# Patient Record
Sex: Male | Born: 1970 | Race: White | Hispanic: No | Marital: Single | Smoking: Current every day smoker
Health system: Southern US, Community
[De-identification: ages and names within clinical notes are randomized; demographics above are authoritative.]

## PROBLEM LIST (undated history)

## (undated) DIAGNOSIS — J45909 Unspecified asthma, uncomplicated: Secondary | ICD-10-CM

## (undated) DIAGNOSIS — I10 Essential (primary) hypertension: Secondary | ICD-10-CM

## (undated) HISTORY — PX: JOINT REPLACEMENT: SHX530

---

## 1999-05-24 ENCOUNTER — Emergency Department (HOSPITAL_COMMUNITY): Admission: EM | Admit: 1999-05-24 | Discharge: 1999-05-24 | Payer: Self-pay

## 1999-05-29 ENCOUNTER — Emergency Department (HOSPITAL_COMMUNITY): Admission: EM | Admit: 1999-05-29 | Discharge: 1999-05-29 | Payer: Self-pay | Admitting: Emergency Medicine

## 1999-09-21 ENCOUNTER — Emergency Department (HOSPITAL_COMMUNITY): Admission: EM | Admit: 1999-09-21 | Discharge: 1999-09-21 | Payer: Self-pay | Admitting: Internal Medicine

## 2001-01-22 ENCOUNTER — Encounter: Payer: Self-pay | Admitting: Emergency Medicine

## 2001-01-22 ENCOUNTER — Emergency Department (HOSPITAL_COMMUNITY): Admission: AC | Admit: 2001-01-22 | Discharge: 2001-01-23 | Payer: Self-pay

## 2001-01-23 ENCOUNTER — Encounter: Payer: Self-pay | Admitting: Emergency Medicine

## 2001-05-14 ENCOUNTER — Inpatient Hospital Stay (HOSPITAL_COMMUNITY): Admission: EM | Admit: 2001-05-14 | Discharge: 2001-05-17 | Payer: Self-pay | Admitting: Psychiatry

## 2008-11-15 ENCOUNTER — Encounter: Admission: RE | Admit: 2008-11-15 | Discharge: 2008-12-19 | Payer: Self-pay | Admitting: Rheumatology
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
## 2010-12-06 NOTE — Discharge Summary (Signed)
[facility]  Patient:    Austin Brown, Austin Brown Visit Number: [id number] MRN: [id number]          Service Type: PSY Location: 500 [id number] Attending Physician:  Rachael Fee Admit Date:  05/14/2001 Discharge Date: 05/17/2001                             Discharge Summary  IDENTIFYING DATA:  This is a 40 year old single white male voluntarily admitted for alcohol dependence requiring detoxification for medical safety, history of positive withdrawal symptoms, and alcohol impairing his ability to function and to care for his family.  MEDICATIONS: 1. Allegra. 2. Vioxx.  ALLERGIES:   AMOXICILLIN  PHYSICAL EXAMINATION:  Essentially within normal limits  LABORATORY DATA:  Essentially within normal limits except for an alcohol level of 304 then 215 then 171 prior to admission.  CMET: Within normal limits. Urine drug screen: Negative.  CBC: Within normal limits.  MENTAL STATUS EXAMINATION:  The patient was an alert, young adult male neatly dressed, somewhat intoxicated, cooperative.  Speech: Slightly slow.  Mood: Mostly pleasant.  Affect: Full.  Thought process: Goal directed.  Thought content: Negative for psychotic symptoms.  There are no suicidal, homicidal, or violent ideations.  Judgment and insight were fair to poor by history, good by formal testing.  Cognitive: Intact.  ADMITTING DIAGNOSES: Axis I:    Alcohol dependence. Axis II:   None. Axis III:  1. Asthma.            2. Ankle pain. Axis IV:   Moderate problems with primary support and other psychosocial            problems. Axis V:    40/75.  HOSPITAL COURSE:  The patient was admitted with routine p.r.n. medications. Medical medications were resumed including Allegra.  He was placed on a phenobarbital detoxification protocol and monitored for safety due to severity of alcohol abuse and history of withdrawal symptoms.  Lexapro was started for a history of mild anxiety and he tolerated this  well without side effects.  He tolerated the detoxification without significant complications.  CONDITION AT DISCHARGE:  Improved, successfully detoxified showing no evidence of withdrawal.  Mood: Euthymic.  Affect: Full.  Thought process: Goal directed.  Thought content: Negative for psychotic symptoms.  No dangerous ideation.  Judgment and insight had improved.  He reported motivation to follow up with treatment recommendations.  DISPOSITION:  He was discharged to follow up with Dr. Dub Mikes on December 5 at 3:15 and [facility] as well as attend AA.  MEDICATIONS:  He was discharged with prescriptions for: 1. Phenobarbital 15 mg one tonight and one in the a.m. and then finish. 2. Lexapro 10 mg one half q.a.m.  DISCHARGE DIAGNOSES: Axis I:    1. Alcohol dependence.            2. Status post detoxification.            3. Anxiety disorder, not otherwise specified. Axis II:   None. Axis III:  1. Asthma.            2. Ankle pain. Axis IV:   Moderate problems with primary support and other psychosocial            problems. Axis V:    55 at the time of discharge. Attending Physician:  Rachael Fee DD:  06/22/01 TD:  06/22/01 Job: [id number]

## 2014-01-29 ENCOUNTER — Emergency Department (HOSPITAL_COMMUNITY): Payer: BC Managed Care – PPO

## 2014-01-29 ENCOUNTER — Encounter (HOSPITAL_COMMUNITY): Payer: Self-pay | Admitting: Emergency Medicine

## 2014-01-29 ENCOUNTER — Emergency Department (HOSPITAL_COMMUNITY)
Admission: EM | Admit: 2014-01-29 | Discharge: 2014-01-29 | Disposition: A | Discharge status: Home / Self Care | Payer: BC Managed Care – PPO | Attending: Emergency Medicine | Admitting: Emergency Medicine

## 2014-01-29 DIAGNOSIS — S62113A Displaced fracture of triquetrum [cuneiform] bone, unspecified wrist, initial encounter for closed fracture: Secondary | ICD-10-CM | POA: Insufficient documentation

## 2014-01-29 DIAGNOSIS — IMO0002 Reserved for concepts with insufficient information to code with codable children: Secondary | ICD-10-CM | POA: Insufficient documentation

## 2014-01-29 DIAGNOSIS — F17200 Nicotine dependence, unspecified, uncomplicated: Secondary | ICD-10-CM | POA: Insufficient documentation

## 2014-01-29 DIAGNOSIS — S01512A Laceration without foreign body of oral cavity, initial encounter: Secondary | ICD-10-CM

## 2014-01-29 DIAGNOSIS — T1490XA Injury, unspecified, initial encounter: Secondary | ICD-10-CM

## 2014-01-29 DIAGNOSIS — S62112A Displaced fracture of triquetrum [cuneiform] bone, left wrist, initial encounter for closed fracture: Secondary | ICD-10-CM

## 2014-01-29 DIAGNOSIS — Z23 Encounter for immunization: Secondary | ICD-10-CM | POA: Insufficient documentation

## 2014-01-29 DIAGNOSIS — S01119A Laceration without foreign body of unspecified eyelid and periocular area, initial encounter: Secondary | ICD-10-CM | POA: Insufficient documentation

## 2014-01-29 DIAGNOSIS — Y9289 Other specified places as the place of occurrence of the external cause: Secondary | ICD-10-CM | POA: Insufficient documentation

## 2014-01-29 DIAGNOSIS — S01111A Laceration without foreign body of right eyelid and periocular area, initial encounter: Secondary | ICD-10-CM

## 2014-01-29 DIAGNOSIS — S0181XA Laceration without foreign body of other part of head, initial encounter: Secondary | ICD-10-CM

## 2014-01-29 DIAGNOSIS — S01501A Unspecified open wound of lip, initial encounter: Secondary | ICD-10-CM | POA: Insufficient documentation

## 2014-01-29 DIAGNOSIS — J45909 Unspecified asthma, uncomplicated: Secondary | ICD-10-CM | POA: Insufficient documentation

## 2014-01-29 DIAGNOSIS — S01502A Unspecified open wound of oral cavity, initial encounter: Secondary | ICD-10-CM | POA: Insufficient documentation

## 2014-01-29 DIAGNOSIS — F1010 Alcohol abuse, uncomplicated: Secondary | ICD-10-CM

## 2014-01-29 DIAGNOSIS — I10 Essential (primary) hypertension: Secondary | ICD-10-CM | POA: Insufficient documentation

## 2014-01-29 DIAGNOSIS — Y9389 Activity, other specified: Secondary | ICD-10-CM | POA: Insufficient documentation

## 2014-01-29 HISTORY — DX: Unspecified asthma, uncomplicated: J45.909

## 2014-01-29 HISTORY — DX: Essential (primary) hypertension: I10

## 2014-01-29 LAB — CBC
HCT: 43.2 % (ref 39.0–52.0)
Hemoglobin: 14.2 g/dL (ref 13.0–17.0)
MCH: 34.1 pg — ABNORMAL HIGH (ref 26.0–34.0)
MCHC: 32.9 g/dL (ref 30.0–36.0)
MCV: 103.8 fL — ABNORMAL HIGH (ref 78.0–100.0)
PLATELETS: 162 10*3/uL (ref 150–400)
RBC: 4.16 MIL/uL — ABNORMAL LOW (ref 4.22–5.81)
RDW: 13 % (ref 11.5–15.5)
WBC: 6.8 10*3/uL (ref 4.0–10.5)

## 2014-01-29 LAB — COMPREHENSIVE METABOLIC PANEL
ALBUMIN: 4.3 g/dL (ref 3.5–5.2)
ALT: 86 U/L — ABNORMAL HIGH (ref 0–53)
ANION GAP: 19 — AB (ref 5–15)
AST: 101 U/L — ABNORMAL HIGH (ref 0–37)
Alkaline Phosphatase: 91 U/L (ref 39–117)
BILIRUBIN TOTAL: 0.3 mg/dL (ref 0.3–1.2)
BUN: 11 mg/dL (ref 6–23)
CO2: 24 mEq/L (ref 19–32)
CREATININE: 0.69 mg/dL (ref 0.50–1.35)
Calcium: 9.1 mg/dL (ref 8.4–10.5)
Chloride: 103 mEq/L (ref 96–112)
GFR calc non Af Amer: >90 mL/min (ref >90)
Glucose, Bld: 131 mg/dL — ABNORMAL HIGH (ref 70–99)
Potassium: 4.1 mEq/L (ref 3.7–5.3)
Sodium: 146 mEq/L (ref 137–147)
Total Protein: 7.7 g/dL (ref 6.0–8.3)

## 2014-01-29 LAB — PROTIME-INR
INR: 0.93 (ref 0.00–1.49)
PROTHROMBIN TIME: 12.5 s (ref 11.6–15.2)

## 2014-01-29 LAB — CDS SEROLOGY

## 2014-01-29 LAB — SAMPLE TO BLOOD BANK

## 2014-01-29 LAB — ETHANOL: ALCOHOL ETHYL (B): 314 mg/dL — AB (ref 0–11)

## 2014-01-29 MED ORDER — OXYCODONE-ACETAMINOPHEN 5-325 MG PO TABS: 2.0000 | ORAL_TABLET | ORAL | Status: DC | PRN

## 2014-01-29 MED ORDER — TETANUS-DIPHTH-ACELL PERTUSSIS 5-2.5-18.5 LF-MCG/0.5 IM SUSP
0.5000 mL | Freq: Once | INTRAMUSCULAR | Status: AC
  Administered 2014-01-29: 0.5 mL via INTRAMUSCULAR
  Filled 2014-01-29: qty 0.5

## 2014-01-29 MED ORDER — MORPHINE SULFATE 4 MG/ML IJ SOLN
4.0000 mg | Freq: Once | INTRAMUSCULAR | Status: AC
  Administered 2014-01-29: 4 mg via INTRAVENOUS
  Filled 2014-01-29: qty 1
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
## 2014-01-29 NOTE — Discharge Instructions (Signed)
Absorbable Suture Repair Absorbable sutures (stitches) hold skin together so you can heal. Keep skin wounds clean and dry for the next 2 to 3 days. Then, you may gently wash your wound and dress it with an antibiotic ointment as recommended. As your wound begins to heal, the sutures are no longer needed, and they typically begin to fall off. This will take 7 to 10 days. After 10 days, if your sutures are loose, you can remove them by wiping with a clean gauze pad or a cotton ball. Do not pull your sutures out. They should wipe away easily. If after 10 days they do not easily wipe away, have your caregiver take them out. Absorbable sutures may be used deep in a wound to help hold it together. If these stitches are below the skin, the body will absorb them completely in 3 to 4 weeks.  You may need a tetanus shot if:  You cannot remember when you had your last tetanus shot.  You have never had a tetanus shot. If you get a tetanus shot, your arm may swell, get red, and feel warm to the touch. This is common and not a problem. If you need a tetanus shot and you choose not to have one, there is a rare chance of getting tetanus. Sickness from tetanus can be serious. SEEK IMMEDIATE MEDICAL CARE IF:  You have redness in the wound area.  The wound area feels hot to the touch.  You develop swelling in the wound area.  You develop pain.  There is fluid drainage from the wound. Document Released: 08/14/2004 Document Revised: 09/29/2011 Document Reviewed: 11/26/2010 [facility] Patient Information 2015 [place], [place]. This information is not intended to replace advice given to you by your health care provider. Make sure you discuss any questions you have with your health care provider.

## 2014-01-29 NOTE — Progress Notes (Signed)
Orthopedic Tech Progress Note Patient Details:  Austin Brown February 08, 1971 [id number]  Ortho Devices Type of Ortho Device: Short arm splint Ortho Device/Splint Location: short arm volar splint applied to the patients left upper extemity. wear and care of the splint is gone over verbally with patient.   Ashok Cordia 01/29/2014, 4:30 AM
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
## 2014-08-27 ENCOUNTER — Emergency Department (HOSPITAL_COMMUNITY): Payer: BLUE CROSS/BLUE SHIELD

## 2014-08-27 ENCOUNTER — Emergency Department (HOSPITAL_COMMUNITY)
Admission: EM | Admit: 2014-08-27 | Discharge: 2014-08-27 | Disposition: A | Discharge status: Home / Self Care | Payer: BLUE CROSS/BLUE SHIELD | Attending: Emergency Medicine | Admitting: Emergency Medicine

## 2014-08-27 ENCOUNTER — Encounter (HOSPITAL_COMMUNITY): Payer: Self-pay

## 2014-08-27 DIAGNOSIS — Z79899 Other long term (current) drug therapy: Secondary | ICD-10-CM | POA: Insufficient documentation

## 2014-08-27 DIAGNOSIS — I10 Essential (primary) hypertension: Secondary | ICD-10-CM | POA: Insufficient documentation

## 2014-08-27 DIAGNOSIS — F10120 Alcohol abuse with intoxication, uncomplicated: Secondary | ICD-10-CM | POA: Diagnosis present

## 2014-08-27 DIAGNOSIS — F1010 Alcohol abuse, uncomplicated: Secondary | ICD-10-CM

## 2014-08-27 DIAGNOSIS — R000 Tachycardia, unspecified: Secondary | ICD-10-CM | POA: Diagnosis not present

## 2014-08-27 DIAGNOSIS — J45909 Unspecified asthma, uncomplicated: Secondary | ICD-10-CM | POA: Insufficient documentation

## 2014-08-27 DIAGNOSIS — Z720 Tobacco use: Secondary | ICD-10-CM | POA: Insufficient documentation

## 2014-08-27 DIAGNOSIS — Z880 Allergy status to penicillin: Secondary | ICD-10-CM | POA: Diagnosis not present

## 2014-08-27 DIAGNOSIS — F10920 Alcohol use, unspecified with intoxication, uncomplicated: Secondary | ICD-10-CM

## 2014-08-27 LAB — URINALYSIS, ROUTINE W REFLEX MICROSCOPIC
Bilirubin Urine: NEGATIVE
Glucose, UA: NEGATIVE mg/dL
Ketones, ur: NEGATIVE mg/dL
Leukocytes, UA: NEGATIVE
Nitrite: NEGATIVE
Protein, ur: NEGATIVE mg/dL
SPECIFIC GRAVITY, URINE: 1.006 (ref 1.005–1.030)
UROBILINOGEN UA: 0.2 mg/dL (ref 0.0–1.0)
pH: 5 (ref 5.0–8.0)

## 2014-08-27 LAB — COMPREHENSIVE METABOLIC PANEL
ALK PHOS: 108 U/L (ref 39–117)
ALT: 126 U/L — AB (ref 0–53)
AST: 132 U/L — ABNORMAL HIGH (ref 0–37)
Albumin: 5 g/dL (ref 3.5–5.2)
Anion gap: 11 (ref 5–15)
BILIRUBIN TOTAL: 0.7 mg/dL (ref 0.3–1.2)
BUN: 11 mg/dL (ref 6–23)
CHLORIDE: 105 mmol/L (ref 96–112)
CO2: 25 mmol/L (ref 19–32)
Calcium: 9.5 mg/dL (ref 8.4–10.5)
Creatinine, Ser: 0.79 mg/dL (ref 0.50–1.35)
Glucose, Bld: 110 mg/dL — ABNORMAL HIGH (ref 70–99)
Potassium: 4.4 mmol/L (ref 3.5–5.1)
Sodium: 141 mmol/L (ref 135–145)
TOTAL PROTEIN: 8.9 g/dL — AB (ref 6.0–8.3)

## 2014-08-27 LAB — CBC
HCT: 45.1 % (ref 39.0–52.0)
Hemoglobin: 14.4 g/dL (ref 13.0–17.0)
MCH: 33.6 pg (ref 26.0–34.0)
MCHC: 31.9 g/dL (ref 30.0–36.0)
MCV: 105.1 fL — ABNORMAL HIGH (ref 78.0–100.0)
Platelets: 141 10*3/uL — ABNORMAL LOW (ref 150–400)
RBC: 4.29 MIL/uL (ref 4.22–5.81)
RDW: 13.8 % (ref 11.5–15.5)
WBC: 6.7 10*3/uL (ref 4.0–10.5)

## 2014-08-27 LAB — RAPID URINE DRUG SCREEN, HOSP PERFORMED
AMPHETAMINES: NOT DETECTED
Barbiturates: NOT DETECTED
Benzodiazepines: NOT DETECTED
COCAINE: NOT DETECTED
OPIATES: NOT DETECTED
TETRAHYDROCANNABINOL: NOT DETECTED

## 2014-08-27 LAB — ACETAMINOPHEN LEVEL: Acetaminophen (Tylenol), Serum: <10 ug/mL — ABNORMAL LOW (ref 10–30)

## 2014-08-27 LAB — SALICYLATE LEVEL: Salicylate Lvl: <4 mg/dL (ref 2.8–20.0)

## 2014-08-27 LAB — URINE MICROSCOPIC-ADD ON

## 2014-08-27 LAB — ETHANOL: ALCOHOL ETHYL (B): 464 mg/dL — AB (ref 0–9)

## 2014-08-27 MED ORDER — THIAMINE HCL 100 MG/ML IJ SOLN: 100.0000 mg | Freq: Every day | INTRAMUSCULAR | Status: DC

## 2014-08-27 MED ORDER — LORAZEPAM 2 MG/ML IJ SOLN: 0.0000 mg | Freq: Two times a day (BID) | INTRAMUSCULAR | Status: DC

## 2014-08-27 MED ORDER — LORAZEPAM 2 MG/ML IJ SOLN
0.0000 mg | Freq: Four times a day (QID) | INTRAMUSCULAR | Status: DC
Start: 2014-08-28 — End: 2014-08-28

## 2014-08-27 MED ORDER — VITAMIN B-1 100 MG PO TABS: 100.0000 mg | ORAL_TABLET | Freq: Every day | ORAL | Status: DC

## 2014-08-27 NOTE — ED Notes (Signed)
EDP made aware-patient is ambulatory with no assist

## 2014-08-27 NOTE — ED Notes (Signed)
Patient reports he is ready to get help with his alcohol addiction.  Currently intoxicated.  He reports he has been drinking since 1000.

## 2014-08-27 NOTE — BH Assessment (Signed)
Assessment completed. Consulted Darlyne Russian, PA-C who recommended inpatient treatment once pt's BAL is below 200.

## 2014-08-27 NOTE — ED Provider Notes (Signed)
CSN: [id number]     Arrival date & time 08/27/14  2109 History  This chart was scribed for non-physician practitioner, Rodolph Bong, PA-C, working with Malvin Johns, MD by Lowella Petties, ED Scribe. The patient was seen in room WTR4/WLPT4. Patient's care was started at 10:10 PM.   Chief Complaint  Patient presents with  . Alcohol Intoxication  . Alcohol Problem   The history is provided by the patient. No language interpreter was used.    Level 5 Caveat: Alcohol Intoxication  HPI Comments: Austin Brown is a 44 y.o. male who presents to the Emergency Department for alcohol detox. He states that he has consumed two bottles of wine every day for the past year. He takes Xanax every morning. He denies any previous attempts to detox or any withdrawal symptoms. He states that he vomits most every morning from anxiety, but he denies vomiting today. He denies any hallucinations, suicidal or homicidal ideations.  Patient also states that he fell on Friday, he does not remember if he had any loss of consciousness. He denies chest pain, SOB, and hematemesis.   Past Medical History  Diagnosis Date  . Asthma   . Hypertension    History reviewed. No pertinent past surgical history. History reviewed. No pertinent family history. History  Substance Use Topics  . Smoking status: Current Every Day Smoker -- 1.50 packs/day    Types: Cigarettes  . Smokeless tobacco: Current User  . Alcohol Use: 4.8 oz/week    8 Glasses of wine per week    Review of Systems  Unable to perform ROS Level 5 Caveat: Alcohol Intoxication   Allergies  Amoxicillin  Home Medications   Prior to Admission medications   Medication Sig Start Date End Date Taking? Authorizing Provider  alprazolam Duanne Moron) 2 MG tablet Take 1 mg by mouth at bedtime as needed for sleep.    Yes Historical Provider, MD  fexofenadine (ALLEGRA) 180 MG tablet Take 180 mg by mouth daily.   Yes Historical Provider, MD  oxyCODONE-acetaminophen  (PERCOCET/ROXICET) 5-325 MG per tablet Take 2 tablets by mouth every 4 (four) hours as needed for severe pain. Patient not taking: Reported on 08/27/2014 01/29/14   Sharyon Cable, MD   Triage Vitals: BP 149/107 mmHg  Pulse 128  Temp(Src) 98.6 F (37 C) (Oral)  Resp 20  SpO2 96% Physical Exam  Constitutional: He is oriented to person, place, and time. He appears well-developed and well-nourished. No distress.  Pt smells of alcohol, appears intoxicated.   HENT:  Head: Normocephalic and atraumatic.  Right Ear: External ear normal.  Left Ear: External ear normal.  Nose: Nose normal.  Mouth/Throat: Oropharynx is clear and moist.  Eyes: Conjunctivae are normal. Pupils are equal, round, and reactive to light.  Neck: Normal range of motion. Neck supple.  Cardiovascular: Tachycardia present.   Pulmonary/Chest: Effort normal.  Abdominal: Soft.  Musculoskeletal: Normal range of motion.  Neurological: He is alert and oriented to person, place, and time. Gait normal.  Moves all extremities without ataxia   Skin: Skin is warm and dry. He is not diaphoretic.  Psychiatric: He has a normal mood and affect.  Nursing note and vitals reviewed.   ED Course  Procedures (including critical care time) Medications - No data to display  DIAGNOSTIC STUDIES: Oxygen Saturation is 96% on room air, normal by my interpretation.    COORDINATION OF CARE: 10:16 PM-Discussed treatment plan which includes lab work, TTS consult, Observation in the ED, and TTS consult with  pt at bedside and pt agreed to plan.    Labs Review Labs Reviewed  ACETAMINOPHEN LEVEL - Abnormal; Notable for the following:    Acetaminophen (Tylenol), Serum <10.0 (*)    All other components within normal limits  CBC - Abnormal; Notable for the following:    MCV 105.1 (*)    Platelets 141 (*)    All other components within normal limits  COMPREHENSIVE METABOLIC PANEL - Abnormal; Notable for the following:    Glucose, Bld 110 (*)     Total Protein 8.9 (*)    AST 132 (*)    ALT 126 (*)    All other components within normal limits  ETHANOL - Abnormal; Notable for the following:    Alcohol, Ethyl (B) 464 (*)    All other components within normal limits  URINALYSIS, ROUTINE W REFLEX MICROSCOPIC - Abnormal; Notable for the following:    Hgb urine dipstick SMALL (*)    All other components within normal limits  SALICYLATE LEVEL  URINE RAPID DRUG SCREEN (HOSP PERFORMED)  URINE MICROSCOPIC-ADD ON    Imaging Review No results found.   EKG Interpretation None      MDM   Final diagnoses:  Alcohol intoxication, uncomplicated  Alcohol abuse    Filed Vitals:   08/27/14 2118  BP: 149/107  Pulse: 128  Temp: 98.6 F (37 C)  Resp: 20    Patient presenting requesting detox from alcohol. He is currently intoxicated. No neurofocal deficits on examination. He is to be tachycardic is otherwise unremarkable. Given history of fall and patient being a poor historian will obtain CT scan of head to ensure no intracranial process. Labs reviewed. CIWA protocol placed. TTS consulted for possible placement.   Patient refusing to stay in the ED for detox and completion of his work up.   Patient d/w with Dr. Tamera Punt, agrees with plan.   I personally performed the services described in this documentation, which was scribed in my presence. The recorded information has been reviewed and is accurate.      Harlow Mares, PA-C 08/28/14 [place], MD 09/06/14 [phone]

## 2014-08-27 NOTE — BH Assessment (Addendum)
Tele Assessment Note   Austin Brown is an 44 y.o. male presenting to [facility] ED requesting detox. "I need some help". "I need help with my drink". Pt denies SI, HI and AVH at this time. Pt did not report any previous suicide attempts or psychiatric hospitalizations. Pt reported that he has completed detox in the past but was unable to provide any information in regards to his sobriety. PT is currently intoxicated.  Pt reported that he drinks at least 2 bottles of wine daily. Pt also reported that he takes Xanax daily before work because it helps with his hangover.   Axis I: Alcohol intoxication, with moderate or severe use disorder  Past Medical History:  Past Medical History  Diagnosis Date  . Asthma   . Hypertension     History reviewed. No pertinent past surgical history.  Family History: History reviewed. No pertinent family history.  Social History:  reports that he has been smoking Cigarettes.  He has been smoking about 1.50 packs per day. He uses smokeless tobacco. He reports that he drinks about 4.8 oz of alcohol per week. He reports that he does not use illicit drugs.  Additional Social History:  Alcohol / Drug Use Pain Medications: Xanax History of alcohol / drug use?: Yes Withdrawal Symptoms: Patient aware of relationship between substance abuse and physical/medical complications Substance #1 Name of Substance 1: Alcohol  1 - Age of First Use: 19 1 - Amount (size/oz): 2 bottles of wine  1 - Frequency: daily  1 - Duration: ongoing  1 - Last Use / Amount: 08-27-14 Substance #2 Name of Substance 2: Xanax 2 - Age of First Use: 41  2 - Amount (size/oz): "1pill" 2 - Frequency: daily  2 - Duration: ongoing   CIWA: CIWA-Ar BP: (!) 149/107 mmHg Pulse Rate: (!) 128 COWS:    PATIENT STRENGTHS: (choose at least two) Average or above average intelligence Motivation for treatment/growth  Allergies:  Allergies  Allergen Reactions  . Amoxicillin Other (See Comments)    unknown     Home Medications:  (Not in a hospital admission)  OB/GYN Status:  No LMP for male patient.  General Assessment Data Location of Assessment: [facility] ED Is this a Tele or Face-to-Face Assessment?: Face-to-Face Is this an Initial Assessment or a Re-assessment for this encounter?: Initial Assessment Living Arrangements: Alone Can pt return to current living arrangement?: Yes Admission Status: Voluntary Is patient capable of signing voluntary admission?: Yes Transfer from: Home Referral Source: Self/Family/Friend     [place] Living Arrangements: Alone Name of Psychiatrist: No provider reported at this time.  Name of Therapist: No provider reported at this time.   Education Status Is patient currently in school?: No  Risk to self with the past 6 months Suicidal Ideation: No Suicidal Intent: No Is patient at risk for suicide?: No Suicidal Plan?: No Access to Means: No What has been your use of drugs/alcohol within the last 12 months?: Pt reports daily alcohol use.  Previous Attempts/Gestures: No How many times?: 0 Other Self Harm Risks: No recent self harm risk identified at this time  Triggers for Past Attempts: None known Intentional Self Injurious Behavior: None Family Suicide History: No Recent stressful life event(s):  (Unable to assess at this time. ) Persecutory voices/beliefs?: No Depression:  (Unable to assess at this time. ) Substance abuse history and/or treatment for substance abuse?: Yes Suicide prevention information given to non-admitted patients: Not applicable  Risk to Others within the past 6 months  Homicidal Ideation: No Thoughts of Harm to Others: No Current Homicidal Intent: No Current Homicidal Plan: No Access to Homicidal Means: No Identified Victim: NA History of harm to others?: No Assessment of Violence: On admission Violent Behavior Description: No violent behaviors observed. Pt is cooperative at this time.  Does patient have  access to weapons?: Yes (Comment) ("I have a shot gun in my apartment". ) Criminal Charges Pending?: No Does patient have a court date: No  Psychosis Hallucinations: None noted Delusions: None noted  Mental Status Report Appear/Hygiene: Unremarkable Eye Contact: Good Motor Activity: Freedom of movement Speech: Logical/coherent Level of Consciousness: Alert Mood: Euphoric Affect: Appropriate to circumstance Anxiety Level: None Thought Processes: Coherent, Relevant Judgement: Impaired (PT is intoxicated. ) Orientation: Unable to assess Obsessive Compulsive Thoughts/Behaviors: None  Cognitive Functioning Concentration: Decreased Memory: Recent Intact IQ: Average Insight: Poor Impulse Control: Poor Appetite: Good Weight Loss: 0 Weight Gain: 0 Sleep: No Change Total Hours of Sleep: 8 ("when I am drinking".) Vegetative Symptoms: None  ADLScreening [facility]) Patient's cognitive ability adequate to safely complete daily activities?: Yes Patient able to express need for assistance with ADLs?: Yes Independently performs ADLs?: Yes (appropriate for developmental age)  Prior Inpatient Therapy Prior Inpatient Therapy: Yes Prior Therapy Dates: 2002 Prior Therapy Facilty/Provider(s): [facility]  Reason for Treatment: Substance Abuse   Prior Outpatient Therapy Prior Outpatient Therapy: No  ADL Screening (condition at time of admission) Patient's cognitive ability adequate to safely complete daily activities?: Yes Patient able to express need for assistance with ADLs?: Yes Independently performs ADLs?: Yes (appropriate for developmental age)             Advance Directives (For Healthcare) Does patient have an advance directive?: No Would patient like information on creating an advanced directive?: No - patient declined information    Additional Information 1:1 In Past 12 Months?: No CIRT Risk: No Elopement Risk: Yes     Disposition: Inpatient  treatment.  Disposition Initial Assessment Completed for this Encounter: Yes  Austin Brown S 08/27/2014 10:38 PM

## 2014-08-27 NOTE — ED Notes (Signed)
Patient at Nurse's desk verbally abusing staff and cursing-attempting to light a cigarette, off duty at bedside, patient declines to go back in to room-patient escorted off property by [employer] and security

## 2014-08-28 ENCOUNTER — Encounter (HOSPITAL_COMMUNITY): Payer: Self-pay | Admitting: Emergency Medicine

## 2014-08-28 ENCOUNTER — Emergency Department (HOSPITAL_COMMUNITY)
Admission: EM | Admit: 2014-08-28 | Discharge: 2014-08-28 | Discharge status: Against Medical Advice | Payer: BLUE CROSS/BLUE SHIELD | Attending: Emergency Medicine | Admitting: Emergency Medicine

## 2014-08-28 DIAGNOSIS — Z720 Tobacco use: Secondary | ICD-10-CM | POA: Insufficient documentation

## 2014-08-28 DIAGNOSIS — Z880 Allergy status to penicillin: Secondary | ICD-10-CM | POA: Insufficient documentation

## 2014-08-28 DIAGNOSIS — I10 Essential (primary) hypertension: Secondary | ICD-10-CM | POA: Insufficient documentation

## 2014-08-28 DIAGNOSIS — J45909 Unspecified asthma, uncomplicated: Secondary | ICD-10-CM | POA: Diagnosis not present

## 2014-08-28 DIAGNOSIS — Z79899 Other long term (current) drug therapy: Secondary | ICD-10-CM | POA: Diagnosis not present

## 2014-08-28 DIAGNOSIS — F1010 Alcohol abuse, uncomplicated: Secondary | ICD-10-CM | POA: Diagnosis present

## 2014-08-28 DIAGNOSIS — F1310 Sedative, hypnotic or anxiolytic abuse, uncomplicated: Secondary | ICD-10-CM | POA: Diagnosis not present

## 2014-08-28 LAB — ACETAMINOPHEN LEVEL: Acetaminophen (Tylenol), Serum: <10 ug/mL — ABNORMAL LOW (ref 10–30)

## 2014-08-28 LAB — BASIC METABOLIC PANEL
Anion gap: 12 (ref 5–15)
BUN: 10 mg/dL (ref 6–23)
CO2: 26 mmol/L (ref 19–32)
CREATININE: 0.68 mg/dL (ref 0.50–1.35)
Calcium: 9.4 mg/dL (ref 8.4–10.5)
Chloride: 104 mmol/L (ref 96–112)
GFR calc Af Amer: >90 mL/min (ref >90)
GLUCOSE: 108 mg/dL — AB (ref 70–99)
POTASSIUM: 4.4 mmol/L (ref 3.5–5.1)
SODIUM: 142 mmol/L (ref 135–145)

## 2014-08-28 LAB — RAPID URINE DRUG SCREEN, HOSP PERFORMED
Amphetamines: NOT DETECTED
Barbiturates: NOT DETECTED
Benzodiazepines: POSITIVE — AB
Cocaine: NOT DETECTED
OPIATES: NOT DETECTED
Tetrahydrocannabinol: NOT DETECTED

## 2014-08-28 LAB — CBC
HCT: 44.5 % (ref 39.0–52.0)
HEMOGLOBIN: 14.5 g/dL (ref 13.0–17.0)
MCH: 34.4 pg — AB (ref 26.0–34.0)
MCHC: 32.6 g/dL (ref 30.0–36.0)
MCV: 105.7 fL — ABNORMAL HIGH (ref 78.0–100.0)
Platelets: 132 10*3/uL — ABNORMAL LOW (ref 150–400)
RBC: 4.21 MIL/uL — ABNORMAL LOW (ref 4.22–5.81)
RDW: 13.9 % (ref 11.5–15.5)
WBC: 6 10*3/uL (ref 4.0–10.5)

## 2014-08-28 LAB — I-STAT TROPONIN, ED: Troponin i, poc: 0 ng/mL (ref 0.00–0.08)

## 2014-08-28 LAB — ETHANOL: ALCOHOL ETHYL (B): 303 mg/dL — AB (ref 0–9)

## 2014-08-28 LAB — SALICYLATE LEVEL: Salicylate Lvl: <4 mg/dL (ref 2.8–20.0)

## 2014-08-28 MED ORDER — VITAMIN B-1 100 MG PO TABS: 100.0000 mg | ORAL_TABLET | Freq: Every day | ORAL | Status: DC

## 2014-08-28 MED ORDER — LORAZEPAM 1 MG PO TABS: 0.0000 mg | ORAL_TABLET | Freq: Four times a day (QID) | ORAL | Status: DC

## 2014-08-28 MED ORDER — THIAMINE HCL 100 MG/ML IJ SOLN: 100.0000 mg | Freq: Every day | INTRAMUSCULAR | Status: DC

## 2014-08-28 MED ORDER — LORAZEPAM 1 MG PO TABS: 0.0000 mg | ORAL_TABLET | Freq: Two times a day (BID) | ORAL | Status: DC

## 2014-08-28 NOTE — ED Notes (Signed)
Pt requesting help for detox from ETOH. Pt states that he last use was 10am this morning.

## 2014-08-28 NOTE — ED Notes (Signed)
Pt is laughing and making suggestive remarks to EMT and Phlebotomist. I asked pt to hold such remarks, patient is laughing and stating that he is here for rehab and refuses to be sent anywhere else, such as across the street.  When pt chest hair was trimmed he asked why the male who was helping him before could not do so.  Pt states he has been here multiple times and that he is not leaving today.  Pt also smells of strong alcohol odor.

## 2014-08-28 NOTE — BH Assessment (Signed)
Tele Assessment Note   Austin Brown is an 44 y.o. male who was brought to the ED by his mother for alcohol detox. Pt was still intoxicated during the assessment and was slurring his speech and laughing at innapropriate times. He states that he is here "because he's an alcoholic" and he wants to get treatment. He states that he drinks 2 large bottles a wine daily and takes xanex in the morning to help with anxiety from his hangovers so he can go to work.(The xanex he takes is not prescribed, he states he "knows where to get them", UDS + for benzos). Pt denies SI, HI, A/V hallucinations at this time. He says the only time he has received treatment for his alcohol abuse was in 2002 at [facility] where he stayed 3 days. He says that it was not helpful and he went out and drank first thing when he got out. He says that he has been drinking heavily daily for about 5 years. Pt admits that his drinking causes him anxiety.   Disposition- Per Waylan Boga, NP pt is to be observed overnight and reevaluated in the AM.   Axis I: 291.0 Alcohol Intoxication with moderate or severe use disorder Axis II: Deferred Axis III:  Past Medical History  Diagnosis Date  . Asthma   . Hypertension    Axis IV: other psychosocial or environmental problems and problems with primary support group Axis V: 41-50 serious symptoms  Past Medical History:  Past Medical History  Diagnosis Date  . Asthma   . Hypertension     History reviewed. No pertinent past surgical history.  Family History: No family history on file.  Social History:  reports that he has been smoking Cigarettes.  He has been smoking about 1.50 packs per day. He uses smokeless tobacco. He reports that he drinks about 4.8 oz of alcohol per week. He reports that he does not use illicit drugs.  Additional Social History:  Alcohol / Drug Use Prescriptions: Abuses xanex History of alcohol / drug use?: Yes Negative Consequences of Use:  Personal relationships, Financial, Work / School Withdrawal Symptoms:  (currently intoxicated) Substance #1 Name of Substance 1: Alcohol 1 - Age of First Use: 21 1 - Amount (size/oz): 2 large wine bottles 1 - Frequency: daily 1 - Duration: at night 1 - Last Use / Amount: this morning Substance #2 Name of Substance 2: Xanex 2 - Age of First Use: UTA 2 - Amount (size/oz): <1 mg.  2 - Frequency: daily 2 - Duration: in the am  2 - Last Use / Amount: yesterday  CIWA: CIWA-Ar BP: 161/100 mmHg Pulse Rate: 118 COWS:    PATIENT STRENGTHS: (choose at least two) General fund of knowledge Supportive family/friends  Allergies:  Allergies  Allergen Reactions  . Amoxicillin Other (See Comments)    unknown    Home Medications:  (Not in a hospital admission)  OB/GYN Status:  No LMP for male patient.  General Assessment Data Location of Assessment: [facility] ED Is this a Tele or Face-to-Face Assessment?: Tele Assessment Is this an Initial Assessment or a Re-assessment for this encounter?: Initial Assessment Living Arrangements: Alone Can pt return to current living arrangement?: Yes Admission Status: Voluntary Is patient capable of signing voluntary admission?: Yes Transfer from: Home Referral Source: Self/Family/Friend     [place] Living Arrangements: Alone Name of Psychiatrist:  (None) Name of Therapist: None  Education Status Is patient currently in school?: No  Risk to  self with the past 6 months Suicidal Ideation: No Suicidal Intent: No Is patient at risk for suicide?: No Suicidal Plan?: No Access to Means: No What has been your use of drugs/alcohol within the last 12 months?: Reports heavy daily drinking for 5 years and xanex use Previous Attempts/Gestures: No How many times?: 0 Other Self Harm Risks: None Triggers for Past Attempts: None known Intentional Self Injurious Behavior: None Family Suicide History: No Recent stressful life event(s): Other  (Comment) (Drinking causus anxiety, dad stresses him out) Persecutory voices/beliefs?: No Depression: No Substance abuse history and/or treatment for substance abuse?: Yes Suicide prevention information given to non-admitted patients: Not applicable  Risk to Others within the past 6 months Homicidal Ideation: No Thoughts of Harm to Others: No Current Homicidal Intent: No Current Homicidal Plan: No Access to Homicidal Means: No Identified Victim: N/A History of harm to others?: No Assessment of Violence: None Noted Violent Behavior Description: None noted Does patient have access to weapons?: Yes (Comment) (shot gun in apartment) Criminal Charges Pending?: No Does patient have a court date: No  Psychosis Hallucinations: None noted Delusions: None noted  Mental Status Report Appear/Hygiene: Disheveled Eye Contact: Fair Motor Activity: Freedom of movement Speech: Slurred Level of Consciousness: Alert Mood: Labile Affect: Appropriate to circumstance Anxiety Level: Minimal Thought Processes: Coherent Judgement: Impaired Orientation: Person, Place, Time, Situation Obsessive Compulsive Thoughts/Behaviors: None  Cognitive Functioning Concentration: Decreased Memory: Recent Intact, Remote Intact IQ: Average Insight: Poor Impulse Control: Poor Appetite: Good Weight Loss: 0 Weight Gain: 0 Sleep: No Change Total Hours of Sleep: 8 Vegetative Symptoms: None  ADLScreening [facility]) Patient's cognitive ability adequate to safely complete daily activities?: Yes Patient able to express need for assistance with ADLs?: Yes Independently performs ADLs?: Yes (appropriate for developmental age)  Prior Inpatient Therapy Prior Inpatient Therapy: Yes Prior Therapy Dates:  (2002) Prior Therapy Facilty/Provider(s): [facility] Reason for Treatment: Substance abuse  Prior Outpatient Therapy Prior Outpatient Therapy: No  ADL Screening (condition  at time of admission) Patient's cognitive ability adequate to safely complete daily activities?: Yes Is the patient deaf or have difficulty hearing?: No Does the patient have difficulty seeing, even when wearing glasses/contacts?: No Does the patient have difficulty concentrating, remembering, or making decisions?: No Patient able to express need for assistance with ADLs?: Yes Does the patient have difficulty dressing or bathing?: No Independently performs ADLs?: Yes (appropriate for developmental age) Does the patient have difficulty walking or climbing stairs?: No Weakness of Legs: None Weakness of Arms/Hands: None  Home Assistive Devices/Equipment Home Assistive Devices/Equipment: None    Abuse/Neglect Assessment (Assessment to be complete while patient is alone) Physical Abuse:  (UTA) Verbal Abuse:  (UTA) Sexual Abuse:  (UTA) Exploitation of patient/patient's resources:  (UTA) Self-Neglect:  (UTA) Values / Beliefs Cultural Requests During Hospitalization: None Spiritual Requests During Hospitalization: None Consults Spiritual Care Consult Needed: No Advance Directives (For Healthcare) Does patient have an advance directive?: No Would patient like information on creating an advanced directive?: No - patient declined information    Additional Information 1:1 In Past 12 Months?: No CIRT Risk: No Elopement Risk: No Does patient have medical clearance?: No     Disposition:  Disposition Initial Assessment Completed for this Encounter: Yes Disposition of Patient: Other dispositions Other disposition(s):  (observe overnight and reevaluate)  Terry Abila 08/28/2014 4:09 PM

## 2014-08-28 NOTE — ED Notes (Signed)
Patient states last drink was about 2 hours ago.

## 2014-08-28 NOTE — ED Notes (Signed)
PT LEFT AMA

## 2014-08-28 NOTE — ED Provider Notes (Signed)
CSN: [id number]     Arrival date & time 08/28/14  1115 History   First MD Initiated Contact with Patient 08/28/14 1404     Chief Complaint  Patient presents with  . detox      (Consider location/radiation/quality/duration/timing/severity/associated sxs/prior Treatment) HPI Patient presents to the emergency department requesting alcohol detox.  Patient states that he would like to get detox from alcohol.  He said been several years since he has gone for detox.  The patient states that he does not have any chest pain Shortness of breath, hallucinations, nausea, vomiting, weakness, dizziness, back pain, neck pain, abdominal pain, or syncope.  The patient states that nothing seems to make his condition, better or worse.  He does take a Xanax to help with any withdrawal symptoms Past Medical History  Diagnosis Date  . Asthma   . Hypertension    History reviewed. No pertinent past surgical history. No family history on file. History  Substance Use Topics  . Smoking status: Current Every Day Smoker -- 1.50 packs/day    Types: Cigarettes  . Smokeless tobacco: Current User  . Alcohol Use: 4.8 oz/week    8 Glasses of wine per week    Review of Systems   All other systems negative except as documented in the HPI. All pertinent positives and negatives as reviewed in the HPI. Allergies  Amoxicillin  Home Medications   Prior to Admission medications   Medication Sig Start Date End Date Taking? Authorizing Provider  alprazolam Duanne Moron) 2 MG tablet Take 1 mg by mouth at bedtime as needed for sleep.    Yes Historical Provider, MD  fexofenadine (ALLEGRA) 180 MG tablet Take 180 mg by mouth daily as needed for allergies or rhinitis.   Yes Historical Provider, MD  fexofenadine (ALLEGRA) 180 MG tablet Take 180 mg by mouth daily.    Historical Provider, MD  oxyCODONE-acetaminophen (PERCOCET/ROXICET) 5-325 MG per tablet Take 2 tablets by mouth every 4 (four) hours as needed for severe pain. Patient  not taking: Reported on 08/27/2014 01/29/14   Sharyon Cable, MD   BP 161/100 mmHg  Pulse 118  Temp(Src) 99 F (37.2 C) (Oral)  Resp 18  SpO2 95% Physical Exam  Constitutional: He is oriented to person, place, and time. He appears well-developed and well-nourished.  HENT:  Head: Normocephalic and atraumatic.  Eyes: Pupils are equal, round, and reactive to light.  Neck: Normal range of motion. Neck supple.  Cardiovascular: Normal rate, regular rhythm and normal heart sounds.  Exam reveals no gallop and no friction rub.   No murmur heard. Pulmonary/Chest: Effort normal and breath sounds normal. No respiratory distress.  Neurological: He is alert and oriented to person, place, and time. He exhibits normal muscle tone. Coordination normal.  Skin: Skin is warm and dry. No rash noted. No erythema.  Nursing note and vitals reviewed.   ED Course  Procedures (including critical care time) Labs Review Labs Reviewed  CBC - Abnormal; Notable for the following:    RBC 4.21 (*)    MCV 105.7 (*)    MCH 34.4 (*)    Platelets 132 (*)    All other components within normal limits  BASIC METABOLIC PANEL - Abnormal; Notable for the following:    Glucose, Bld 108 (*)    All other components within normal limits  ACETAMINOPHEN LEVEL - Abnormal; Notable for the following:    Acetaminophen (Tylenol), Serum <10.0 (*)    All other components within normal limits  ETHANOL -  Abnormal; Notable for the following:    Alcohol, Ethyl (B) 303 (*)    All other components within normal limits  URINE RAPID DRUG SCREEN (HOSP PERFORMED) - Abnormal; Notable for the following:    Benzodiazepines POSITIVE (*)    All other components within normal limits  SALICYLATE LEVEL  I-STAT TROPOININ, ED    Imaging Review No results found.   EKG Interpretation   Date/Time:  Monday August 28 2014 12:59:06 EST Ventricular Rate:  101 PR Interval:  205 QRS Duration: 85 QT Interval:  325 QTC Calculation: 421 R  Axis:   77 Text Interpretation:  Sinus tachycardia Borderline prolonged PR interval  Probable anteroseptal infarct, old No previous tracing Confirmed by BEATON   MD, ROBERT [phone]) on 08/28/2014 3:10:09 PM      Patient be placed in the TCU for psych evaluation for alcohol detox    Brent General, PA-C 08/28/14 [place], MD 08/29/14 [phone]

## 2014-10-20 ENCOUNTER — Emergency Department (HOSPITAL_COMMUNITY)
Admission: EM | Admit: 2014-10-20 | Discharge: 2014-10-20 | Disposition: A | Discharge status: Home / Self Care | Payer: BLUE CROSS/BLUE SHIELD | Attending: Emergency Medicine | Admitting: Emergency Medicine

## 2014-10-20 ENCOUNTER — Encounter (HOSPITAL_COMMUNITY): Payer: Self-pay | Admitting: Emergency Medicine

## 2014-10-20 DIAGNOSIS — Y9389 Activity, other specified: Secondary | ICD-10-CM | POA: Diagnosis not present

## 2014-10-20 DIAGNOSIS — Y9289 Other specified places as the place of occurrence of the external cause: Secondary | ICD-10-CM | POA: Diagnosis not present

## 2014-10-20 DIAGNOSIS — J45909 Unspecified asthma, uncomplicated: Secondary | ICD-10-CM | POA: Insufficient documentation

## 2014-10-20 DIAGNOSIS — Z79899 Other long term (current) drug therapy: Secondary | ICD-10-CM | POA: Diagnosis not present

## 2014-10-20 DIAGNOSIS — Y998 Other external cause status: Secondary | ICD-10-CM | POA: Diagnosis not present

## 2014-10-20 DIAGNOSIS — I10 Essential (primary) hypertension: Secondary | ICD-10-CM | POA: Insufficient documentation

## 2014-10-20 DIAGNOSIS — S51812A Laceration without foreign body of left forearm, initial encounter: Secondary | ICD-10-CM | POA: Diagnosis present

## 2014-10-20 DIAGNOSIS — Y288XXA Contact with other sharp object, undetermined intent, initial encounter: Secondary | ICD-10-CM | POA: Insufficient documentation

## 2014-10-20 DIAGNOSIS — Z720 Tobacco use: Secondary | ICD-10-CM | POA: Insufficient documentation

## 2014-10-20 DIAGNOSIS — Z880 Allergy status to penicillin: Secondary | ICD-10-CM | POA: Diagnosis not present

## 2014-10-20 MED ORDER — LIDOCAINE-EPINEPHRINE (PF) 1 %-1:200000 IJ SOLN
INTRAMUSCULAR | Status: AC
  Filled 2014-10-20: qty 30

## 2014-10-20 MED ORDER — LIDOCAINE-EPINEPHRINE (PF) 2 %-1:200000 IJ SOLN
INTRAMUSCULAR | Status: DC
Start: 2014-10-20 — End: 2014-10-20
  Filled 2014-10-20: qty 20

## 2014-10-20 MED ORDER — LIDOCAINE-EPINEPHRINE (PF) 2 %-1:200000 IJ SOLN
10.0000 mL | Freq: Once | INTRAMUSCULAR | Status: AC
  Administered 2014-10-20: 10 mL

## 2014-10-20 NOTE — ED Notes (Signed)
Pt from home c/o left forearm laceration from a porcelain toilet.  Bleeding controlled at this time.

## 2014-10-20 NOTE — ED Notes (Addendum)
Pt reports cutting his L medial FA with a porcelain toilet while replacing it today.  Pt is able to move his L digits.  No sensory deficits noted at this time.

## 2014-10-20 NOTE — ED Provider Notes (Signed)
CSN: [id number]     Arrival date & time 10/20/14  1810 History  This chart was scribed for non-physician practitioner Charlann Lange, PA-C working with Quintella Reichert, MD by Rayfield Citizen, ED Scribe. This patient was seen in room [facility] and the patient's care was started at 6:37 PM.    Chief Complaint  Patient presents with  . Extremity Laceration   The history is provided by the patient.    HPI Comments: Austin Brown is a 44 y.o. male with past medical history of HTN, asthma who presents to the Emergency Department complaining of laceration to the left forearm. Patient explains he was working to repair the porcelain portion of a toilet and accidentally sliced into the volar aspect of his left forearm. Bleeding is controlled at present.   Past Medical History  Diagnosis Date  . Asthma   . Hypertension    History reviewed. No pertinent past surgical history. No family history on file. History  Substance Use Topics  . Smoking status: Current Every Day Smoker -- 1.50 packs/day    Types: Cigarettes  . Smokeless tobacco: Current User  . Alcohol Use: 4.8 oz/week    8 Glasses of wine per week    Review of Systems  Skin: Positive for wound.    Allergies  Amoxicillin  Home Medications   Prior to Admission medications   Medication Sig Start Date End Date Taking? Authorizing Provider  alprazolam Duanne Moron) 2 MG tablet Take 1 mg by mouth at bedtime as needed for sleep.     Historical Provider, MD  fexofenadine (ALLEGRA) 180 MG tablet Take 180 mg by mouth daily.    Historical Provider, MD  fexofenadine (ALLEGRA) 180 MG tablet Take 180 mg by mouth daily as needed for allergies or rhinitis.    Historical Provider, MD  oxyCODONE-acetaminophen (PERCOCET/ROXICET) 5-325 MG per tablet Take 2 tablets by mouth every 4 (four) hours as needed for severe pain. Patient not taking: Reported on 08/27/2014 01/29/14   Ripley Fraise, MD   BP 136/83 mmHg  Pulse 100  Resp 18  SpO2 93% Physical Exam   Constitutional: He is oriented to person, place, and time. He appears well-developed and well-nourished.  HENT:  Head: Normocephalic and atraumatic.  Neck: No tracheal deviation present.  Cardiovascular: Normal rate.   Pulmonary/Chest: Effort normal.  Neurological: He is alert and oriented to person, place, and time.  Skin: Skin is warm and dry.  Psychiatric: He has a normal mood and affect. His behavior is normal.  Nursing note and vitals reviewed.   ED Course  Procedures   DIAGNOSTIC STUDIES: Oxygen Saturation is 93% on RA, low by my interpretation.    COORDINATION OF CARE: 6:40 PM Discussed treatment plan with pt at bedside and pt agreed to plan.   Labs Review Labs Reviewed - No data to display  Imaging Review No results found.   EKG Interpretation None     LACERATION REPAIR Performed by: Charlann Lange A Authorized by: Charlann Lange A Consent: Verbal consent obtained. Risks and benefits: risks, benefits and alternatives were discussed Consent given by: patient Patient identity confirmed: provided demographic data Prepped and Draped in normal sterile fashion Wound explored  Laceration Location: left forearm  Laceration Length: 3cm  No Foreign Bodies seen or palpated  Anesthesia: local infiltration  Local anesthetic: lidocaine 2% w/ epinephrine  Anesthetic total: 2 ml  Irrigation method: syringe Amount of cleaning: standard  Skin closure: 3-0 prolene  Number of sutures: 6  Technique: simple interrupted  Patient  tolerance: Patient tolerated the procedure well with no immediate complications.  MDM   Final diagnoses:  None  1. Laceration left forearm  Uncomplicated sutured repair of laceration.   I personally performed the services described in this documentation, which was scribed in my presence. The recorded information has been reviewed and is accurate.        Charlann Lange, PA-C 10/20/14 [place], MD 10/20/14 [phone]

## 2014-10-20 NOTE — Discharge Instructions (Signed)
Sutured Wound Care Sutures are stitches that can be used to close wounds. Wound care helps prevent pain and infection.  HOME CARE INSTRUCTIONS   Rest and elevate the injured area until all the pain and swelling are gone.  Only take over-the-counter or prescription medicines for pain, discomfort, or fever as directed by your caregiver.  After 48 hours, gently wash the area with mild soap and water once a day, or as directed. Rinse off the soap. Pat the area dry with a clean towel. Do not rub the wound. This may cause bleeding.  Follow your caregiver's instructions for how often to change the bandage (dressing). Stop using a dressing after 2 days or after the wound stops draining.  If the dressing sticks, moisten it with soapy water and gently remove it.  Apply ointment on the wound as directed.  Avoid stretching a sutured wound.  Drink enough fluids to keep your urine clear or pale yellow.  Follow up with your caregiver for suture removal as directed.  Use sunscreen on your wound for the next 3 to 6 months so the scar will not darken. SEEK IMMEDIATE MEDICAL CARE IF:   Your wound becomes red, swollen, hot, or tender.  You have increasing pain in the wound.  You have a red streak that extends from the wound.  There is pus coming from the wound.  You have a fever.  You have shaking chills.  There is a bad smell coming from the wound.  You have persistent bleeding from the wound. MAKE SURE YOU:   Understand these instructions.  Will watch your condition.  Will get help right away if you are not doing well or get worse. Document Released: 08/14/2004 Document Revised: 09/29/2011 Document Reviewed: 11/10/2010 [facility] Patient Information 2015 [place], [place]. This information is not intended to replace advice given to you by your health care provider. Make sure you discuss any questions you have with your health care provider.

## 2015-09-07 ENCOUNTER — Other Ambulatory Visit: Payer: Self-pay | Admitting: Internal Medicine

## 2015-09-07 DIAGNOSIS — R945 Abnormal results of liver function studies: Principal | ICD-10-CM

## 2015-09-07 DIAGNOSIS — R7989 Other specified abnormal findings of blood chemistry: Secondary | ICD-10-CM

## 2015-09-18 ENCOUNTER — Ambulatory Visit
Admission: RE | Admit: 2015-09-18 | Discharge: 2015-09-18 | Disposition: A | Discharge status: Home / Self Care | Payer: BLUE CROSS/BLUE SHIELD | Source: Ambulatory Visit | Attending: Internal Medicine | Admitting: Internal Medicine

## 2015-09-18 DIAGNOSIS — R945 Abnormal results of liver function studies: Principal | ICD-10-CM

## 2015-09-18 DIAGNOSIS — R7989 Other specified abnormal findings of blood chemistry: Secondary | ICD-10-CM

## 2015-11-10 IMAGING — CR DG CHEST 1V PORT
1 series · 1 of 1 positions shown · non-contrast
Comparison: None.

CLINICAL DATA: (Not provided)

EXAM:
PORTABLE CHEST - 1 VIEW

[AP]
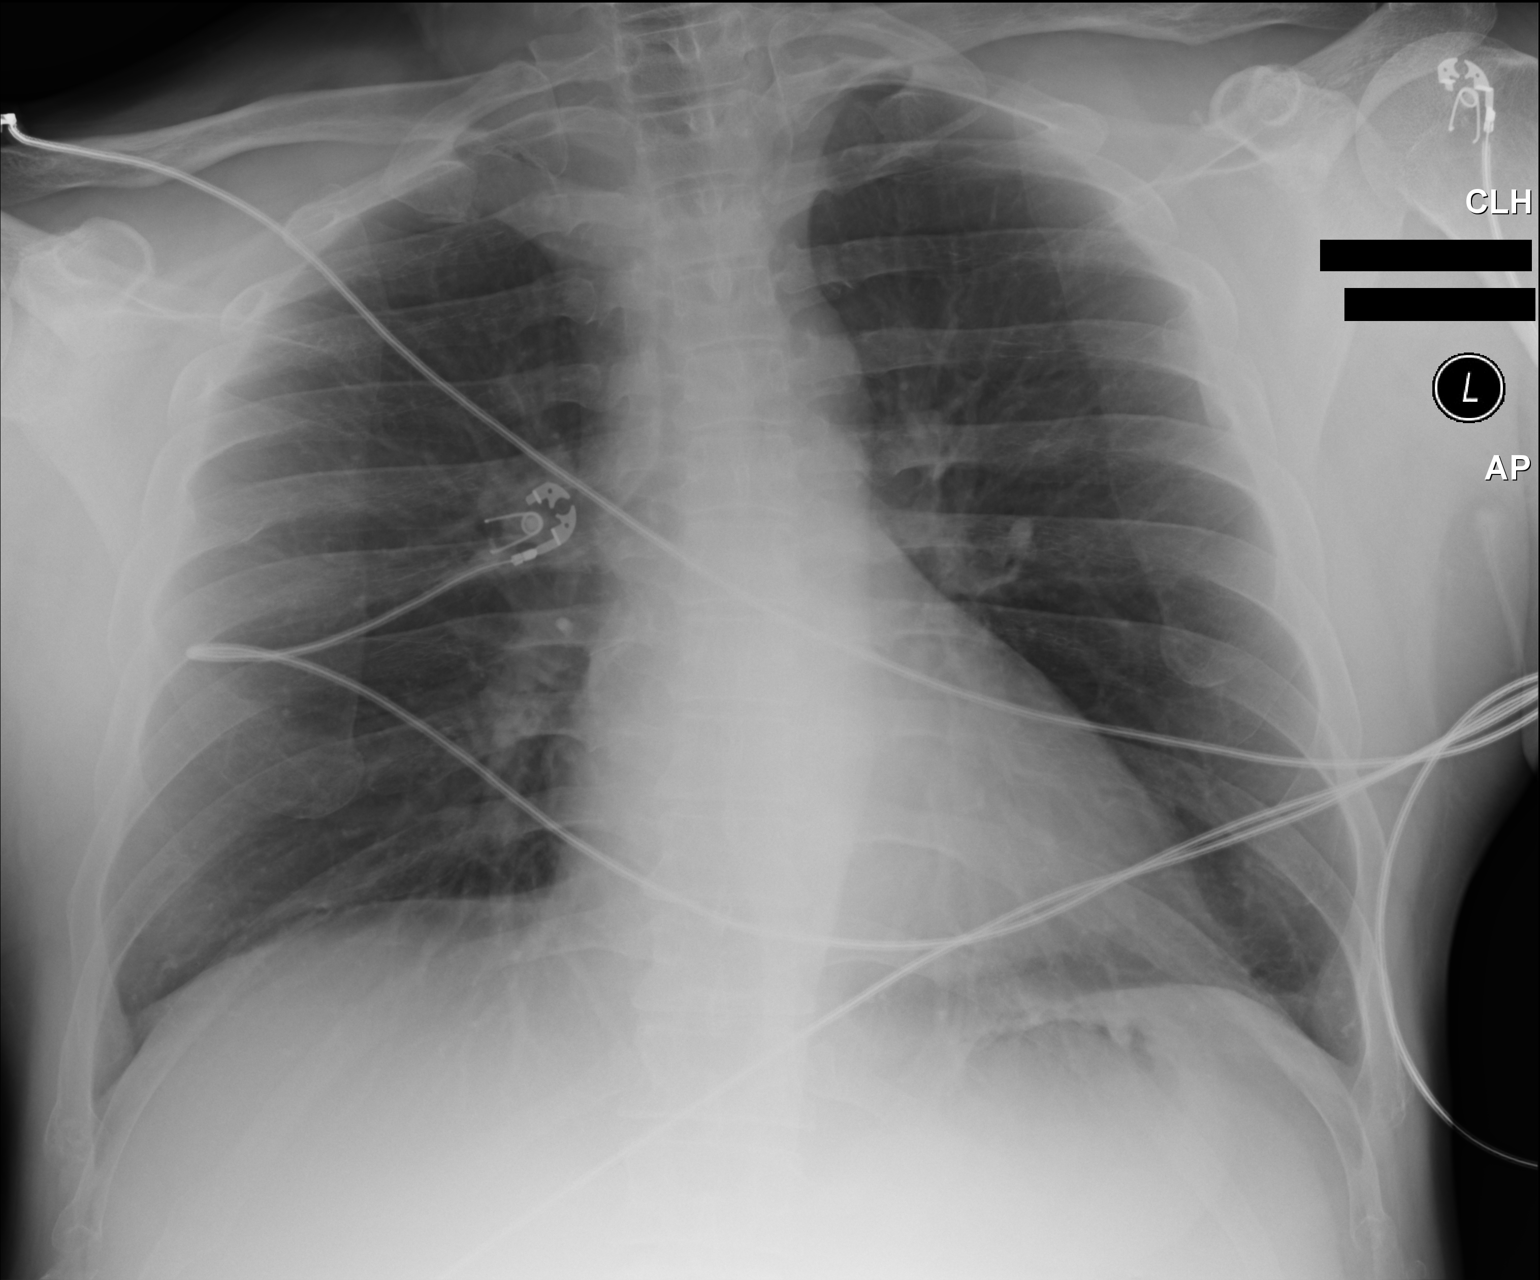

[1 of 1 positions shown; findings below may reference images not displayed]

FINDINGS: The heart size and mediastinal contours are within normal limits.
Both lungs are clear. The visualized skeletal structures are
unremarkable. Multiple EKG lines overlie the patient and may obscure
subtle underlying pathology.
IMPRESSION: No acute cardiopulmonary process.

  By: Bino Tiger

## 2016-01-28 DIAGNOSIS — M7711 Lateral epicondylitis, right elbow: Secondary | ICD-10-CM | POA: Diagnosis not present

## 2016-01-28 DIAGNOSIS — H04209 Unspecified epiphora, unspecified lacrimal gland: Secondary | ICD-10-CM | POA: Diagnosis not present

## 2016-01-28 DIAGNOSIS — L719 Rosacea, unspecified: Secondary | ICD-10-CM | POA: Diagnosis not present

## 2016-09-03 DIAGNOSIS — E8809 Other disorders of plasma-protein metabolism, not elsewhere classified: Secondary | ICD-10-CM | POA: Diagnosis not present

## 2016-09-03 DIAGNOSIS — Z13220 Encounter for screening for lipoid disorders: Secondary | ICD-10-CM | POA: Diagnosis not present

## 2016-09-03 DIAGNOSIS — R740 Nonspecific elevation of levels of transaminase and lactic acid dehydrogenase [LDH]: Secondary | ICD-10-CM | POA: Diagnosis not present

## 2016-09-03 DIAGNOSIS — Z0000 Encounter for general adult medical examination without abnormal findings: Secondary | ICD-10-CM | POA: Diagnosis not present

## 2016-09-10 DIAGNOSIS — Z0001 Encounter for general adult medical examination with abnormal findings: Secondary | ICD-10-CM | POA: Diagnosis not present

## 2016-09-10 DIAGNOSIS — J45909 Unspecified asthma, uncomplicated: Secondary | ICD-10-CM | POA: Diagnosis not present

## 2016-09-10 DIAGNOSIS — M7711 Lateral epicondylitis, right elbow: Secondary | ICD-10-CM | POA: Diagnosis not present

## 2016-09-10 DIAGNOSIS — Z1212 Encounter for screening for malignant neoplasm of rectum: Secondary | ICD-10-CM | POA: Diagnosis not present

## 2016-09-10 DIAGNOSIS — L719 Rosacea, unspecified: Secondary | ICD-10-CM | POA: Diagnosis not present

## 2016-11-11 DIAGNOSIS — E7220 Disorder of urea cycle metabolism, unspecified: Secondary | ICD-10-CM | POA: Diagnosis not present

## 2016-11-11 DIAGNOSIS — L719 Rosacea, unspecified: Secondary | ICD-10-CM | POA: Diagnosis not present

## 2016-11-11 DIAGNOSIS — G4700 Insomnia, unspecified: Secondary | ICD-10-CM | POA: Diagnosis not present

## 2017-01-08 DIAGNOSIS — E7220 Disorder of urea cycle metabolism, unspecified: Secondary | ICD-10-CM | POA: Diagnosis not present

## 2017-01-12 DIAGNOSIS — E7220 Disorder of urea cycle metabolism, unspecified: Secondary | ICD-10-CM | POA: Diagnosis not present

## 2017-01-12 DIAGNOSIS — F429 Obsessive-compulsive disorder, unspecified: Secondary | ICD-10-CM | POA: Diagnosis not present

## 2017-01-12 DIAGNOSIS — L719 Rosacea, unspecified: Secondary | ICD-10-CM | POA: Diagnosis not present

## 2017-02-10 ENCOUNTER — Emergency Department (HOSPITAL_COMMUNITY)
Admission: EM | Admit: 2017-02-10 | Discharge: 2017-02-10 | Disposition: A | Discharge status: Home / Self Care | Payer: BLUE CROSS/BLUE SHIELD | Attending: Emergency Medicine | Admitting: Emergency Medicine

## 2017-02-10 ENCOUNTER — Encounter (HOSPITAL_COMMUNITY): Payer: Self-pay | Admitting: Emergency Medicine

## 2017-02-10 DIAGNOSIS — Z79899 Other long term (current) drug therapy: Secondary | ICD-10-CM | POA: Diagnosis not present

## 2017-02-10 DIAGNOSIS — Y990 Civilian activity done for income or pay: Secondary | ICD-10-CM | POA: Insufficient documentation

## 2017-02-10 DIAGNOSIS — F17210 Nicotine dependence, cigarettes, uncomplicated: Secondary | ICD-10-CM | POA: Diagnosis not present

## 2017-02-10 DIAGNOSIS — Y9389 Activity, other specified: Secondary | ICD-10-CM | POA: Insufficient documentation

## 2017-02-10 DIAGNOSIS — S81011A Laceration without foreign body, right knee, initial encounter: Secondary | ICD-10-CM | POA: Diagnosis not present

## 2017-02-10 DIAGNOSIS — Y9289 Other specified places as the place of occurrence of the external cause: Secondary | ICD-10-CM | POA: Diagnosis not present

## 2017-02-10 DIAGNOSIS — S81811A Laceration without foreign body, right lower leg, initial encounter: Secondary | ICD-10-CM

## 2017-02-10 DIAGNOSIS — W208XXA Other cause of strike by thrown, projected or falling object, initial encounter: Secondary | ICD-10-CM | POA: Insufficient documentation

## 2017-02-10 MED ORDER — LIDOCAINE-EPINEPHRINE (PF) 2 %-1:200000 IJ SOLN
INTRAMUSCULAR | Status: AC
  Filled 2017-02-10: qty 20

## 2017-02-10 MED ORDER — TETANUS-DIPHTH-ACELL PERTUSSIS 5-2.5-18.5 LF-MCG/0.5 IM SUSP
0.5000 mL | Freq: Once | INTRAMUSCULAR | Status: DC
  Filled 2017-02-10: qty 0.5

## 2017-02-10 MED ORDER — LIDOCAINE HCL (PF) 1 % IJ SOLN: 5.0000 mL | Freq: Once | INTRAMUSCULAR | Status: DC

## 2017-02-10 NOTE — ED Provider Notes (Signed)
[facility] DEPT Provider Note   CSN: [id number] Arrival date & time: 02/10/17  0904     History   Chief Complaint Chief Complaint  Patient presents with  . Laceration    HPI Austin Brown is a 46 y.o. male who presents today after having a laceration to the proximal portion of his right knee approximately 1.5 hours ago. The patient states that he was at work using a floor sander, when the [place] from the patient's hand, hitting the wall and then ricocheting and hitting the patient's knee causing a laceration. The patient has had no pain associated with this and rates his pain currently as a 0/10. He does have full range of motion of the knee and normal ambulation. He denies any numbness or tingling distally to the injury. No weakness.Tetanus up-to-date.   HPI  Past Medical History:  Diagnosis Date  . Asthma   . Hypertension     There are no active problems to display for this patient.   History reviewed. No pertinent surgical history.     Home Medications    Prior to Admission medications   Medication Sig Start Date End Date Taking? Authorizing Provider  alprazolam Duanne Moron) 2 MG tablet Take 1 mg by mouth at bedtime as needed for sleep.     [provider]  fexofenadine (ALLEGRA) 180 MG tablet Take 180 mg by mouth daily.    [provider]  fexofenadine (ALLEGRA) 180 MG tablet Take 180 mg by mouth daily as needed for allergies or rhinitis.    [provider]  oxyCODONE-acetaminophen (PERCOCET/ROXICET) 5-325 MG per tablet Take 2 tablets by mouth every 4 (four) hours as needed for severe pain. Patient not taking: Reported on 08/27/2014 01/29/14   Ripley Fraise, MD    Family History History reviewed. No pertinent family history.  Social History Social History  Substance Use Topics  . Smoking status: Current Every Day Smoker    Packs/day: 1.50    Types: Cigarettes  . Smokeless tobacco: Current User  . Alcohol use 4.8 oz/week    8  Glasses of wine per week     Allergies   Amoxicillin   Review of Systems Review of Systems  Musculoskeletal: Negative for arthralgias, joint swelling and myalgias.  Skin: Positive for wound.  Neurological: Negative for numbness.     Physical Exam Updated Vital Signs BP 124/85 (BP Location: Right Arm)   Pulse 63   Temp 98.2 F (36.8 C) (Oral)   Resp 16   SpO2 100%   Physical Exam  Constitutional: He appears well-developed and well-nourished.  HENT:  Head: Normocephalic and atraumatic.  Right Ear: External ear normal.  Left Ear: External ear normal.  Eyes: Conjunctivae are normal. Right eye exhibits no discharge. Left eye exhibits no discharge. No scleral icterus.  Cardiovascular:  Pulses:      Dorsalis pedis pulses are 2+ on the right side, and 2+ on the left side.       Posterior tibial pulses are 2+ on the right side, and 2+ on the left side.  Pulmonary/Chest: Effort normal. No respiratory distress.  Musculoskeletal:       Right knee: He exhibits laceration. He exhibits normal range of motion, no swelling, no effusion, no ecchymosis, no deformity, no erythema and no bony tenderness. No tenderness found.  Neurovascularly intact distally. Compartments soft above and below affected joint.   Neurological: He is alert. He has normal strength. No sensory deficit. Gait normal.  Skin: Skin is warm and  dry. Capillary refill takes less than 2 seconds. No erythema. No pallor.  2 cm laceration to the proximal right knee, above the patella. Does not appear to enter joint space. No tendon or muscle exposure. No surrounding erythema, discharge, swelling.  Psychiatric: He has a normal mood and affect.  Nursing note and vitals reviewed.  ED Treatments / Results  Labs (all labs ordered are listed, but only abnormal results are displayed) Labs Reviewed - No data to display  EKG  EKG Interpretation None       Radiology No results found.  Procedures .Marland KitchenLaceration  Repair Date/Time: 02/10/2017 12:25 PM Performed by: Jillyn Ledger Authorized by: Jillyn Ledger   Consent:    Consent obtained:  Verbal   Consent given by:  Patient   Risks discussed:  Infection, need for additional repair, nerve damage, poor wound healing, poor cosmetic result, pain, retained foreign body, tendon damage and vascular damage   Alternatives discussed:  No treatment Anesthesia (see MAR for exact dosages):    Anesthesia method:  Local infiltration   Local anesthetic:  Lidocaine 2% WITH epi Laceration details:    Location:  Leg   Leg location: Proximal to right kee.   Length (cm):  2   Laceration depth: superficial. Repair type:    Repair type:  Simple Pre-procedure details:    Preparation:  Patient was prepped and draped in usual sterile fashion Treatment:    Area cleansed with:  Betadine and saline   Amount of cleaning:  Standard   Irrigation solution:  Sterile water   Irrigation volume:  400   Irrigation method:  Syringe   Visualized foreign bodies/material removed: no   Skin repair:    Repair method:  Sutures   Suture size:  5-0   Suture material:  Prolene   Suture technique:  Simple interrupted   Number of sutures:  3 Approximation:    Approximation:  Close Post-procedure details:    Dressing:  Antibiotic ointment and non-adherent dressing   Patient tolerance of procedure:  Tolerated well, no immediate complications   (including critical care time)  Medications Ordered in ED Medications - No data to display   Initial Impression / Assessment and Plan / ED Course  I have reviewed the triage vital signs and the nursing notes.  Pertinent labs & imaging results that were available during my care of the patient were reviewed by me and considered in my medical decision making (see chart for details).     46-year-old male presenting today for 2 cm laceration on the proximal aspect of the right knee which onset after having a floor sander ricochet  off a wall into his knee. This appears to not extend into the joint space. Patient is with full range of motion is neurovascular intact. No evidence of infection. Pressure irrigation performed. Wound explored and base of wound visualized in a bloodless field without evidence of foreign body.  Laceration occurred < 8 hours prior to repair which was well tolerated. Tdap up to date.  Pt has no comorbidities to effect normal wound healing. Pt discharged without antibiotics.  Discussed suture home care with patient and answered questions. Pt to follow-up for wound check and suture removal in 7 days; they are to return to the ED sooner for signs of infection. Pt is hemodynamically stable with no complaints prior to dc.   Final Clinical Impressions(s) / ED Diagnoses   Final diagnoses:  Laceration of right lower extremity, initial encounter    New  Prescriptions Discharge Medication List as of 02/10/2017 12:27 PM       Jillyn Ledger, PA-C 02/10/17 2058    Lajean Saver, MD 02/11/17 [phone]

## 2017-02-10 NOTE — ED Triage Notes (Signed)
Pt c/o right knee laceration onset 45 minutes ago, bleeding controlled. Tetanus up to date.

## 2017-02-10 NOTE — Discharge Instructions (Signed)
Follow up with your doctor, an urgent care, or this Emergency Department for removal of your stitches in 7 days. Do not submerge the stitches in water for the first 24 hours. Take your pain medication as prescribed. Do not operate heavy machinery while on pain medication.    TREATMENT  Keep the wound clean and dry for the next 24 hours and leave the dressing in place. You may shower after 24 hours. Do not soak the area for long periods of times as in a bath until the sutures are removed. After 24 hours you may remove the dressing and gently clean the laceration site with antibacterial soap and warm water 2 times a day. Pat dry with clean towel. Do not scrub. Once the wound has healed, scarring can be minimized by covering the wound with sunscreen during the day for 1 full year.  SEEK MEDICAL CARE IF:  You have redness, swelling, or increasing pain in the wound.  You see a red line that goes away from the wound.  You have yellowish-white fluid (pus) coming from the wound.  You have a fever.  You notice a bad smell coming from the wound or dressing.  Your wound breaks open before or after sutures have been removed.  You notice something coming out of the wound such as wood or glass.  Your wound is on your hand or foot and you cannot move a finger or toe.  Your pain is not controlled with prescribed medicine.   If you did not receive a tetanus shot today because you thought you were up to date, but did not recall when your last one was given, make sure to check with your primary caregiver to determine if you need one.

## 2017-02-19 DIAGNOSIS — F429 Obsessive-compulsive disorder, unspecified: Secondary | ICD-10-CM | POA: Diagnosis not present

## 2017-02-19 DIAGNOSIS — S81011D Laceration without foreign body, right knee, subsequent encounter: Secondary | ICD-10-CM | POA: Diagnosis not present

## 2017-04-21 DIAGNOSIS — F429 Obsessive-compulsive disorder, unspecified: Secondary | ICD-10-CM | POA: Diagnosis not present

## 2017-05-26 DIAGNOSIS — F429 Obsessive-compulsive disorder, unspecified: Secondary | ICD-10-CM | POA: Diagnosis not present

## 2017-05-28 DIAGNOSIS — D23122 Other benign neoplasm of skin of left lower eyelid, including canthus: Secondary | ICD-10-CM | POA: Diagnosis not present

## 2017-06-29 IMAGING — US US ABDOMEN COMPLETE
1 series · 14 of 25 positions shown · non-contrast
Comparison: None.

CLINICAL DATA: Elevated liver function tests, hypertension

EXAM:
ABDOMEN ULTRASOUND COMPLETE

[Series 1: us abdomen complete · 0.21mm/px · 14 of 86 slices shown]
[im 1/86]
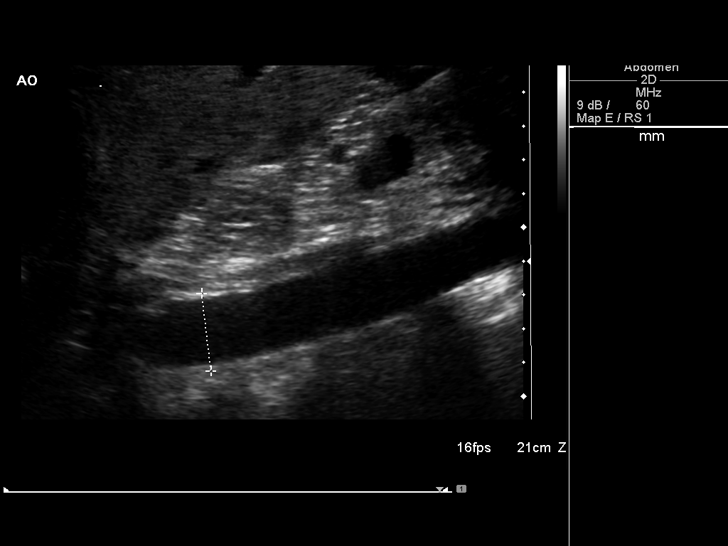
[im 8/86]
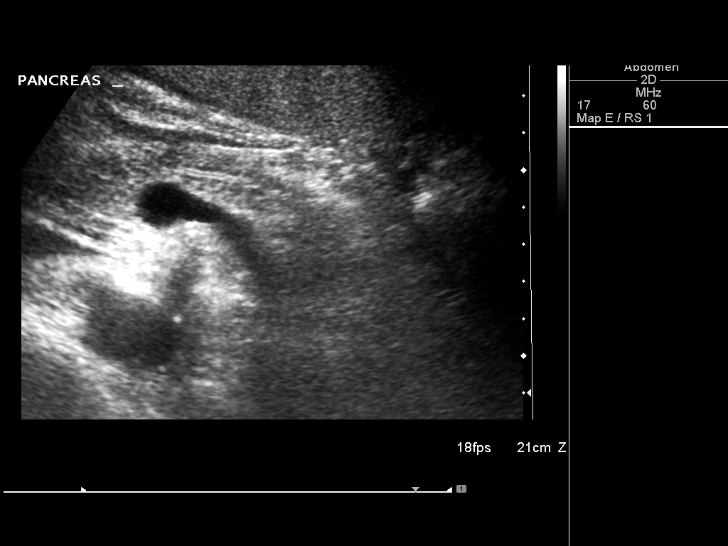
[im 15/86]
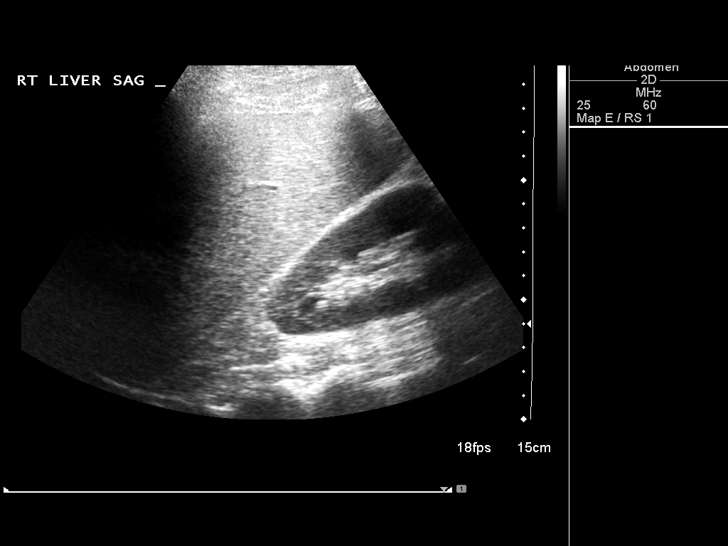
[im 22/86]
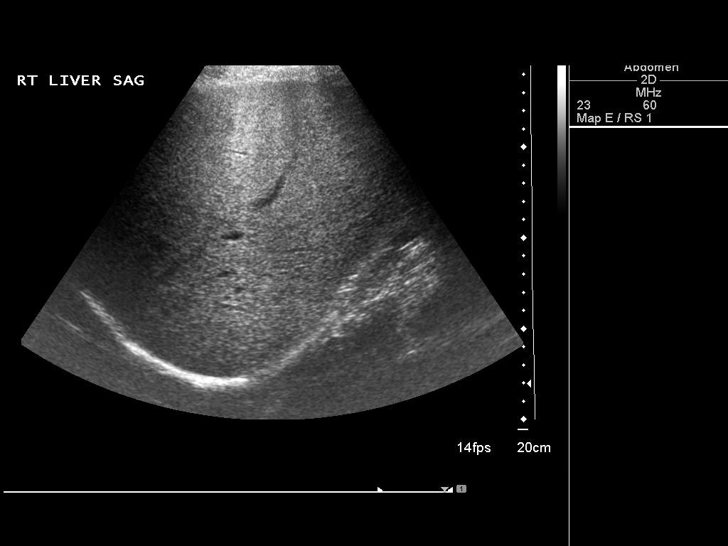
[im 29/86]
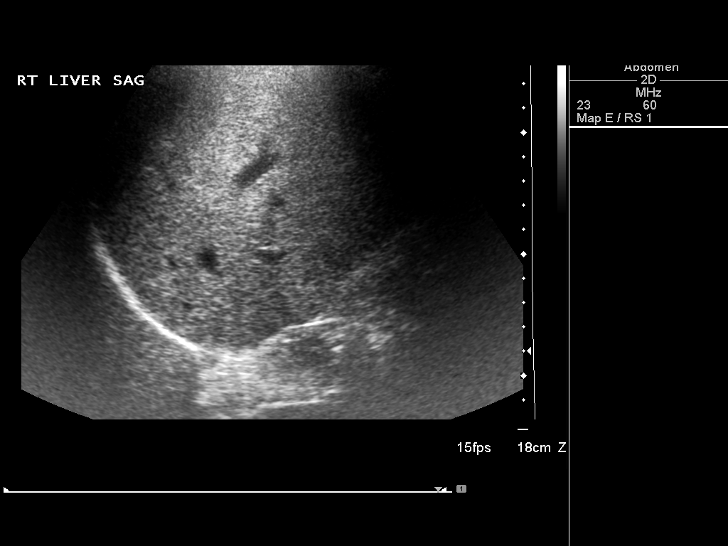
[im 32/86]
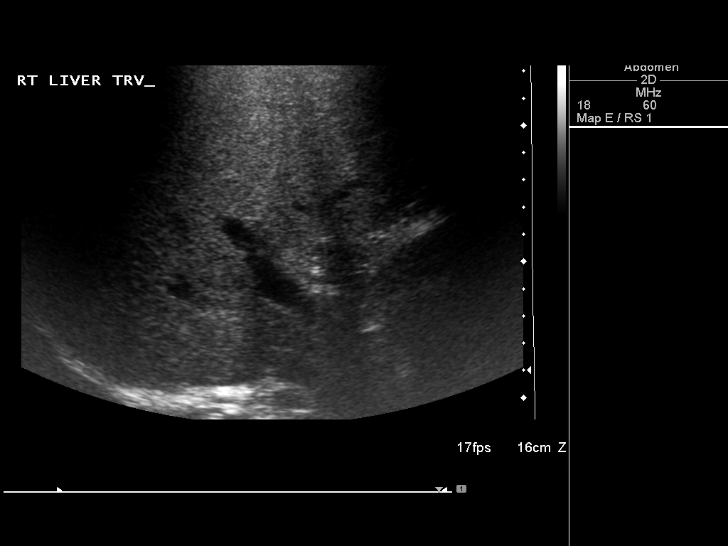
[im 39/86]
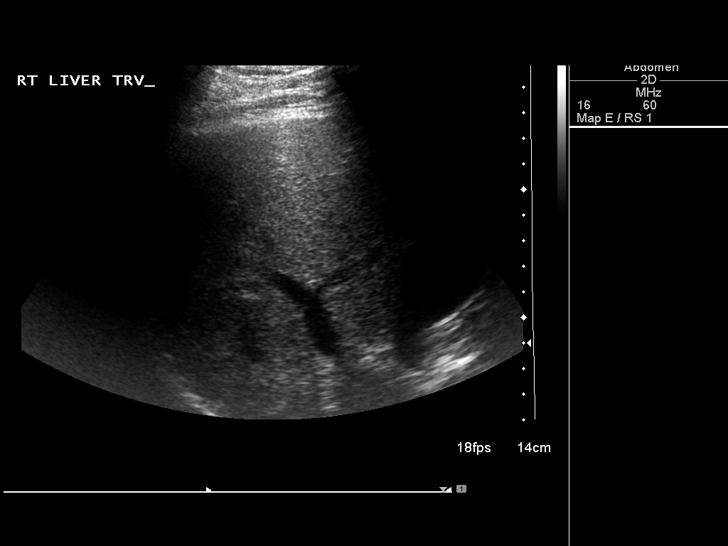
[im 47/86]
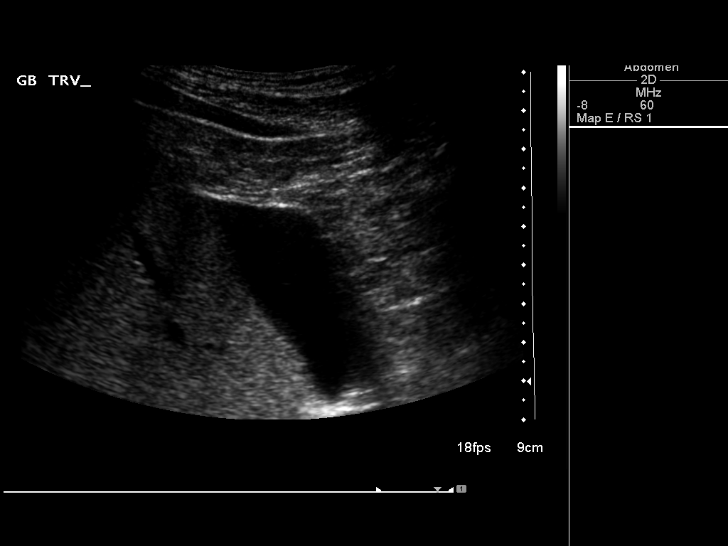
[im 54/86]
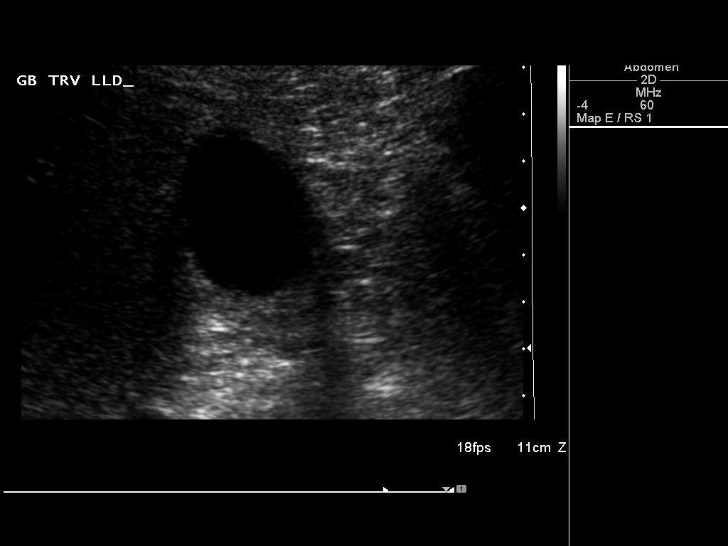
[im 57/86]
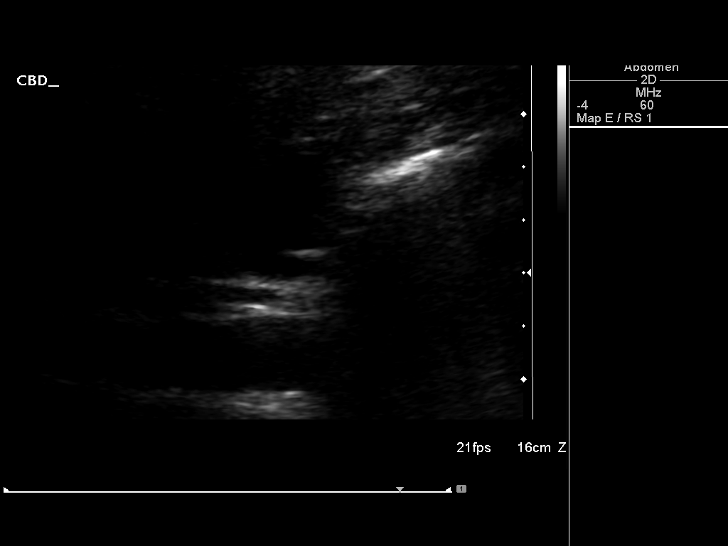
[im 64/86]
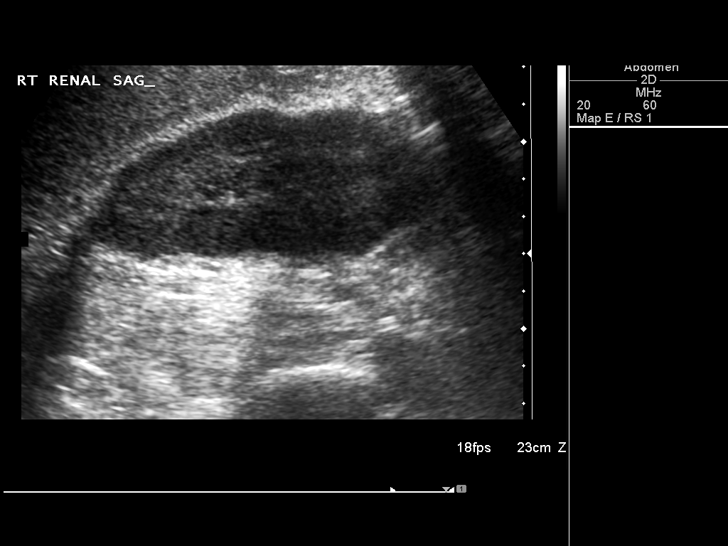
[im 71/86]
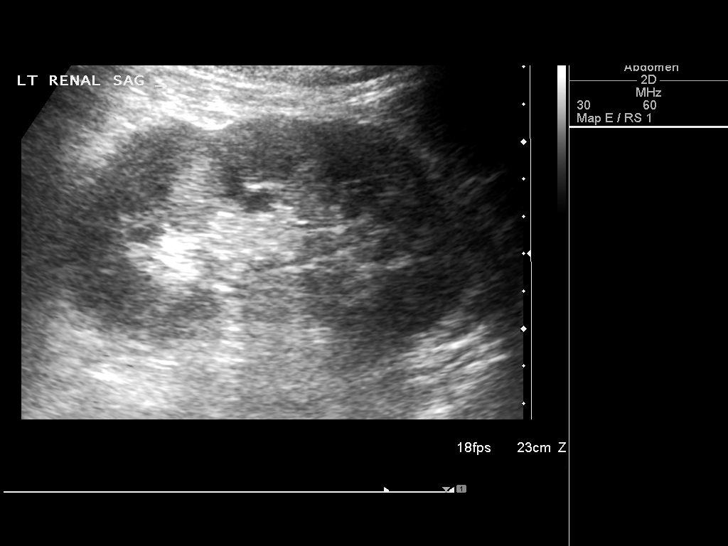
[im 78/86]
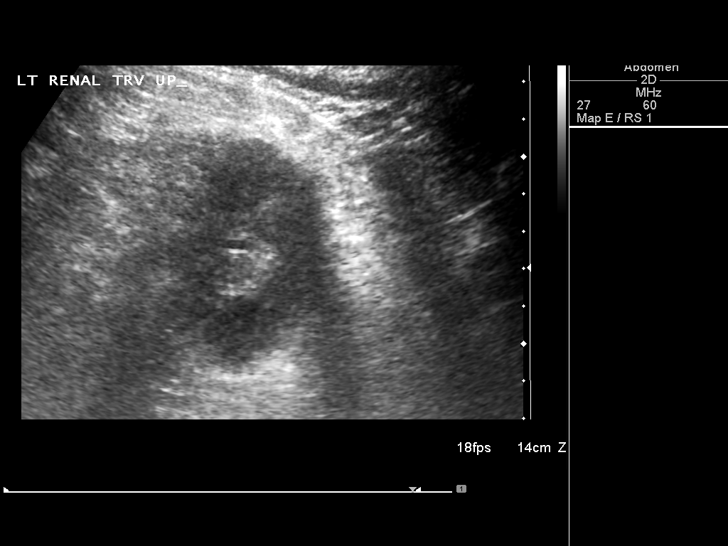
[im 86/86]
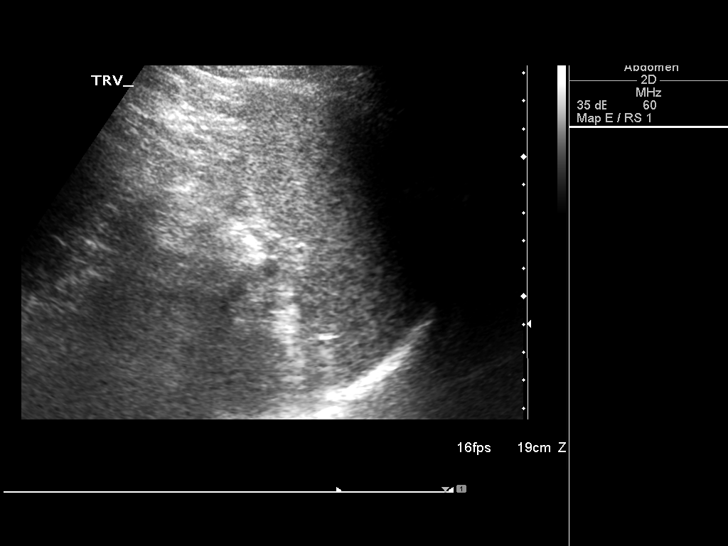

[14 of 25 positions shown; findings below may reference images not displayed]

FINDINGS: Gallbladder: The gallbladder is visualized and no gallstones are
noted. There is no pain over the gallbladder with compression.

Common bile duct: Diameter: The common bile duct is normal measuring
3.5 mm.

Liver: The liver is diffusely echogenic and inhomogeneous consistent
with fatty infiltration. No focal hepatic abnormality is seen.

IVC: No abnormality visualized.

Pancreas: The head and tail the pancreas are obscured by bowel gas.
The midbody of the pancreas is unremarkable.

Spleen: The spleen measures 7.5 cm.

Right Kidney: Length: 12.2 cm.. No hydronephrosis is seen. A small
cyst is present in the upper pole of 1.4 cm.

Left Kidney: Length: 11.7 cm..  No hydronephrosis is noted.

Abdominal aorta: The abdominal aorta is normal in caliber.

Other findings: None.
IMPRESSION: 1. Echogenic inhomogeneous liver parenchyma consistent with diffuse
fatty infiltration. No focal hepatic abnormality.
2. No gallstones.
3. Portions of the tail of the pancreas are obscured by bowel gas.

## 2017-07-07 DIAGNOSIS — F429 Obsessive-compulsive disorder, unspecified: Secondary | ICD-10-CM | POA: Diagnosis not present

## 2017-07-27 DIAGNOSIS — D23122 Other benign neoplasm of skin of left lower eyelid, including canthus: Secondary | ICD-10-CM | POA: Diagnosis not present

## 2017-08-05 DIAGNOSIS — L03114 Cellulitis of left upper limb: Secondary | ICD-10-CM | POA: Diagnosis not present

## 2017-09-03 DIAGNOSIS — Z125 Encounter for screening for malignant neoplasm of prostate: Secondary | ICD-10-CM | POA: Diagnosis not present

## 2017-09-03 DIAGNOSIS — Z0000 Encounter for general adult medical examination without abnormal findings: Secondary | ICD-10-CM | POA: Diagnosis not present

## 2017-09-08 DIAGNOSIS — E7220 Disorder of urea cycle metabolism, unspecified: Secondary | ICD-10-CM | POA: Diagnosis not present

## 2017-09-08 DIAGNOSIS — M7711 Lateral epicondylitis, right elbow: Secondary | ICD-10-CM | POA: Diagnosis not present

## 2017-09-08 DIAGNOSIS — J45909 Unspecified asthma, uncomplicated: Secondary | ICD-10-CM | POA: Diagnosis not present

## 2017-09-08 DIAGNOSIS — Z6830 Body mass index (BMI) 30.0-30.9, adult: Secondary | ICD-10-CM | POA: Diagnosis not present

## 2017-09-08 DIAGNOSIS — Z0001 Encounter for general adult medical examination with abnormal findings: Secondary | ICD-10-CM | POA: Diagnosis not present

## 2017-09-08 DIAGNOSIS — R7303 Prediabetes: Secondary | ICD-10-CM | POA: Diagnosis not present

## 2017-10-20 DIAGNOSIS — F429 Obsessive-compulsive disorder, unspecified: Secondary | ICD-10-CM | POA: Diagnosis not present

## 2017-10-20 DIAGNOSIS — Z6830 Body mass index (BMI) 30.0-30.9, adult: Secondary | ICD-10-CM | POA: Diagnosis not present

## 2017-10-20 DIAGNOSIS — R3129 Other microscopic hematuria: Secondary | ICD-10-CM | POA: Diagnosis not present

## 2017-10-20 DIAGNOSIS — R7303 Prediabetes: Secondary | ICD-10-CM | POA: Diagnosis not present

## 2018-02-23 DIAGNOSIS — F411 Generalized anxiety disorder: Secondary | ICD-10-CM | POA: Diagnosis not present

## 2018-03-08 DIAGNOSIS — R7303 Prediabetes: Secondary | ICD-10-CM | POA: Diagnosis not present

## 2018-03-29 DIAGNOSIS — M79672 Pain in left foot: Secondary | ICD-10-CM | POA: Diagnosis not present

## 2018-03-29 DIAGNOSIS — B079 Viral wart, unspecified: Secondary | ICD-10-CM | POA: Diagnosis not present

## 2018-03-29 DIAGNOSIS — M79671 Pain in right foot: Secondary | ICD-10-CM | POA: Diagnosis not present

## 2018-04-12 DIAGNOSIS — B079 Viral wart, unspecified: Secondary | ICD-10-CM | POA: Diagnosis not present

## 2018-04-12 DIAGNOSIS — M2022 Hallux rigidus, left foot: Secondary | ICD-10-CM | POA: Diagnosis not present

## 2018-04-12 DIAGNOSIS — M2021 Hallux rigidus, right foot: Secondary | ICD-10-CM | POA: Diagnosis not present

## 2018-05-04 DIAGNOSIS — B079 Viral wart, unspecified: Secondary | ICD-10-CM | POA: Diagnosis not present

## 2018-05-19 DIAGNOSIS — B079 Viral wart, unspecified: Secondary | ICD-10-CM | POA: Diagnosis not present

## 2018-05-19 DIAGNOSIS — M79674 Pain in right toe(s): Secondary | ICD-10-CM | POA: Diagnosis not present

## 2018-05-19 DIAGNOSIS — B351 Tinea unguium: Secondary | ICD-10-CM | POA: Diagnosis not present

## 2018-05-19 DIAGNOSIS — L600 Ingrowing nail: Secondary | ICD-10-CM | POA: Diagnosis not present

## 2018-05-19 DIAGNOSIS — M79675 Pain in left toe(s): Secondary | ICD-10-CM | POA: Diagnosis not present

## 2018-06-07 DIAGNOSIS — B079 Viral wart, unspecified: Secondary | ICD-10-CM | POA: Diagnosis not present

## 2018-06-21 DIAGNOSIS — B079 Viral wart, unspecified: Secondary | ICD-10-CM | POA: Diagnosis not present

## 2018-06-21 DIAGNOSIS — B351 Tinea unguium: Secondary | ICD-10-CM | POA: Diagnosis not present

## 2018-06-21 DIAGNOSIS — L600 Ingrowing nail: Secondary | ICD-10-CM | POA: Diagnosis not present

## 2018-08-02 DIAGNOSIS — F419 Anxiety disorder, unspecified: Secondary | ICD-10-CM | POA: Diagnosis not present

## 2018-08-03 DIAGNOSIS — H04123 Dry eye syndrome of bilateral lacrimal glands: Secondary | ICD-10-CM | POA: Diagnosis not present

## 2018-08-05 DIAGNOSIS — L600 Ingrowing nail: Secondary | ICD-10-CM | POA: Diagnosis not present

## 2018-08-05 DIAGNOSIS — M79674 Pain in right toe(s): Secondary | ICD-10-CM | POA: Diagnosis not present

## 2018-08-05 DIAGNOSIS — M79675 Pain in left toe(s): Secondary | ICD-10-CM | POA: Diagnosis not present

## 2018-08-05 DIAGNOSIS — B351 Tinea unguium: Secondary | ICD-10-CM | POA: Diagnosis not present

## 2018-08-24 DIAGNOSIS — F419 Anxiety disorder, unspecified: Secondary | ICD-10-CM | POA: Diagnosis not present

## 2018-09-02 DIAGNOSIS — L600 Ingrowing nail: Secondary | ICD-10-CM | POA: Diagnosis not present

## 2018-09-02 DIAGNOSIS — B351 Tinea unguium: Secondary | ICD-10-CM | POA: Diagnosis not present

## 2018-09-09 DIAGNOSIS — R7303 Prediabetes: Secondary | ICD-10-CM | POA: Diagnosis not present

## 2018-09-14 DIAGNOSIS — Z1212 Encounter for screening for malignant neoplasm of rectum: Secondary | ICD-10-CM | POA: Diagnosis not present

## 2018-09-14 DIAGNOSIS — L309 Dermatitis, unspecified: Secondary | ICD-10-CM | POA: Diagnosis not present

## 2018-09-14 DIAGNOSIS — J45909 Unspecified asthma, uncomplicated: Secondary | ICD-10-CM | POA: Diagnosis not present

## 2018-09-14 DIAGNOSIS — L719 Rosacea, unspecified: Secondary | ICD-10-CM | POA: Diagnosis not present

## 2018-09-14 DIAGNOSIS — R7303 Prediabetes: Secondary | ICD-10-CM | POA: Diagnosis not present

## 2018-09-15 DIAGNOSIS — R41840 Attention and concentration deficit: Secondary | ICD-10-CM | POA: Diagnosis not present

## 2018-09-15 DIAGNOSIS — Z79899 Other long term (current) drug therapy: Secondary | ICD-10-CM | POA: Diagnosis not present

## 2018-09-15 DIAGNOSIS — F429 Obsessive-compulsive disorder, unspecified: Secondary | ICD-10-CM | POA: Diagnosis not present

## 2018-10-26 DIAGNOSIS — F419 Anxiety disorder, unspecified: Secondary | ICD-10-CM | POA: Diagnosis not present

## 2018-12-22 DIAGNOSIS — L03111 Cellulitis of right axilla: Secondary | ICD-10-CM | POA: Diagnosis not present

## 2018-12-22 DIAGNOSIS — R05 Cough: Secondary | ICD-10-CM | POA: Diagnosis not present

## 2019-03-23 DIAGNOSIS — M7061 Trochanteric bursitis, right hip: Secondary | ICD-10-CM | POA: Diagnosis not present

## 2019-04-11 DIAGNOSIS — M6281 Muscle weakness (generalized): Secondary | ICD-10-CM | POA: Diagnosis not present

## 2019-04-11 DIAGNOSIS — M79604 Pain in right leg: Secondary | ICD-10-CM | POA: Diagnosis not present

## 2019-04-11 DIAGNOSIS — R262 Difficulty in walking, not elsewhere classified: Secondary | ICD-10-CM | POA: Diagnosis not present

## 2019-04-21 DIAGNOSIS — R262 Difficulty in walking, not elsewhere classified: Secondary | ICD-10-CM | POA: Diagnosis not present

## 2019-04-21 DIAGNOSIS — M6281 Muscle weakness (generalized): Secondary | ICD-10-CM | POA: Diagnosis not present

## 2019-04-21 DIAGNOSIS — M79604 Pain in right leg: Secondary | ICD-10-CM | POA: Diagnosis not present

## 2019-05-04 DIAGNOSIS — M79604 Pain in right leg: Secondary | ICD-10-CM | POA: Diagnosis not present

## 2019-05-04 DIAGNOSIS — M6281 Muscle weakness (generalized): Secondary | ICD-10-CM | POA: Diagnosis not present

## 2019-05-04 DIAGNOSIS — R262 Difficulty in walking, not elsewhere classified: Secondary | ICD-10-CM | POA: Diagnosis not present

## 2019-05-16 DIAGNOSIS — M6281 Muscle weakness (generalized): Secondary | ICD-10-CM | POA: Diagnosis not present

## 2019-05-16 DIAGNOSIS — M79604 Pain in right leg: Secondary | ICD-10-CM | POA: Diagnosis not present

## 2019-05-16 DIAGNOSIS — R262 Difficulty in walking, not elsewhere classified: Secondary | ICD-10-CM | POA: Diagnosis not present

## 2019-08-05 DIAGNOSIS — H5203 Hypermetropia, bilateral: Secondary | ICD-10-CM | POA: Diagnosis not present

## 2019-08-05 DIAGNOSIS — H04123 Dry eye syndrome of bilateral lacrimal glands: Secondary | ICD-10-CM | POA: Diagnosis not present

## 2019-09-13 DIAGNOSIS — Z0000 Encounter for general adult medical examination without abnormal findings: Secondary | ICD-10-CM | POA: Diagnosis not present

## 2019-09-13 DIAGNOSIS — Z125 Encounter for screening for malignant neoplasm of prostate: Secondary | ICD-10-CM | POA: Diagnosis not present

## 2019-09-20 DIAGNOSIS — Z1212 Encounter for screening for malignant neoplasm of rectum: Secondary | ICD-10-CM | POA: Diagnosis not present

## 2019-09-20 DIAGNOSIS — I10 Essential (primary) hypertension: Secondary | ICD-10-CM | POA: Diagnosis not present

## 2019-09-20 DIAGNOSIS — Z0001 Encounter for general adult medical examination with abnormal findings: Secondary | ICD-10-CM | POA: Diagnosis not present

## 2019-11-17 DIAGNOSIS — E669 Obesity, unspecified: Secondary | ICD-10-CM | POA: Diagnosis not present

## 2019-11-17 DIAGNOSIS — I10 Essential (primary) hypertension: Secondary | ICD-10-CM | POA: Diagnosis not present

## 2019-11-17 DIAGNOSIS — F17200 Nicotine dependence, unspecified, uncomplicated: Secondary | ICD-10-CM | POA: Diagnosis not present

## 2019-11-17 DIAGNOSIS — E785 Hyperlipidemia, unspecified: Secondary | ICD-10-CM | POA: Diagnosis not present

## 2020-01-17 DIAGNOSIS — F17200 Nicotine dependence, unspecified, uncomplicated: Secondary | ICD-10-CM | POA: Diagnosis not present

## 2020-01-17 DIAGNOSIS — I10 Essential (primary) hypertension: Secondary | ICD-10-CM | POA: Diagnosis not present

## 2020-01-17 DIAGNOSIS — E785 Hyperlipidemia, unspecified: Secondary | ICD-10-CM | POA: Diagnosis not present

## 2020-01-17 DIAGNOSIS — E669 Obesity, unspecified: Secondary | ICD-10-CM | POA: Diagnosis not present

## 2020-04-23 DIAGNOSIS — I10 Essential (primary) hypertension: Secondary | ICD-10-CM | POA: Diagnosis not present

## 2020-04-23 DIAGNOSIS — E785 Hyperlipidemia, unspecified: Secondary | ICD-10-CM | POA: Diagnosis not present

## 2020-04-23 DIAGNOSIS — R7303 Prediabetes: Secondary | ICD-10-CM | POA: Diagnosis not present

## 2020-06-01 DIAGNOSIS — E876 Hypokalemia: Secondary | ICD-10-CM | POA: Diagnosis not present

## 2020-09-20 DIAGNOSIS — E785 Hyperlipidemia, unspecified: Secondary | ICD-10-CM | POA: Diagnosis not present

## 2020-09-20 DIAGNOSIS — Z0000 Encounter for general adult medical examination without abnormal findings: Secondary | ICD-10-CM | POA: Diagnosis not present

## 2020-09-20 DIAGNOSIS — Z125 Encounter for screening for malignant neoplasm of prostate: Secondary | ICD-10-CM | POA: Diagnosis not present

## 2020-09-25 DIAGNOSIS — R7303 Prediabetes: Secondary | ICD-10-CM | POA: Diagnosis not present

## 2020-09-25 DIAGNOSIS — Z87898 Personal history of other specified conditions: Secondary | ICD-10-CM | POA: Diagnosis not present

## 2020-09-25 DIAGNOSIS — F17200 Nicotine dependence, unspecified, uncomplicated: Secondary | ICD-10-CM | POA: Diagnosis not present

## 2020-09-25 DIAGNOSIS — Z0001 Encounter for general adult medical examination with abnormal findings: Secondary | ICD-10-CM | POA: Diagnosis not present

## 2020-09-25 DIAGNOSIS — R030 Elevated blood-pressure reading, without diagnosis of hypertension: Secondary | ICD-10-CM | POA: Diagnosis not present

## 2020-10-22 DIAGNOSIS — I10 Essential (primary) hypertension: Secondary | ICD-10-CM | POA: Diagnosis not present

## 2020-11-28 DIAGNOSIS — M47816 Spondylosis without myelopathy or radiculopathy, lumbar region: Secondary | ICD-10-CM | POA: Diagnosis not present

## 2020-11-28 DIAGNOSIS — M25551 Pain in right hip: Secondary | ICD-10-CM | POA: Diagnosis not present

## 2020-12-05 DIAGNOSIS — M25551 Pain in right hip: Secondary | ICD-10-CM | POA: Diagnosis not present

## 2020-12-05 DIAGNOSIS — M1611 Unilateral primary osteoarthritis, right hip: Secondary | ICD-10-CM | POA: Diagnosis not present

## 2021-01-01 DIAGNOSIS — M1611 Unilateral primary osteoarthritis, right hip: Secondary | ICD-10-CM | POA: Diagnosis not present

## 2021-01-01 DIAGNOSIS — M25551 Pain in right hip: Secondary | ICD-10-CM | POA: Diagnosis not present

## 2021-02-14 DIAGNOSIS — M1611 Unilateral primary osteoarthritis, right hip: Secondary | ICD-10-CM | POA: Diagnosis not present

## 2021-02-14 DIAGNOSIS — M545 Low back pain, unspecified: Secondary | ICD-10-CM | POA: Diagnosis not present

## 2021-02-21 DIAGNOSIS — M1611 Unilateral primary osteoarthritis, right hip: Secondary | ICD-10-CM | POA: Diagnosis not present

## 2021-02-27 DIAGNOSIS — Z1212 Encounter for screening for malignant neoplasm of rectum: Secondary | ICD-10-CM | POA: Diagnosis not present

## 2021-02-27 DIAGNOSIS — Z1211 Encounter for screening for malignant neoplasm of colon: Secondary | ICD-10-CM | POA: Diagnosis not present

## 2021-03-06 ENCOUNTER — Ambulatory Visit (INDEPENDENT_AMBULATORY_CARE_PROVIDER_SITE_OTHER): Payer: BC Managed Care – PPO | Admitting: Otolaryngology

## 2021-03-06 ENCOUNTER — Other Ambulatory Visit: Payer: Self-pay

## 2021-03-06 DIAGNOSIS — J310 Chronic rhinitis: Secondary | ICD-10-CM | POA: Diagnosis not present

## 2021-03-06 MED ORDER — FLUTICASONE PROPIONATE 50 MCG/ACT NA SUSP: 2.0000 | Freq: Every day | NASAL | 6 refills | Status: DC

## 2021-03-06 NOTE — Progress Notes (Addendum)
HPI: Austin Brown is a 50 y.o. male who presents is referred by his PCP for evaluation of questionable obstructive sleep apnea.  He was doing worse several weeks ago where he was having difficulty sleeping well and waking up in the middle of the night short of breath.  He has been doing better the past few weeks since using his asthma inhaler regularly as well as Flonase. Of note he has gained about 35 to 40 pounds over the past 2 years.  He is working on losing weight. He smokes over a pack a day.  Past Medical History:  Diagnosis Date   Asthma    Hypertension    No past surgical history on file. Social History   Socioeconomic History   Marital status: Single    Spouse name: Not on file   Number of children: Not on file   Years of education: Not on file   Highest education level: Not on file  Occupational History   Not on file  Tobacco Use   Smoking status: Every Day    Packs/day: 1.50    Types: Cigarettes   Smokeless tobacco: Current  Substance and Sexual Activity   Alcohol use: Yes    Alcohol/week: 8.0 standard drinks    Types: 8 Glasses of wine per week   Drug use: No   Sexual activity: Not on file  Other Topics Concern   Not on file  Social History Narrative   Not on file   Social Determinants of Health   Financial Resource Strain: Not on file  Food Insecurity: Not on file  Transportation Needs: Not on file  Physical Activity: Not on file  Stress: Not on file  Social Connections: Not on file   No family history on file. Allergies  Allergen Reactions   Amoxicillin Other (See Comments)    unknown   Prior to Admission medications   Medication Sig Start Date End Date Taking? Authorizing Provider  alprazolam Duanne Moron) 2 MG tablet Take 1 mg by mouth at bedtime as needed for sleep.     [provider]  fexofenadine (ALLEGRA) 180 MG tablet Take 180 mg by mouth daily.    [provider]  fexofenadine (ALLEGRA) 180 MG tablet Take 180 mg by mouth  daily as needed for allergies or rhinitis.    [provider]  oxyCODONE-acetaminophen (PERCOCET/ROXICET) 5-325 MG per tablet Take 2 tablets by mouth every 4 (four) hours as needed for severe pain. Patient not taking: Reported on 08/27/2014 01/29/14   Ripley Fraise, MD     Positive ROS: Otherwise negative  All other systems have been reviewed and were otherwise negative with the exception of those mentioned in the HPI and as above.  Physical Exam: Constitutional: Alert, well-appearing, no acute distress Ears: External ears without lesions or tenderness. Ear canals are clear bilaterally with intact, clear TMs.  Nasal: External nose without lesions. Septum with mild deformity and moderate rhinitis.  He has some scabbing crusting in the right nostril.  No nasal polyps noted.  Both middle meatus regions were clear with no signs of infection. Oral: Lips and gums without lesions. Tongue and palate mucosa without lesions. Posterior oropharynx clear.  Small symmetric average sized tonsils bilaterally.  Indirect laryngoscopy revealed a clear base of tongue vallecula and epiglottis. Neck: No palpable adenopathy or masses.  Moderate obesity of the neck. Respiratory: Breathing comfortably  Skin: No facial/neck lesions or rash noted.  Procedures  Assessment: Questionable obstructive sleep apnea Moderate rhinitis  Plan:  Recommended regular use of the Flonase 2 sprays each nostril at night as well as asthma medication.  As this will help maximize his airway. We will go ahead and schedule a sleep test to evaluate him for obstructive sleep apnea. Also sent a prescription for Flonase to his pharmacy.  Also reviewed with him concerning use of saline rinses for the scabbing and crusting in the nose. He will contact us following his sleep test.   Radene Journey, MD   CC:

## 2021-03-13 DIAGNOSIS — G4730 Sleep apnea, unspecified: Secondary | ICD-10-CM | POA: Diagnosis not present

## 2021-03-19 DIAGNOSIS — R748 Abnormal levels of other serum enzymes: Secondary | ICD-10-CM | POA: Diagnosis not present

## 2021-03-19 DIAGNOSIS — K219 Gastro-esophageal reflux disease without esophagitis: Secondary | ICD-10-CM | POA: Diagnosis not present

## 2021-03-19 DIAGNOSIS — R195 Other fecal abnormalities: Secondary | ICD-10-CM | POA: Diagnosis not present

## 2021-03-19 DIAGNOSIS — Z1211 Encounter for screening for malignant neoplasm of colon: Secondary | ICD-10-CM | POA: Diagnosis not present

## 2021-03-22 DIAGNOSIS — M1611 Unilateral primary osteoarthritis, right hip: Secondary | ICD-10-CM | POA: Diagnosis not present

## 2021-03-28 DIAGNOSIS — M25551 Pain in right hip: Secondary | ICD-10-CM | POA: Diagnosis not present

## 2021-03-29 DIAGNOSIS — Z01818 Encounter for other preprocedural examination: Secondary | ICD-10-CM | POA: Diagnosis not present

## 2021-04-01 DIAGNOSIS — D125 Benign neoplasm of sigmoid colon: Secondary | ICD-10-CM | POA: Diagnosis not present

## 2021-04-01 DIAGNOSIS — K621 Rectal polyp: Secondary | ICD-10-CM | POA: Diagnosis not present

## 2021-04-01 DIAGNOSIS — K635 Polyp of colon: Secondary | ICD-10-CM | POA: Diagnosis not present

## 2021-04-01 DIAGNOSIS — R195 Other fecal abnormalities: Secondary | ICD-10-CM | POA: Diagnosis not present

## 2021-04-01 DIAGNOSIS — Z1211 Encounter for screening for malignant neoplasm of colon: Secondary | ICD-10-CM | POA: Diagnosis not present

## 2021-04-01 DIAGNOSIS — D124 Benign neoplasm of descending colon: Secondary | ICD-10-CM | POA: Diagnosis not present

## 2021-04-01 DIAGNOSIS — D128 Benign neoplasm of rectum: Secondary | ICD-10-CM | POA: Diagnosis not present

## 2021-04-03 DIAGNOSIS — R899 Unspecified abnormal finding in specimens from other organs, systems and tissues: Secondary | ICD-10-CM | POA: Diagnosis not present

## 2021-04-03 DIAGNOSIS — Z01818 Encounter for other preprocedural examination: Secondary | ICD-10-CM | POA: Diagnosis not present

## 2021-04-08 DIAGNOSIS — R7303 Prediabetes: Secondary | ICD-10-CM | POA: Diagnosis not present

## 2021-04-09 ENCOUNTER — Telehealth (INDEPENDENT_AMBULATORY_CARE_PROVIDER_SITE_OTHER): Payer: Self-pay | Admitting: Otolaryngology

## 2021-04-09 NOTE — Telephone Encounter (Signed)
Called patient concerning results of his snap test.  This demonstrated RDI of 61 with lowest O2 sat duration down to 54% and the time spent below oxygen saturation of 88% was 80%. I have recommended use of CPAP machine and will call to arrange this for him.

## 2021-04-11 DIAGNOSIS — E876 Hypokalemia: Secondary | ICD-10-CM | POA: Diagnosis not present

## 2021-04-11 DIAGNOSIS — F419 Anxiety disorder, unspecified: Secondary | ICD-10-CM | POA: Diagnosis not present

## 2021-04-22 DIAGNOSIS — Z01812 Encounter for preprocedural laboratory examination: Secondary | ICD-10-CM | POA: Diagnosis not present

## 2021-04-26 DIAGNOSIS — E876 Hypokalemia: Secondary | ICD-10-CM | POA: Diagnosis not present

## 2021-05-07 DIAGNOSIS — G4733 Obstructive sleep apnea (adult) (pediatric): Secondary | ICD-10-CM | POA: Diagnosis not present

## 2021-05-16 DIAGNOSIS — M1611 Unilateral primary osteoarthritis, right hip: Secondary | ICD-10-CM | POA: Diagnosis not present

## 2021-06-04 DIAGNOSIS — R7303 Prediabetes: Secondary | ICD-10-CM | POA: Diagnosis not present

## 2021-06-04 DIAGNOSIS — R899 Unspecified abnormal finding in specimens from other organs, systems and tissues: Secondary | ICD-10-CM | POA: Diagnosis not present

## 2021-06-07 DIAGNOSIS — G4733 Obstructive sleep apnea (adult) (pediatric): Secondary | ICD-10-CM | POA: Diagnosis not present

## 2021-06-10 DIAGNOSIS — Z01818 Encounter for other preprocedural examination: Secondary | ICD-10-CM | POA: Diagnosis not present

## 2021-06-12 DIAGNOSIS — M1611 Unilateral primary osteoarthritis, right hip: Secondary | ICD-10-CM | POA: Diagnosis not present

## 2021-06-21 DIAGNOSIS — M1611 Unilateral primary osteoarthritis, right hip: Secondary | ICD-10-CM | POA: Diagnosis not present

## 2021-07-07 DIAGNOSIS — G4733 Obstructive sleep apnea (adult) (pediatric): Secondary | ICD-10-CM | POA: Diagnosis not present

## 2021-08-07 DIAGNOSIS — G4733 Obstructive sleep apnea (adult) (pediatric): Secondary | ICD-10-CM | POA: Diagnosis not present

## 2021-09-07 DIAGNOSIS — G4733 Obstructive sleep apnea (adult) (pediatric): Secondary | ICD-10-CM | POA: Diagnosis not present

## 2021-10-05 DIAGNOSIS — G4733 Obstructive sleep apnea (adult) (pediatric): Secondary | ICD-10-CM | POA: Diagnosis not present

## 2021-11-05 DIAGNOSIS — G4733 Obstructive sleep apnea (adult) (pediatric): Secondary | ICD-10-CM | POA: Diagnosis not present

## 2021-11-06 ENCOUNTER — Other Ambulatory Visit: Payer: Self-pay

## 2021-11-06 ENCOUNTER — Encounter (HOSPITAL_BASED_OUTPATIENT_CLINIC_OR_DEPARTMENT_OTHER): Payer: Self-pay | Admitting: Emergency Medicine

## 2021-11-06 ENCOUNTER — Inpatient Hospital Stay (HOSPITAL_BASED_OUTPATIENT_CLINIC_OR_DEPARTMENT_OTHER)
Admission: EM | Admit: 2021-11-06 | Discharge: 2021-11-09 | DRG: 641 | Disposition: A | Discharge status: Home / Self Care | Payer: BC Managed Care – PPO | Attending: Internal Medicine | Admitting: Internal Medicine

## 2021-11-06 DIAGNOSIS — Z79899 Other long term (current) drug therapy: Secondary | ICD-10-CM

## 2021-11-06 DIAGNOSIS — E860 Dehydration: Secondary | ICD-10-CM | POA: Diagnosis present

## 2021-11-06 DIAGNOSIS — F32A Depression, unspecified: Secondary | ICD-10-CM | POA: Diagnosis not present

## 2021-11-06 DIAGNOSIS — F419 Anxiety disorder, unspecified: Secondary | ICD-10-CM | POA: Diagnosis not present

## 2021-11-06 DIAGNOSIS — I4581 Long QT syndrome: Secondary | ICD-10-CM | POA: Diagnosis not present

## 2021-11-06 DIAGNOSIS — Z6835 Body mass index (BMI) 35.0-35.9, adult: Secondary | ICD-10-CM

## 2021-11-06 DIAGNOSIS — F17210 Nicotine dependence, cigarettes, uncomplicated: Secondary | ICD-10-CM | POA: Diagnosis not present

## 2021-11-06 DIAGNOSIS — J4520 Mild intermittent asthma, uncomplicated: Secondary | ICD-10-CM | POA: Diagnosis not present

## 2021-11-06 DIAGNOSIS — E878 Other disorders of electrolyte and fluid balance, not elsewhere classified: Secondary | ICD-10-CM | POA: Diagnosis not present

## 2021-11-06 DIAGNOSIS — F10239 Alcohol dependence with withdrawal, unspecified: Secondary | ICD-10-CM | POA: Diagnosis present

## 2021-11-06 DIAGNOSIS — Z125 Encounter for screening for malignant neoplasm of prostate: Secondary | ICD-10-CM | POA: Diagnosis not present

## 2021-11-06 DIAGNOSIS — E871 Hypo-osmolality and hyponatremia: Secondary | ICD-10-CM | POA: Diagnosis not present

## 2021-11-06 DIAGNOSIS — I10 Essential (primary) hypertension: Secondary | ICD-10-CM | POA: Diagnosis not present

## 2021-11-06 DIAGNOSIS — R7303 Prediabetes: Secondary | ICD-10-CM | POA: Diagnosis not present

## 2021-11-06 DIAGNOSIS — R5383 Other fatigue: Secondary | ICD-10-CM

## 2021-11-06 DIAGNOSIS — R630 Anorexia: Secondary | ICD-10-CM | POA: Diagnosis present

## 2021-11-06 DIAGNOSIS — Z0000 Encounter for general adult medical examination without abnormal findings: Secondary | ICD-10-CM | POA: Diagnosis not present

## 2021-11-06 DIAGNOSIS — Z880 Allergy status to penicillin: Secondary | ICD-10-CM

## 2021-11-06 DIAGNOSIS — F17200 Nicotine dependence, unspecified, uncomplicated: Secondary | ICD-10-CM | POA: Diagnosis present

## 2021-11-06 DIAGNOSIS — R9431 Abnormal electrocardiogram [ECG] [EKG]: Secondary | ICD-10-CM | POA: Diagnosis present

## 2021-11-06 DIAGNOSIS — E876 Hypokalemia: Secondary | ICD-10-CM | POA: Diagnosis not present

## 2021-11-06 DIAGNOSIS — R000 Tachycardia, unspecified: Secondary | ICD-10-CM | POA: Diagnosis not present

## 2021-11-06 DIAGNOSIS — F1010 Alcohol abuse, uncomplicated: Secondary | ICD-10-CM | POA: Diagnosis not present

## 2021-11-06 LAB — CBC WITH DIFFERENTIAL/PLATELET
Abs Immature Granulocytes: 0.07 10*3/uL (ref 0.00–0.07)
Basophils Absolute: 0 10*3/uL (ref 0.0–0.1)
Basophils Relative: 0 %
Eosinophils Absolute: 0.1 10*3/uL (ref 0.0–0.5)
Eosinophils Relative: 1 %
HCT: 29 % — ABNORMAL LOW (ref 39.0–52.0)
Hemoglobin: 10.6 g/dL — ABNORMAL LOW (ref 13.0–17.0)
Immature Granulocytes: 1 %
Lymphocytes Relative: 9 %
Lymphs Abs: 0.7 10*3/uL (ref 0.7–4.0)
MCH: 35.2 pg — ABNORMAL HIGH (ref 26.0–34.0)
MCHC: 36.6 g/dL — ABNORMAL HIGH (ref 30.0–36.0)
MCV: 96.3 fL (ref 80.0–100.0)
Monocytes Absolute: 0.9 10*3/uL (ref 0.1–1.0)
Monocytes Relative: 11 %
Neutro Abs: 6.3 10*3/uL (ref 1.7–7.7)
Neutrophils Relative %: 78 %
Platelets: 203 10*3/uL (ref 150–400)
RBC: 3.01 MIL/uL — ABNORMAL LOW (ref 4.22–5.81)
RDW: 14.6 % (ref 11.5–15.5)
WBC: 8.1 10*3/uL (ref 4.0–10.5)
nRBC: 0 % (ref 0.0–0.2)

## 2021-11-06 LAB — BASIC METABOLIC PANEL
Anion gap: 10 (ref 5–15)
BUN: <5 mg/dL — ABNORMAL LOW (ref 6–20)
CO2: 42 mmol/L — ABNORMAL HIGH (ref 22–32)
Calcium: 9.7 mg/dL (ref 8.9–10.3)
Chloride: 68 mmol/L — ABNORMAL LOW (ref 98–111)
Creatinine, Ser: 1.07 mg/dL (ref 0.61–1.24)
GFR, Estimated: >60 mL/min (ref >60)
Glucose, Bld: 109 mg/dL — ABNORMAL HIGH (ref 70–99)
Potassium: 3 mmol/L — ABNORMAL LOW (ref 3.5–5.1)
Sodium: 120 mmol/L — ABNORMAL LOW (ref 135–145)

## 2021-11-06 LAB — URINALYSIS, ROUTINE W REFLEX MICROSCOPIC
Bilirubin Urine: NEGATIVE
Glucose, UA: NEGATIVE mg/dL
Hgb urine dipstick: NEGATIVE
Ketones, ur: NEGATIVE mg/dL
Leukocytes,Ua: NEGATIVE
Nitrite: NEGATIVE
Protein, ur: NEGATIVE mg/dL
Specific Gravity, Urine: 1.008 (ref 1.005–1.030)
pH: 7.5 (ref 5.0–8.0)

## 2021-11-06 LAB — HEPATIC FUNCTION PANEL
ALT: 13 U/L (ref 0–44)
AST: 57 U/L — ABNORMAL HIGH (ref 15–41)
Albumin: 3.5 g/dL (ref 3.5–5.0)
Alkaline Phosphatase: 146 U/L — ABNORMAL HIGH (ref 38–126)
Bilirubin, Direct: 0.1 mg/dL (ref 0.0–0.2)
Indirect Bilirubin: 0.5 mg/dL (ref 0.3–0.9)
Total Bilirubin: 0.6 mg/dL (ref 0.3–1.2)
Total Protein: 6.9 g/dL (ref 6.5–8.1)

## 2021-11-06 LAB — RAPID URINE DRUG SCREEN, HOSP PERFORMED
Amphetamines: NOT DETECTED
Barbiturates: NOT DETECTED
Benzodiazepines: NOT DETECTED
Cocaine: NOT DETECTED
Opiates: NOT DETECTED
Tetrahydrocannabinol: NOT DETECTED

## 2021-11-06 LAB — ETHANOL: Alcohol, Ethyl (B): 12 mg/dL — ABNORMAL HIGH (ref <10)

## 2021-11-06 LAB — MAGNESIUM: Magnesium: 1.6 mg/dL — ABNORMAL LOW (ref 1.7–2.4)

## 2021-11-06 LAB — SODIUM, URINE, RANDOM: Sodium, Ur: 33 mmol/L

## 2021-11-06 MED ORDER — MAGNESIUM SULFATE IN D5W 1-5 GM/100ML-% IV SOLN
1.0000 g | Freq: Once | INTRAVENOUS | Status: AC
  Administered 2021-11-06: 1 g via INTRAVENOUS
  Filled 2021-11-06: qty 100

## 2021-11-06 MED ORDER — LORAZEPAM 2 MG/ML IJ SOLN: 0.0000 mg | Freq: Two times a day (BID) | INTRAMUSCULAR | Status: DC

## 2021-11-06 MED ORDER — LORAZEPAM 1 MG PO TABS
0.0000 mg | ORAL_TABLET | Freq: Four times a day (QID) | ORAL | Status: AC
  Administered 2021-11-06 – 2021-11-07 (×5): 1 mg via ORAL
  Administered 2021-11-08: 2 mg via ORAL
  Filled 2021-11-06 (×5): qty 1
  Filled 2021-11-06: qty 2
  Filled 2021-11-06: qty 1
  Filled 2021-11-06: qty 2

## 2021-11-06 MED ORDER — THIAMINE HCL 100 MG/ML IJ SOLN
100.0000 mg | Freq: Every day | INTRAMUSCULAR | Status: DC
  Administered 2021-11-06 – 2021-11-07 (×2): 100 mg via INTRAVENOUS
  Filled 2021-11-06 (×2): qty 2

## 2021-11-06 MED ORDER — FOLIC ACID 1 MG PO TABS
1.0000 mg | ORAL_TABLET | Freq: Every day | ORAL | Status: DC
  Administered 2021-11-06 – 2021-11-09 (×4): 1 mg via ORAL
  Filled 2021-11-06 (×4): qty 1

## 2021-11-06 MED ORDER — POTASSIUM CHLORIDE CRYS ER 20 MEQ PO TBCR
40.0000 meq | EXTENDED_RELEASE_TABLET | Freq: Once | ORAL | Status: AC
  Administered 2021-11-06: 40 meq via ORAL
  Filled 2021-11-06: qty 2

## 2021-11-06 MED ORDER — CHLORDIAZEPOXIDE HCL 25 MG PO CAPS
50.0000 mg | ORAL_CAPSULE | Freq: Once | ORAL | Status: AC
  Administered 2021-11-06: 50 mg via ORAL
  Filled 2021-11-06: qty 2

## 2021-11-06 MED ORDER — LORAZEPAM 2 MG/ML IJ SOLN
0.0000 mg | Freq: Four times a day (QID) | INTRAMUSCULAR | Status: AC
  Administered 2021-11-06: 2 mg via INTRAVENOUS
  Filled 2021-11-06: qty 1

## 2021-11-06 MED ORDER — LORAZEPAM 1 MG PO TABS
0.0000 mg | ORAL_TABLET | Freq: Two times a day (BID) | ORAL | Status: DC
  Administered 2021-11-09: 2 mg via ORAL
  Filled 2021-11-06: qty 2

## 2021-11-06 MED ORDER — LACTATED RINGERS IV BOLUS
1000.0000 mL | Freq: Once | INTRAVENOUS | Status: AC
  Administered 2021-11-06: 1000 mL via INTRAVENOUS

## 2021-11-06 NOTE — ED Provider Notes (Signed)
?[facility] DEPT ?Provider Note ? ? ?CSN: [id number] ?Arrival date & time: 11/06/21  1502 ? ?  ? ?History ? ?Chief Complaint  ?Patient presents with  ? Abnormal Lab  ? ? ?Austin Brown is a 51 y.o. male. ? ? ?Abnormal Lab ?Patient is a 51 year old male with a past medical history significant for anxiety, depression, allergies, alcoholism ? ?Seems that for the past few years patient has been drinking 6 "club tail"  malt beverages per day.  His last alcohol intake was 8 PM yesterday evening.  He describes these as large 10% alcohol malt beverages. ? ?It seems that he has been more fatigued over the past week or so however he is in the emergency room today because he was referred here by his primary care provider who saw him earlier today for a regular checkup and was found to have abnormal electrolytes. ? ?Patient denies any pain but states he feels overall fatigued.  Seems that he is also experiencing some depression.  He states he is motivated to stop drinking alcohol.  He denies any SI, HI, AVH ? ?He states that he eats 1 or 2 meals a day.  Seems to be not eating very regularly or very healthy.  Eats fast food occasionally. ?  ? ?Home Medications ?Prior to Admission medications   ?Medication Sig Start Date End Date Taking? Authorizing Provider  ?alprazolam (XANAX) 2 MG tablet Take 1 mg by mouth at bedtime as needed for sleep.     [provider]  ?fexofenadine (ALLEGRA) 180 MG tablet Take 180 mg by mouth daily.    [provider]  ?fexofenadine (ALLEGRA) 180 MG tablet Take 180 mg by mouth daily as needed for allergies or rhinitis.    [provider]  ?fluticasone (FLONASE) 50 MCG/ACT nasal spray Place 2 sprays into both nostrils daily. 2 sprays each nostril at night 03/06/21   Rozetta Nunnery, MD  ?oxyCODONE-acetaminophen (PERCOCET/ROXICET) 5-325 MG per tablet Take 2 tablets by mouth every 4 (four) hours as needed for severe pain. ?Patient not taking: Reported  on 08/27/2014 01/29/14   Ripley Fraise, MD  ?   ? ?Allergies    ?Amoxicillin   ? ?Review of Systems   ?Review of Systems ? ?Physical Exam ?Updated Vital Signs ?BP 113/88   Pulse (!) 107   Temp 98.4 ?F (36.9 ?C) (Oral)   Resp 14   SpO2 93%  ?Physical Exam ?Vitals and nursing note reviewed.  ?Constitutional:   ?   General: He is not in acute distress. ?HENT:  ?   Head: Normocephalic and atraumatic.  ?   Nose: Nose normal.  ?   Mouth/Throat:  ?   Mouth: Mucous membranes are dry.  ?Eyes:  ?   General: No scleral icterus. ?Cardiovascular:  ?   Rate and Rhythm: Regular rhythm. Tachycardia present.  ?   Pulses: Normal pulses.  ?   Heart sounds: Normal heart sounds.  ?Pulmonary:  ?   Effort: Pulmonary effort is normal. No respiratory distress.  ?   Breath sounds: No wheezing.  ?Abdominal:  ?   Palpations: Abdomen is soft.  ?   Tenderness: There is no abdominal tenderness. There is no guarding or rebound.  ?Musculoskeletal:  ?   Cervical back: Normal range of motion.  ?   Right lower leg: No edema.  ?   Left lower leg: No edema.  ?Skin: ?   General: Skin is warm and dry.  ?   Capillary Refill:  Capillary refill takes less than 2 seconds.  ?Neurological:  ?   Mental Status: He is alert. Mental status is at baseline.  ?Psychiatric:     ?   Mood and Affect: Mood normal.     ?   Behavior: Behavior normal.  ? ? ?ED Results / Procedures / Treatments   ?Labs ?(all labs ordered are listed, but only abnormal results are displayed) ?Labs Reviewed  ?BASIC METABOLIC PANEL - Abnormal; Notable for the following components:  ?    Result Value  ? Sodium 120 (*)   ? Potassium 3.0 (*)   ? Chloride 68 (*)   ? CO2 42 (*)   ? Glucose, Bld 109 (*)   ? BUN <5 (*)   ? All other components within normal limits  ?CBC WITH DIFFERENTIAL/PLATELET - Abnormal; Notable for the following components:  ? RBC 3.01 (*)   ? Hemoglobin 10.6 (*)   ? HCT 29.0 (*)   ? MCH 35.2 (*)   ? MCHC 36.6 (*)   ? All other components within normal limits  ?MAGNESIUM -  Abnormal; Notable for the following components:  ? Magnesium 1.6 (*)   ? All other components within normal limits  ?HEPATIC FUNCTION PANEL - Abnormal; Notable for the following components:  ? AST 57 (*)   ? Alkaline Phosphatase 146 (*)   ? All other components within normal limits  ?ETHANOL - Abnormal; Notable for the following components:  ? Alcohol, Ethyl (B) 12 (*)   ? All other components within normal limits  ?URINALYSIS, ROUTINE W REFLEX MICROSCOPIC  ?SODIUM, URINE, RANDOM  ?RAPID URINE DRUG SCREEN, HOSP PERFORMED  ? ? ?EKG ?EKG Interpretation ? ?Date/Time:  Wednesday November 06 2021 15:14:37 EDT ?Ventricular Rate:  112 ?PR Interval:  172 ?QRS Duration: 84 ?QT Interval:  363 ?QTC Calculation: 496 ?R Axis:   73 ?Text Interpretation: Sinus tachycardia Borderline prolonged QT interval Nonspecific ST abnormality Confirmed by Lajean Saver [phone]) on 11/06/2021 3:31:32 PM ? ?Radiology ?No results found. ? ?Procedures ?Marland KitchenCritical Care ?Performed by: Tedd Sias, PA ?Authorized by: Tedd Sias, PA  ? ?Critical care provider statement:  ?  Critical care time (minutes):  35 ?  Critical care time was exclusive of:  Separately billable procedures and treating other patients and teaching time ?  Critical care was necessary to treat or prevent imminent or life-threatening deterioration of the following conditions:  Metabolic crisis ?  Critical care was time spent personally by me on the following activities:  Development of treatment plan with patient or surrogate, review of old charts, re-evaluation of patient's condition, pulse oximetry, ordering and review of radiographic studies, ordering and review of laboratory studies, ordering and performing treatments and interventions, obtaining history from patient or surrogate, examination of patient and evaluation of patient's response to treatment ?  Care discussed with: admitting provider    ? ? ?Medications Ordered in ED ?Medications  ?thiamine (B-1) injection 100 mg  (100 mg Intravenous Given 11/06/21 1717)  ?folic acid (FOLVITE) tablet 1 mg (1 mg Oral Given 11/06/21 1717)  ?LORazepam (ATIVAN) injection 0-4 mg (2 mg Intravenous Given 11/06/21 1719)  ?  Or  ?LORazepam (ATIVAN) tablet 0-4 mg ( Oral See Alternative 11/06/21 1719)  ?LORazepam (ATIVAN) injection 0-4 mg (has no administration in time range)  ?  Or  ?LORazepam (ATIVAN) tablet 0-4 mg (has no administration in time range)  ?lactated ringers bolus 1,000 mL (0 mLs Intravenous Stopped 11/06/21 1731)  ?chlordiazePOXIDE (LIBRIUM) capsule 50 mg (  50 mg Oral Given 11/06/21 1717)  ?potassium chloride SA (KLOR-CON M) CR tablet 40 mEq (40 mEq Oral Given 11/06/21 1801)  ?magnesium sulfate IVPB 1 g 100 mL (0 g Intravenous Stopped 11/06/21 1916)  ? ? ?ED Course/ Medical Decision Making/ A&P ?Clinical Course as of 11/06/21 1927  ?Wed Nov 06, 2021  ?1839 Discussed with Dr. Flossie Buffy of hospitalist service who will admit at [facility].  [WF]  ?  ?Clinical Course User Index ?[WF] Tedd Sias, [place]  ? ?                        ?Medical Decision Making ?Amount and/or Complexity of Data Reviewed ?Labs: ordered. ? ?Risk ?Prescription drug management. ?Decision regarding hospitalization. ? ? ?This patient presents to the ED for concern of fatigue, this involves a number of treatment options, and is a complaint that carries with it a moderate to high risk of complications and morbidity.  The differential diagnosis includes The differential diagnosis of weakness includes but is not limited to neurologic causes (GBS, myasthenia gravis, CVA, MS, ALS, transverse myelitis, spinal cord injury, CVA, botulism, ) and other causes: ACS, Arrhythmia, syncope, orthostatic hypotension, sepsis, hypoglycemia, electrolyte disturbance, hypothyroidism, respiratory failure, symptomatic anemia, dehydration, heat injury, polypharmacy, malignancy.  ? ? ?Co morbidities: ?Discussed in HPI ? ? ?Brief History: ? ?Patient is a 52 year old male with a past medical history significant for  anxiety, depression, allergies, alcoholism ? ?Seems that for the past few years patient has been drinking 6 "club tail"  malt beverages per day.  His last alcohol intake was 8 PM yesterday evening.  He describes

## 2021-11-06 NOTE — ED Notes (Signed)
Called [facility] to transport patient to [facility] room 1419 ?

## 2021-11-06 NOTE — ED Notes (Signed)
Attempted to get urine sample. Pt states he still doesn't have to pee and refused to try at this time.  ?

## 2021-11-06 NOTE — ED Notes (Signed)
Patient is resting comfortably. 

## 2021-11-06 NOTE — ED Notes (Signed)
ED Provider at bedside. 

## 2021-11-06 NOTE — H&P (Signed)
?History and Physical  ? ? ?Patient: Austin Brown MRN: [id number] DOA: 11/06/2021 ? ?Date of Service: the patient was seen and examined on 11/07/2021 ? ?Patient coming from:  PCP office ? ?Chief Complaint:  ?Chief Complaint  ?Patient presents with  ? Abnormal Lab  ? ? ?HPI:  ? ?51 year old male with past medical history of asthma, hypertension, prediabetes, nicotine dependence, alcohol abuse who was sent to [facility] from his primary care provider's office after being notified that he had multiple lab abnormalities. ? ?Patient explains that for at least the past month he has been experiencing generalized weakness and fatigue.  Initially this was mild in intensity but seems to have progressively become more severe and is now severe in intensity.  Symptoms are worse with exertion and improved with rest.  Patient denies fevers, sick contacts, recent travel.  Patient denies chest pain shortness of breath or dyspnea on exertion.  Patient complains of associated poor appetite.  Patient additionally reports a longstanding history of drinking at least 6 large "Clubtail" branded malt beverages daily.  Patient reports that his last drink was approximately 8 PM yesterday evening. ? ?Because of patient's worsening symptoms he went to see his primary care provider earlier in the day on 4/19 and was identified to have multiple electrolyte derangements resulting in being instructed to go to [facility] for further evaluation. ? ?Upon evaluation in the emergency department repeat labs confirmed hyponatremia of 120, potassium 3.0 and magnesium of 1.6.  ER provider ordered 1 L bolus of lactated Ringer solution which was administered.  Patient was also given 40 mill equivalents of potassium chloride, 1 g of magnesium sulfate and was initiated on CIWA protocol with scheduled benzodiazepines.  The hospitalist group was then called to assess the patient for admission to the hospital. ? ?Review of Systems: Review of Systems   ?Constitutional:  Positive for malaise/fatigue.  ?Neurological:  Positive for weakness.  ?All other systems reviewed and are negative. ? ? ?Past Medical History:  ?Diagnosis Date  ? Asthma   ? Hypertension   ? ? ?Past Surgical History:  ?Procedure Laterality Date  ? JOINT REPLACEMENT    ? ? ?Social History:  reports that he has been smoking cigarettes. He has been smoking an average of 1.5 packs per day. He uses smokeless tobacco. He reports current alcohol use of about 8.0 standard drinks per week. He reports that he does not use drugs. ? ?Allergies  ?Allergen Reactions  ? Amoxicillin Other (See Comments)  ?  unknown  ? ? ?Family History  ?Problem Relation Age of Onset  ? Heart disease Neg Hx   ? ? ?Prior to Admission medications   ?Medication Sig Start Date End Date Taking? Authorizing Provider  ?acetaminophen (TYLENOL) 500 MG tablet Take 500 mg by mouth every 6 (six) hours as needed for moderate pain.   Yes [provider]  ?albuterol (VENTOLIN HFA) 108 (90 Base) MCG/ACT inhaler Inhale 2 puffs into the lungs every 6 (six) hours as needed for wheezing or shortness of breath. 05/31/21  Yes [provider]  ?Brimonidine Tartrate (LUMIFY OP) Place 1 drop into both eyes daily.   Yes [provider]  ?fexofenadine (ALLEGRA) 180 MG tablet Take 180 mg by mouth daily as needed for allergies or rhinitis.   Yes [provider]  ?fluticasone (FLONASE) 50 MCG/ACT nasal spray Place 2 sprays into both nostrils daily. 2 sprays each nostril at night ?Patient taking differently: Place 2 sprays into both nostrils  at bedtime. 03/06/21  Yes Rozetta Nunnery, MD  ?hydrochlorothiazide (HYDRODIURIL) 25 MG tablet Take 25 mg by mouth every morning. 10/14/21  Yes [provider]  ?potassium chloride SA (KLOR-CON M) 20 MEQ tablet Take 20 mEq by mouth 3 (three) times daily. 09/20/21  Yes [provider]  ?traZODone (DESYREL) 100 MG tablet Take 100 mg by mouth at bedtime as needed for  sleep. 10/14/21  Yes [provider]  ? ? ?Physical Exam: ? ?Vitals:  ? 11/06/21 1830 11/06/21 1949 11/06/21 2100 11/06/21 2218  ?BP: 113/88  106/80 (!) 141/90  ?Pulse: (!) 107 (!) 105 (!) 103 (!) 102  ?Resp: 14  16 16   ?Temp:    98.5 ?F (36.9 ?C)  ?TempSrc:    Oral  ?SpO2: 93%  94% 94%  ?Weight:    113.5 kg  ?Height:    5' 10"  (1.778 m)  ? ? ? ?Constitutional: Awake alert and oriented x3, no associated distress.   ?Skin: no rashes, no lesions, good skin turgor noted. ?Eyes: Pupils are equally reactive to light.  No evidence of scleral icterus or conjunctival pallor.  ?ENMT: Moist mucous membranes noted.  Posterior pharynx clear of any exudate or lesions.   ?Neck: normal, supple, no masses, no thyromegaly.  No evidence of jugular venous distension.   ?Respiratory: clear to auscultation bilaterally, no wheezing, no crackles. Normal respiratory effort. No accessory muscle use.  ?Cardiovascular: Tachycardic rate with regular rhythm, no murmurs / rubs / gallops. No extremity edema. 2+ pedal pulses. No carotid bruits.  ?Chest:   Nontender without crepitus or deformity.   ?Back:   Nontender without crepitus or deformity. ?Abdomen: Abdomen is protuberant but soft and nontender.  No evidence of intra-abdominal masses.  Positive bowel sounds noted in all quadrants.   ?Musculoskeletal: No joint deformity upper and lower extremities. Good ROM, no contractures. Normal muscle tone.  ?Neurologic: CN 2-12 grossly intact. Sensation intact.  Patient moving all 4 extremities spontaneously.  Patient is following all commands.  Patient is responsive to verbal stimuli.   ?Psychiatric: Slightly anxious mood with normal appropriate affect.  Patient seems to possess insight as to their current situation.   ? ?Data Reviewed: ? ?I have personally reviewed and interpreted labs, imaging. ? ?Significant findings are Sodium 120, potassium 3.0, chloride 68, bicarb 42, Magnesium 1.6, hemoglobin 10.6, hematocrit 29 ? ?EKG: Personally  reviewed.  Rhythm is sinus tachycardia with heart rate of 112 bpm.  Slightly prolonged QTc of 496 ms.  No dynamic ST segment changes appreciated. ? ? ?Assessment and Plan: ?* Hyponatremia ?Substantial chronic hyponatremia in a patient with longstanding alcohol abuse which is likely the culprit ?Hyponatremia in addition to other electrolyte abnormalities are likely contributing to patient's symptoms ?ER provider has already administered a liter of lactated Ringer solution. ?We will therefore hold off on any additional fluids until electrolytes are reassessed as target sodium correction should not exceed 8 in a 24-hour period especially considering how chronic the hyponatremia likely is ?Performing serial chemistries to monitor sodium levels closely ?Obtaining serum osmolality, urine osmolality, urine sodium, TSH ? ?Hypokalemia ?Substantial hypokalemia secondary to poor oral intake in setting of ongoing hydrochlorothiazide use ?Holding hydrochlorothiazide ?Replacing potassium  ?Monitoring potassium levels with serial chemistries ?Checking concurrent magnesium levels and replacing as necessary ? ?Hypomagnesemia ?Notable hypomagnesemia of 1.6 on arrival ?ER providers already administered 1 g of magnesium sulfate. ?Reassessing magnesium levels and will continue to replenish as necessary ? ?Alcohol abuse ?Longstanding history of alcohol abuse ?Counseling on cessation ?  Patient is at extremely high risk of alcohol withdrawal ?CIWA protocol initiated with tapering regimen of benzodiazepines with additional doses to be given for evidence of withdrawal ?Monitoring closely on telemetry in the progressive unit ? ?Essential hypertension ?Holding home regimen of hydrochlorothiazide due to substantial hypokalemia ?No scheduled medications at this time as patient appears to be normotensive ?PRN intravenous antihypertensives for excessively elevated blood pressure ? ? ? ?Mild intermittent asthma without complication ?No evidence of  acute asthma exacerbation ?As needed bronchodilator therapy for shortness of breath and wheezing. ? ? ?Prolonged QT interval ?Slightly prolonged QTc of 496 on admission EKG ?Likely mainly due to substantial electrol

## 2021-11-06 NOTE — Progress Notes (Addendum)
Plan of Care Note for accepted transfer ? ? ?Patient: Austin Brown MRN: [id number]   DOA: 11/06/2021 ? ?Facility requesting transfer: [facility] ?Requesting Provider: Dr. Ashok Cordia  ?Reason for transfer: hyponatremia  ?Facility course:  ?51 yo M hx of ETOH abuse, HTN, asthma presented at advise of PCP due to labs showing hyponatremia and hypokalemia.  ?He has been feeling fatigue. Drink 6 "club tails" daily which ED PA reports are 81% alcoholic drinks.  ? ?Looks dehydrated. Na of 120, K of 3 with Mag of 1.6.  ?EKG with prolonged QT 496. ?Has received oral K,IV Mg, I L LR ? ?Last drink 1800 last evening with elevated ETOH. Pt wants to abstain from alcohol and received Librium in ED.No hx of withdrawals.  ? ?Does not appear to be in acute withdrawal and on CIWA protocol.  ? ?Appears depressed and might benefit from psych consult.  ? ?Plan of care: ?The patient is accepted for admission to Progressive unit, at [facility]..  ?ED PA to continue care of patient while in ED.  ? ?Author: Orene Desanctis, DO ?11/06/2021 ? ?Check [email or web address] for on-call coverage. ? ?Nursing staff, Please call [place] number on Amion as soon as patient's arrival, so appropriate admitting provider can evaluate the pt. ?

## 2021-11-06 NOTE — ED Triage Notes (Signed)
Pt was at MD office this am for check-up. He was called 30 minutes ago and told his potassium and sodium were critically low and get to ER. ?

## 2021-11-07 DIAGNOSIS — E860 Dehydration: Secondary | ICD-10-CM | POA: Diagnosis present

## 2021-11-07 DIAGNOSIS — J4520 Mild intermittent asthma, uncomplicated: Secondary | ICD-10-CM | POA: Diagnosis present

## 2021-11-07 DIAGNOSIS — F32A Depression, unspecified: Secondary | ICD-10-CM | POA: Diagnosis present

## 2021-11-07 DIAGNOSIS — E878 Other disorders of electrolyte and fluid balance, not elsewhere classified: Secondary | ICD-10-CM | POA: Diagnosis present

## 2021-11-07 DIAGNOSIS — F10239 Alcohol dependence with withdrawal, unspecified: Secondary | ICD-10-CM | POA: Diagnosis present

## 2021-11-07 DIAGNOSIS — F17210 Nicotine dependence, cigarettes, uncomplicated: Secondary | ICD-10-CM | POA: Diagnosis present

## 2021-11-07 DIAGNOSIS — I10 Essential (primary) hypertension: Secondary | ICD-10-CM | POA: Diagnosis present

## 2021-11-07 DIAGNOSIS — R630 Anorexia: Secondary | ICD-10-CM | POA: Diagnosis present

## 2021-11-07 DIAGNOSIS — E876 Hypokalemia: Secondary | ICD-10-CM | POA: Diagnosis present

## 2021-11-07 DIAGNOSIS — F419 Anxiety disorder, unspecified: Secondary | ICD-10-CM | POA: Diagnosis present

## 2021-11-07 DIAGNOSIS — F17200 Nicotine dependence, unspecified, uncomplicated: Secondary | ICD-10-CM | POA: Diagnosis present

## 2021-11-07 DIAGNOSIS — Z6835 Body mass index (BMI) 35.0-35.9, adult: Secondary | ICD-10-CM | POA: Diagnosis not present

## 2021-11-07 DIAGNOSIS — Z79899 Other long term (current) drug therapy: Secondary | ICD-10-CM | POA: Diagnosis not present

## 2021-11-07 DIAGNOSIS — E871 Hypo-osmolality and hyponatremia: Secondary | ICD-10-CM | POA: Diagnosis present

## 2021-11-07 DIAGNOSIS — Z880 Allergy status to penicillin: Secondary | ICD-10-CM | POA: Diagnosis not present

## 2021-11-07 LAB — BASIC METABOLIC PANEL
Anion gap: 10 (ref 5–15)
Anion gap: 12 (ref 5–15)
Anion gap: 12 (ref 5–15)
Anion gap: 13 (ref 5–15)
Anion gap: 13 (ref 5–15)
Anion gap: 15 (ref 5–15)
BUN: 5 mg/dL — ABNORMAL LOW (ref 6–20)
BUN: 6 mg/dL (ref 6–20)
BUN: 6 mg/dL (ref 6–20)
BUN: 7 mg/dL (ref 6–20)
BUN: <5 mg/dL — ABNORMAL LOW (ref 6–20)
BUN: <5 mg/dL — ABNORMAL LOW (ref 6–20)
CO2: 37 mmol/L — ABNORMAL HIGH (ref 22–32)
CO2: 39 mmol/L — ABNORMAL HIGH (ref 22–32)
CO2: 39 mmol/L — ABNORMAL HIGH (ref 22–32)
CO2: 39 mmol/L — ABNORMAL HIGH (ref 22–32)
CO2: 40 mmol/L — ABNORMAL HIGH (ref 22–32)
CO2: 40 mmol/L — ABNORMAL HIGH (ref 22–32)
Calcium: 8.9 mg/dL (ref 8.9–10.3)
Calcium: 8.9 mg/dL (ref 8.9–10.3)
Calcium: 8.9 mg/dL (ref 8.9–10.3)
Calcium: 8.9 mg/dL (ref 8.9–10.3)
Calcium: 8.9 mg/dL (ref 8.9–10.3)
Calcium: 9 mg/dL (ref 8.9–10.3)
Chloride: 73 mmol/L — ABNORMAL LOW (ref 98–111)
Chloride: 73 mmol/L — ABNORMAL LOW (ref 98–111)
Chloride: 74 mmol/L — ABNORMAL LOW (ref 98–111)
Chloride: 75 mmol/L — ABNORMAL LOW (ref 98–111)
Chloride: 78 mmol/L — ABNORMAL LOW (ref 98–111)
Chloride: 80 mmol/L — ABNORMAL LOW (ref 98–111)
Creatinine, Ser: 0.71 mg/dL (ref 0.61–1.24)
Creatinine, Ser: 0.78 mg/dL (ref 0.61–1.24)
Creatinine, Ser: 0.79 mg/dL (ref 0.61–1.24)
Creatinine, Ser: 0.82 mg/dL (ref 0.61–1.24)
Creatinine, Ser: 0.86 mg/dL (ref 0.61–1.24)
Creatinine, Ser: 1 mg/dL (ref 0.61–1.24)
GFR, Estimated: >60 mL/min (ref >60)
GFR, Estimated: >60 mL/min (ref >60)
GFR, Estimated: >60 mL/min (ref >60)
GFR, Estimated: >60 mL/min (ref >60)
GFR, Estimated: >60 mL/min (ref >60)
GFR, Estimated: >60 mL/min (ref >60)
Glucose, Bld: 102 mg/dL — ABNORMAL HIGH (ref 70–99)
Glucose, Bld: 104 mg/dL — ABNORMAL HIGH (ref 70–99)
Glucose, Bld: 105 mg/dL — ABNORMAL HIGH (ref 70–99)
Glucose, Bld: 116 mg/dL — ABNORMAL HIGH (ref 70–99)
Glucose, Bld: 130 mg/dL — ABNORMAL HIGH (ref 70–99)
Glucose, Bld: 136 mg/dL — ABNORMAL HIGH (ref 70–99)
Potassium: 2.9 mmol/L — ABNORMAL LOW (ref 3.5–5.1)
Potassium: 2.9 mmol/L — ABNORMAL LOW (ref 3.5–5.1)
Potassium: 3.2 mmol/L — ABNORMAL LOW (ref 3.5–5.1)
Potassium: 3.4 mmol/L — ABNORMAL LOW (ref 3.5–5.1)
Potassium: 3.4 mmol/L — ABNORMAL LOW (ref 3.5–5.1)
Potassium: 3.5 mmol/L (ref 3.5–5.1)
Sodium: 125 mmol/L — ABNORMAL LOW (ref 135–145)
Sodium: 125 mmol/L — ABNORMAL LOW (ref 135–145)
Sodium: 127 mmol/L — ABNORMAL LOW (ref 135–145)
Sodium: 127 mmol/L — ABNORMAL LOW (ref 135–145)
Sodium: 129 mmol/L — ABNORMAL LOW (ref 135–145)
Sodium: 129 mmol/L — ABNORMAL LOW (ref 135–145)

## 2021-11-07 LAB — CBC WITH DIFFERENTIAL/PLATELET
Abs Immature Granulocytes: 0.09 10*3/uL — ABNORMAL HIGH (ref 0.00–0.07)
Basophils Absolute: 0 10*3/uL (ref 0.0–0.1)
Basophils Relative: 0 %
Eosinophils Absolute: 0.1 10*3/uL (ref 0.0–0.5)
Eosinophils Relative: 1 %
HCT: 30.2 % — ABNORMAL LOW (ref 39.0–52.0)
Hemoglobin: 10.9 g/dL — ABNORMAL LOW (ref 13.0–17.0)
Immature Granulocytes: 1 %
Lymphocytes Relative: 12 %
Lymphs Abs: 0.9 10*3/uL (ref 0.7–4.0)
MCH: 36.2 pg — ABNORMAL HIGH (ref 26.0–34.0)
MCHC: 36.1 g/dL — ABNORMAL HIGH (ref 30.0–36.0)
MCV: 100.3 fL — ABNORMAL HIGH (ref 80.0–100.0)
Monocytes Absolute: 1 10*3/uL (ref 0.1–1.0)
Monocytes Relative: 12 %
Neutro Abs: 5.9 10*3/uL (ref 1.7–7.7)
Neutrophils Relative %: 74 %
Platelets: 187 10*3/uL (ref 150–400)
RBC: 3.01 MIL/uL — ABNORMAL LOW (ref 4.22–5.81)
RDW: 15 % (ref 11.5–15.5)
WBC: 8 10*3/uL (ref 4.0–10.5)
nRBC: 0 % (ref 0.0–0.2)

## 2021-11-07 LAB — HIV ANTIBODY (ROUTINE TESTING W REFLEX): HIV Screen 4th Generation wRfx: NONREACTIVE

## 2021-11-07 LAB — CORTISOL-AM, BLOOD: Cortisol - AM: 9.1 ug/dL (ref 6.7–22.6)

## 2021-11-07 LAB — TSH: TSH: 3.43 u[IU]/mL (ref 0.350–4.500)

## 2021-11-07 LAB — MAGNESIUM: Magnesium: 1.9 mg/dL (ref 1.7–2.4)

## 2021-11-07 LAB — OSMOLALITY, URINE: Osmolality, Ur: 118 mOsm/kg — ABNORMAL LOW (ref 300–900)

## 2021-11-07 LAB — OSMOLALITY: Osmolality: 261 mOsm/kg — ABNORMAL LOW (ref 275–295)

## 2021-11-07 MED ORDER — ACETAMINOPHEN 650 MG RE SUPP
650.0000 mg | Freq: Four times a day (QID) | RECTAL | Status: DC | PRN
Start: 2021-11-07 — End: 2021-11-09

## 2021-11-07 MED ORDER — POTASSIUM CHLORIDE CRYS ER 20 MEQ PO TBCR
40.0000 meq | EXTENDED_RELEASE_TABLET | Freq: Once | ORAL | Status: AC
  Administered 2021-11-07: 40 meq via ORAL
  Filled 2021-11-07: qty 2

## 2021-11-07 MED ORDER — MELATONIN 5 MG PO TABS: 10.0000 mg | ORAL_TABLET | Freq: Every evening | ORAL | Status: DC | PRN

## 2021-11-07 MED ORDER — FLUTICASONE PROPIONATE 50 MCG/ACT NA SUSP
2.0000 | Freq: Every day | NASAL | Status: DC
  Administered 2021-11-08: 2 via NASAL
  Filled 2021-11-07: qty 16

## 2021-11-07 MED ORDER — ENOXAPARIN SODIUM 40 MG/0.4ML IJ SOSY
40.0000 mg | PREFILLED_SYRINGE | INTRAMUSCULAR | Status: DC
  Administered 2021-11-07 – 2021-11-09 (×3): 40 mg via SUBCUTANEOUS
  Filled 2021-11-07 (×3): qty 0.4

## 2021-11-07 MED ORDER — NICOTINE POLACRILEX 2 MG MT GUM
2.0000 mg | CHEWING_GUM | Freq: Once | OROMUCOSAL | Status: AC
  Administered 2021-11-07: 2 mg via ORAL
  Filled 2021-11-07: qty 1

## 2021-11-07 MED ORDER — POTASSIUM CHLORIDE CRYS ER 20 MEQ PO TBCR
40.0000 meq | EXTENDED_RELEASE_TABLET | ORAL | Status: AC
  Administered 2021-11-07 (×2): 40 meq via ORAL
  Filled 2021-11-07 (×2): qty 2

## 2021-11-07 MED ORDER — ALBUTEROL SULFATE (2.5 MG/3ML) 0.083% IN NEBU: 3.0000 mL | INHALATION_SOLUTION | Freq: Four times a day (QID) | RESPIRATORY_TRACT | Status: DC | PRN

## 2021-11-07 MED ORDER — ACETAMINOPHEN 325 MG PO TABS: 650.0000 mg | ORAL_TABLET | Freq: Four times a day (QID) | ORAL | Status: DC | PRN

## 2021-11-07 MED ORDER — BRIMONIDINE TARTRATE 0.2 % OP SOLN
Freq: Every day | OPHTHALMIC | Status: DC
Start: 2021-11-07 — End: 2021-11-07
  Filled 2021-11-07: qty 5

## 2021-11-07 MED ORDER — POLYETHYLENE GLYCOL 3350 17 G PO PACK: 17.0000 g | PACK | Freq: Every day | ORAL | Status: DC | PRN

## 2021-11-07 MED ORDER — DEXTROSE 5 % IV SOLN: INTRAVENOUS | Status: DC

## 2021-11-07 MED ORDER — THIAMINE HCL 100 MG PO TABS
100.0000 mg | ORAL_TABLET | Freq: Every day | ORAL | Status: DC
  Administered 2021-11-08 – 2021-11-09 (×2): 100 mg via ORAL
  Filled 2021-11-07 (×2): qty 1

## 2021-11-07 MED ORDER — BRIMONIDINE TARTRATE 0.025 % OP SOLN: 1.0000 [drp] | Freq: Every day | OPHTHALMIC | Status: DC

## 2021-11-07 MED ORDER — NICOTINE 21 MG/24HR TD PT24
21.0000 mg | MEDICATED_PATCH | Freq: Every day | TRANSDERMAL | Status: DC
  Administered 2021-11-07 – 2021-11-09 (×3): 21 mg via TRANSDERMAL
  Filled 2021-11-07 (×4): qty 1

## 2021-11-07 MED ORDER — ALUM & MAG HYDROXIDE-SIMETH 200-200-20 MG/5ML PO SUSP
30.0000 mL | ORAL | Status: DC | PRN
  Administered 2021-11-07: 30 mL via ORAL
  Filled 2021-11-07: qty 30

## 2021-11-07 MED ORDER — LORATADINE 10 MG PO TABS
10.0000 mg | ORAL_TABLET | Freq: Every day | ORAL | Status: DC | PRN
Start: 2021-11-06 — End: 2021-11-09

## 2021-11-07 NOTE — Assessment & Plan Note (Signed)
?   Substantial chronic hyponatremia in a patient with longstanding alcohol abuse which is likely the culprit ?? Hyponatremia in addition to other electrolyte abnormalities are likely contributing to patient's symptoms ?? ER provider has already administered a liter of lactated Ringer solution. ?? We will therefore hold off on any additional fluids until electrolytes are reassessed as target sodium correction should not exceed 8 in a 24-hour period especially considering how chronic the hyponatremia likely is ?? Performing serial chemistries to monitor sodium levels closely ?? Obtaining serum osmolality, urine osmolality, urine sodium, TSH ?

## 2021-11-07 NOTE — Assessment & Plan Note (Signed)
?   Notable hypomagnesemia of 1.6 on arrival ?? ER providers already administered 1 g of magnesium sulfate. ?? Reassessing magnesium levels and will continue to replenish as necessary ?

## 2021-11-07 NOTE — Assessment & Plan Note (Signed)
?   No evidence of acute asthma exacerbation ?? As needed bronchodilator therapy for shortness of breath and wheezing. ? ?

## 2021-11-07 NOTE — Assessment & Plan Note (Signed)
?   Holding home regimen of hydrochlorothiazide due to substantial hypokalemia ?? No scheduled medications at this time as patient appears to be normotensive ?? PRN intravenous antihypertensives for excessively elevated blood pressure ? ? ?

## 2021-11-07 NOTE — Assessment & Plan Note (Signed)
?   Longstanding history of alcohol abuse ?? Counseling on cessation ?? Patient is at extremely high risk of alcohol withdrawal ?? CIWA protocol initiated with tapering regimen of benzodiazepines with additional doses to be given for evidence of withdrawal ?? Monitoring closely on telemetry in the progressive unit ?

## 2021-11-07 NOTE — Assessment & Plan Note (Signed)
?   Substantial hypokalemia secondary to poor oral intake in setting of ongoing hydrochlorothiazide use ?? Holding hydrochlorothiazide ?? Replacing potassium  ?? Monitoring potassium levels with serial chemistries ?? Checking concurrent magnesium levels and replacing as necessary ?

## 2021-11-07 NOTE — Assessment & Plan Note (Signed)
?   Counseling on cessation daily ?

## 2021-11-07 NOTE — Progress Notes (Addendum)
?PROGRESS NOTE ? ? ? ?Austin Brown  IHK:[id number] DOB: Mar 23, 1971 DOA: 11/06/2021 ?PCP: Deland Pretty, MD  ? ?Brief Narrative: ?51 year old with past medical history significant for asthma, hypertension, prediabetic, nicotine dependence, alcohol abuse who presents with multiple labs abnormalities, generalized weakness and fatigue, poor appetite.  He continues to drink alcohol.  He also presented for detox. ? ?He was found to have multiple electrolytes abnormality, hyponatremia with a sodium of 120, hypokalemia, hypomagnesemia. ? ? ?Assessment & Plan: ?  ?Principal Problem: ?  Hyponatremia ?Active Problems: ?  Hypokalemia ?  Hypomagnesemia ?  Alcohol abuse ?  Essential hypertension ?  Mild intermittent asthma without complication ?  Prolonged QT interval ?  Nicotine dependence ? ?1-Hyponatremia: ?Probably related to alcohol use, dehydration and HCTZ ?Sodium on admission 120.  He received 1 L of LR in the ED.  Sodium has improved to 127. ?At this point we will continue to hold on starting IV fluids to prevent correction more than 8 mEq in 24 hours. ?Continue to check treatment every 4 hours ?T-cell normal 9.1, TSH 3.4. ? ? ?2-Alcohol abuse alcohol withdrawal; ?Continue with CIWA protocol, Ativan. ?Continue with folic acid and thiamine. ?Social worker consulted for counseling ? ?3-Hypomagnesemia: Received 1 g of IV magnesium.  Repeat level in am.  ? ?4-Hypokalemia: Continue to hold hydrochlorothiazide.  Continue to replete potassium orally ? ?5-Hypertension: Continue to hold hydrochlorothiazide due to hyponatremia and hypokalemia. ? ?6-Mild transaminases; in setting of alcohol use. Follow trend.  ? ?Mild intermittent Asthma:  without complication.  ?PRN nebulizer.  ? ?Prolong QT; replete electrolytes. Repeat EKG in am.  ? ?Nicotine dependence. Cessation provided.  ? ?Estimated body mass index is 35.9 kg/m? as calculated from the following: ?  Height as of this encounter: 5' 10"  (1.778 m). ?  Weight as of this  encounter: 113.5 kg. ? ? ?DVT prophylaxis: Lovenox ?Code Status: Full code ?Family Communication: care discussed with patient.  ?Disposition Plan:  ?Status is: Inpatient ?Remains inpatient appropriate because: admitted for hyponatremia, alcohol withdrawal.  ? ? ? ?Consultants:  ?None ? ?Procedures:  ?None ?Antimicrobials:  ? ? ?Subjective: ?He feels anxious , just received ativan.  ? ?Objective: ?Vitals:  ? 11/07/21 7564 11/07/21 0235 11/07/21 3329 11/07/21 0553  ?BP:   114/69 114/69  ?Pulse:   (!) 107 (!) 107  ?Resp: 15 16  18   ?Temp:    98.1 ?F (36.7 ?C)  ?TempSrc:    Oral  ?SpO2:    92%  ?Weight:      ?Height:      ? ? ?Intake/Output Summary (Last 24 hours) at 11/07/2021 0929 ?Last data filed at 11/07/2021 0000 ?Gross per 24 hour  ?Intake 1096.86 ml  ?Output 400 ml  ?Net 696.86 ml  ? ?Filed Weights  ? 11/06/21 2218  ?Weight: 113.5 kg  ? ? ?Examination: ? ?General exam: Appears calm and comfortable  ?Respiratory system: Clear to auscultation. Respiratory effort normal. ?Cardiovascular system: S1 & S2 heard, RRR.  ?Gastrointestinal system: Abdomen is nondistended, soft and nontender. No organomegaly or masses felt. Normal bowel sounds heard. ?Central nervous system: Alert  ?Extremities: Symmetric 5 x 5 power. ? ? ?Data Reviewed: I have personally reviewed following labs and imaging studies ? ?CBC: ?Recent Labs  ?Lab 11/06/21 ?1553 11/07/21 ?0016  ?WBC 8.1 8.0  ?NEUTROABS 6.3 5.9  ?HGB 10.6* 10.9*  ?HCT 29.0* 30.2*  ?MCV 96.3 100.3*  ?PLT 203 187  ? ?Basic Metabolic Panel: ?Recent Labs  ?Lab 11/06/21 ?1553 11/07/21 ?5188 11/07/21 ?4166  ?  NA 120* 125*  125* 127*  ?K 3.0* 2.9*  2.9* 3.2*  ?CL 68* 73*  73* 74*  ?CO2 42* 40*  37* 40*  ?GLUCOSE 109* 105*  104* 116*  ?BUN <5* 6  6 <5*  ?CREATININE 1.07 0.82  0.71 0.78  ?CALCIUM 9.7 9.0  8.9 8.9  ?MG 1.6* 1.9  --   ? ?GFR: ?Estimated Creatinine Clearance: 140.9 mL/min (by C-G formula based on SCr of 0.78 mg/dL). ?Liver Function Tests: ?Recent Labs  ?Lab  11/06/21 ?1647  ?AST 57*  ?ALT 13  ?ALKPHOS 146*  ?BILITOT 0.6  ?PROT 6.9  ?ALBUMIN 3.5  ? ?No results for input(s): LIPASE, AMYLASE in the last 168 hours. ?No results for input(s): AMMONIA in the last 168 hours. ?Coagulation Profile: ?No results for input(s): INR, PROTIME in the last 168 hours. ?Cardiac Enzymes: ?No results for input(s): CKTOTAL, CKMB, CKMBINDEX, TROPONINI in the last 168 hours. ?BNP (last 3 results) ?No results for input(s): PROBNP in the last 8760 hours. ?HbA1C: ?No results for input(s): HGBA1C in the last 72 hours. ?CBG: ?No results for input(s): GLUCAP in the last 168 hours. ?Lipid Profile: ?No results for input(s): CHOL, HDL, LDLCALC, TRIG, CHOLHDL, LDLDIRECT in the last 72 hours. ?Thyroid Function Tests: ?Recent Labs  ?  11/07/21 ?0016  ?TSH 3.430  ? ?Anemia Panel: ?No results for input(s): VITAMINB12, FOLATE, FERRITIN, TIBC, IRON, RETICCTPCT in the last 72 hours. ?Sepsis Labs: ?No results for input(s): PROCALCITON, LATICACIDVEN in the last 168 hours. ? ?No results found for this or any previous visit (from the past 240 hour(s)).  ? ? ? ? ? ?Radiology Studies: ?No results found. ? ? ? ? ? ?Scheduled Meds: ? Brimonidine Tartrate  1 drop Ophthalmic Daily  ? enoxaparin (LOVENOX) injection  40 mg Subcutaneous Q24H  ? fluticasone  2 spray Each Nare QHS  ? folic acid  1 mg Oral Daily  ? LORazepam  0-4 mg Intravenous Q6H  ? Or  ? LORazepam  0-4 mg Oral Q6H  ? [START ON 11/09/2021] LORazepam  0-4 mg Intravenous Q12H  ? Or  ? [START ON 11/09/2021] LORazepam  0-4 mg Oral Q12H  ? nicotine  21 mg Transdermal Daily  ? thiamine injection  100 mg Intravenous Daily  ? ?Continuous Infusions: ? ? LOS: 0 days  ? ? ?Time spent: 35 minutes ? ? ? ?Elmarie Shiley, MD ?[facility] ? ? ?If 7PM-7AM, please contact night-coverage ?[email or web address] ? ?11/07/2021, 9:29 AM   ?

## 2021-11-07 NOTE — Assessment & Plan Note (Signed)
?   Slightly prolonged QTc of 496 on admission EKG ?? Likely mainly due to substantial electrolyte abnormalities ?? Monitoring patient on telemetry ?? Correcting electrolyte abnormality ?? Avoiding QT prolonging agents at this time, holding trazodone ?

## 2021-11-08 DIAGNOSIS — E871 Hypo-osmolality and hyponatremia: Secondary | ICD-10-CM | POA: Diagnosis not present

## 2021-11-08 LAB — BASIC METABOLIC PANEL
Anion gap: 11 (ref 5–15)
BUN: 6 mg/dL (ref 6–20)
CO2: 38 mmol/L — ABNORMAL HIGH (ref 22–32)
Calcium: 9.2 mg/dL (ref 8.9–10.3)
Chloride: 83 mmol/L — ABNORMAL LOW (ref 98–111)
Creatinine, Ser: 0.98 mg/dL (ref 0.61–1.24)
GFR, Estimated: >60 mL/min (ref >60)
Glucose, Bld: 105 mg/dL — ABNORMAL HIGH (ref 70–99)
Potassium: 3.5 mmol/L (ref 3.5–5.1)
Sodium: 132 mmol/L — ABNORMAL LOW (ref 135–145)

## 2021-11-08 LAB — CBC
HCT: 32.8 % — ABNORMAL LOW (ref 39.0–52.0)
Hemoglobin: 11.3 g/dL — ABNORMAL LOW (ref 13.0–17.0)
MCH: 36.6 pg — ABNORMAL HIGH (ref 26.0–34.0)
MCHC: 34.5 g/dL (ref 30.0–36.0)
MCV: 106.1 fL — ABNORMAL HIGH (ref 80.0–100.0)
Platelets: 190 10*3/uL (ref 150–400)
RBC: 3.09 MIL/uL — ABNORMAL LOW (ref 4.22–5.81)
RDW: 16.7 % — ABNORMAL HIGH (ref 11.5–15.5)
WBC: 8.5 10*3/uL (ref 4.0–10.5)
nRBC: 0 % (ref 0.0–0.2)

## 2021-11-08 LAB — MAGNESIUM: Magnesium: 2.2 mg/dL (ref 1.7–2.4)

## 2021-11-08 MED ORDER — LORAZEPAM 2 MG/ML IJ SOLN
1.0000 mg | Freq: Once | INTRAMUSCULAR | Status: AC
  Administered 2021-11-08: 1 mg via INTRAVENOUS
  Filled 2021-11-08 (×2): qty 1

## 2021-11-08 NOTE — Progress Notes (Signed)
Pt's IV was pulled out resulting in moderate pool of blood. Pt HR went up to 140s and sustained in 110-120s after event when pt was sleeping. Charge nurse and on-call provider notified. Orders given, see MAR. Will continue to monitor and use Yellow MEWS protocol. ? 11/08/21 0207  ?Assess: MEWS Score  ?Temp 97.9 ?F (36.6 ?C)  ?BP (!) 127/99  ?Pulse Rate (!) 131  ?Resp 15  ?SpO2 91 %  ?O2 Device Room Air  ?Assess: MEWS Score  ?MEWS Temp 0  ?MEWS Systolic 0  ?MEWS Pulse 3  ?MEWS RR 0  ?MEWS LOC 0  ?MEWS Score 3  ?MEWS Score Color Yellow  ?Assess: if the MEWS score is Yellow or Red  ?Were vital signs taken at a resting state? No  ?Focused Assessment No change from prior assessment  ?Does the patient meet 2 or more of the SIRS criteria? No  ?MEWS guidelines implemented *See Row Information* No, vital signs rechecked  ?Assess: SIRS CRITERIA  ?SIRS Temperature  0  ?SIRS Pulse 1  ?SIRS Respirations  0  ?SIRS WBC 0  ?SIRS Score Sum  1  ? ? ?

## 2021-11-08 NOTE — Progress Notes (Signed)
?  Transition of Care (TOC) Screening Note ? ? ?Patient Details  ?Name: Austin Brown ?Date of Birth: 22-May-1971 ? ? ?Transition of Care [facility]) CM/SW Contact:    ?Dessa Phi, RN ?Phone Number: ?11/08/2021, 12:38 PM ? ? ? ?Transition of Care Department [facility]) has reviewed patient and no TOC needs have been identified at this time. We will continue to monitor patient advancement through interdisciplinary progression rounds. If new patient transition needs arise, please place a TOC consult. ?  ?

## 2021-11-08 NOTE — Progress Notes (Signed)
?PROGRESS NOTE ? ? ? ?Austin Brown  IHK:[id number] DOB: Apr 22, 1971 DOA: 11/06/2021 ?PCP: Deland Pretty, MD  ? ?Brief Narrative: ?51 year old with past medical history significant for asthma, hypertension, prediabetic, nicotine dependence, alcohol abuse who presents with multiple labs abnormalities, generalized weakness and fatigue, poor appetite.  He continues to drink alcohol.  He also presented for detox. ? ?He was found to have multiple electrolytes abnormality, hyponatremia with a sodium of 120, hypokalemia, hypomagnesemia. ? ? ?Assessment & Plan: ?  ?Principal Problem: ?  Hyponatremia ?Active Problems: ?  Hypokalemia ?  Hypomagnesemia ?  Alcohol abuse ?  Essential hypertension ?  Mild intermittent asthma without complication ?  Prolonged QT interval ?  Nicotine dependence ? ?1-Hyponatremia: ?Probably related to alcohol use, dehydration and HCTZ ?Sodium on admission 120.  He received 1 L of LR in the ED.  Sodium has improved to 127. ?Cortisol  9.1, TSH 3.4. ?Sodium increase to 29 yesterday. Awaiting Bmet for today.  ? ?2-Alcohol abuse alcohol withdrawal; ?Continue with CIWA protocol, Ativan. ?Continue with folic acid and thiamine. ?Social worker consulted for counseling ? ?3-Hypomagnesemia: replaced.  ? ?4-Hypokalemia: Continue to hold hydrochlorothiazide.\ ?Replaced. Awaiting Bmet.  ? ?5-Hypertension: Continue to hold hydrochlorothiazide due to hyponatremia and hypokalemia. ? ?6-Mild transaminases; in setting of alcohol use. Follow trend.  ? ?Mild intermittent Asthma:  without complication.  ?PRN nebulizer.  ? ?Prolong QT; replete electrolytes. QT 443 improved ? ?Nicotine dependence. Cessation provided.  ? ?Estimated body mass index is 35.9 kg/m? as calculated from the following: ?  Height as of this encounter: 5' 10"  (1.778 m). ?  Weight as of this encounter: 113.5 kg. ? ? ?DVT prophylaxis: Lovenox ?Code Status: Full code ?Family Communication: care discussed with patient.  ?Disposition Plan:  ?Status is:  Inpatient ?Remains inpatient appropriate because: admitted for hyponatremia, alcohol withdrawal.  ? ? ? ?Consultants:  ?None ? ?Procedures:  ?None ?Antimicrobials:  ? ? ?Subjective: ?He is feeling better. Mild tremors.  ? ?Objective: ?Vitals:  ? 11/08/21 0240 11/08/21 0417 11/08/21 0604 11/08/21 0947  ?BP:  (!) 135/106 124/90 126/81  ?Pulse:  (!) 124 (!) 108 (!) 109  ?Resp: 19 20 16 20   ?Temp:   98 ?F (36.7 ?C) 98.4 ?F (36.9 ?C)  ?TempSrc:   Oral Oral  ?SpO2:  90% 93% 95%  ?Weight:      ?Height:      ? ? ?Intake/Output Summary (Last 24 hours) at 11/08/2021 1450 ?Last data filed at 11/07/2021 2200 ?Gross per 24 hour  ?Intake 340 ml  ?Output --  ?Net 340 ml  ? ? ?Filed Weights  ? 11/06/21 2218  ?Weight: 113.5 kg  ? ? ?Examination: ? ?General exam: NAD ?Respiratory system: CTA ?Cardiovascular system: S 1, S 2 RRR ?Gastrointestinal system: BS present, soft, nt ?Central nervous system: Alert ? ? ? ?Data Reviewed: I have personally reviewed following labs and imaging studies ? ?CBC: ?Recent Labs  ?Lab 11/06/21 ?1553 11/07/21 ?7564 11/08/21 ?3329  ?WBC 8.1 8.0 8.5  ?NEUTROABS 6.3 5.9  --   ?HGB 10.6* 10.9* 11.3*  ?HCT 29.0* 30.2* 32.8*  ?MCV 96.3 100.3* 106.1*  ?PLT 203 187 190  ? ? ?Basic Metabolic Panel: ?Recent Labs  ?Lab 11/06/21 ?1553 11/07/21 ?0016 11/07/21 ?5188 11/07/21 ?4166 11/07/21 ?1400 11/07/21 ?2144 11/08/21 ?0438  ?NA 120* 125*  125* 127* 127* 129* 129*  --   ?K 3.0* 2.9*  2.9* 3.2* 3.4* 3.4* 3.5  --   ?CL 68* 73*  73* 74* 75* 78* 80*  --   ?  CO2 42* 40*  37* 40* 39* 39* 39*  --   ?GLUCOSE 109* 105*  104* 116* 136* 130* 102*  --   ?BUN <5* 6  6 <5* <5* 5* 7  --   ?CREATININE 1.07 0.82  0.71 0.78 0.79 1.00 0.86  --   ?CALCIUM 9.7 9.0  8.9 8.9 8.9 8.9 8.9  --   ?MG 1.6* 1.9  --   --   --   --  2.2  ? ? ?GFR: ?Estimated Creatinine Clearance: 131.1 mL/min (by C-G formula based on SCr of 0.86 mg/dL). ?Liver Function Tests: ?Recent Labs  ?Lab 11/06/21 ?1647  ?AST 57*  ?ALT 13  ?ALKPHOS 146*  ?BILITOT 0.6   ?PROT 6.9  ?ALBUMIN 3.5  ? ? ?No results for input(s): LIPASE, AMYLASE in the last 168 hours. ?No results for input(s): AMMONIA in the last 168 hours. ?Coagulation Profile: ?No results for input(s): INR, PROTIME in the last 168 hours. ?Cardiac Enzymes: ?No results for input(s): CKTOTAL, CKMB, CKMBINDEX, TROPONINI in the last 168 hours. ?BNP (last 3 results) ?No results for input(s): PROBNP in the last 8760 hours. ?HbA1C: ?No results for input(s): HGBA1C in the last 72 hours. ?CBG: ?No results for input(s): GLUCAP in the last 168 hours. ?Lipid Profile: ?No results for input(s): CHOL, HDL, LDLCALC, TRIG, CHOLHDL, LDLDIRECT in the last 72 hours. ?Thyroid Function Tests: ?Recent Labs  ?  11/07/21 ?0016  ?TSH 3.430  ? ? ?Anemia Panel: ?No results for input(s): VITAMINB12, FOLATE, FERRITIN, TIBC, IRON, RETICCTPCT in the last 72 hours. ?Sepsis Labs: ?No results for input(s): PROCALCITON, LATICACIDVEN in the last 168 hours. ? ?No results found for this or any previous visit (from the past 240 hour(s)).  ? ? ? ? ? ?Radiology Studies: ?No results found. ? ? ? ? ? ?Scheduled Meds: ? Brimonidine Tartrate  1 drop Ophthalmic Daily  ? enoxaparin (LOVENOX) injection  40 mg Subcutaneous Q24H  ? fluticasone  2 spray Each Nare QHS  ? folic acid  1 mg Oral Daily  ? LORazepam  0-4 mg Intravenous Q6H  ? Or  ? LORazepam  0-4 mg Oral Q6H  ? [START ON 11/09/2021] LORazepam  0-4 mg Intravenous Q12H  ? Or  ? [START ON 11/09/2021] LORazepam  0-4 mg Oral Q12H  ? nicotine  21 mg Transdermal Daily  ? thiamine  100 mg Oral Daily  ? ?Continuous Infusions: ? ? LOS: 1 day  ? ? ?Time spent: 35 minutes ? ? ? ?Elmarie Shiley, MD ?[facility] ? ? ?If 7PM-7AM, please contact night-coverage ?[email or web address] ? ?11/08/2021, 2:50 PM   ?

## 2021-11-09 DIAGNOSIS — E871 Hypo-osmolality and hyponatremia: Secondary | ICD-10-CM | POA: Diagnosis not present

## 2021-11-09 LAB — BASIC METABOLIC PANEL
Anion gap: 10 (ref 5–15)
BUN: 7 mg/dL (ref 6–20)
CO2: 37 mmol/L — ABNORMAL HIGH (ref 22–32)
Calcium: 8.8 mg/dL — ABNORMAL LOW (ref 8.9–10.3)
Chloride: 86 mmol/L — ABNORMAL LOW (ref 98–111)
Creatinine, Ser: 0.72 mg/dL (ref 0.61–1.24)
GFR, Estimated: >60 mL/min (ref >60)
Glucose, Bld: 106 mg/dL — ABNORMAL HIGH (ref 70–99)
Potassium: 3.3 mmol/L — ABNORMAL LOW (ref 3.5–5.1)
Sodium: 133 mmol/L — ABNORMAL LOW (ref 135–145)

## 2021-11-09 MED ORDER — POTASSIUM CHLORIDE CRYS ER 20 MEQ PO TBCR: 40.0000 meq | EXTENDED_RELEASE_TABLET | Freq: Every day | ORAL | 0 refills | Status: DC

## 2021-11-09 MED ORDER — FOLIC ACID 1 MG PO TABS: 1.0000 mg | ORAL_TABLET | Freq: Every day | ORAL | 0 refills | Status: DC

## 2021-11-09 MED ORDER — THIAMINE HCL 100 MG PO TABS: 100.0000 mg | ORAL_TABLET | Freq: Every day | ORAL | 0 refills | Status: DC

## 2021-11-09 MED ORDER — CARVEDILOL 3.125 MG PO TABS
3.1250 mg | ORAL_TABLET | Freq: Two times a day (BID) | ORAL | Status: DC
Start: 2021-11-09 — End: 2021-11-09
  Administered 2021-11-09: 3.125 mg via ORAL
  Filled 2021-11-09: qty 1

## 2021-11-09 MED ORDER — NICOTINE 21 MG/24HR TD PT24: 21.0000 mg | MEDICATED_PATCH | Freq: Every day | TRANSDERMAL | 0 refills | Status: DC

## 2021-11-09 MED ORDER — CARVEDILOL 3.125 MG PO TABS
3.1250 mg | ORAL_TABLET | Freq: Two times a day (BID) | ORAL | Status: DC
Start: 2021-11-09 — End: 2021-11-09

## 2021-11-09 MED ORDER — CARVEDILOL 3.125 MG PO TABS: 3.1250 mg | ORAL_TABLET | Freq: Two times a day (BID) | ORAL | 0 refills | Status: DC

## 2021-11-09 MED ORDER — POTASSIUM CHLORIDE CRYS ER 20 MEQ PO TBCR
40.0000 meq | EXTENDED_RELEASE_TABLET | ORAL | Status: DC
  Administered 2021-11-09: 40 meq via ORAL
  Filled 2021-11-09: qty 2

## 2021-11-09 NOTE — Discharge Summary (Signed)
Physician Discharge Summary  ?Austin Brown NUU:[id number] DOB: 1971-03-02 DOA: 11/06/2021 ? ?PCP: Deland Pretty, MD ? ?Admit date: 11/06/2021 ?Discharge date: 11/09/2021 ? ?Admitted From: Home  ?Disposition: Home  ? ?Recommendations for Outpatient Follow-up:  ?Follow up with PCP in 1-2 weeks ?Please obtain BMP/CBC in one week ?Encourage alcohol cessation.  ? ?Home Health: none ? ?Discharge Condition: Stable ?CODE STATUS: Full code ?Diet recommendation: Heart Healthy ? ?Brief/Interim Summary: ?51 year old with past medical history significant for asthma, hypertension, prediabetic, nicotine dependence, alcohol abuse who presents with multiple labs abnormalities, generalized weakness and fatigue, poor appetite.  He continues to drink alcohol.  He also presented for detox. ?  ?He was found to have multiple electrolytes abnormality, hyponatremia with a sodium of 120, hypokalemia, hypomagnesemia. ?  ?  ?1-Hyponatremia: ?Probably related to alcohol use, dehydration and HCTZ ?Sodium on admission 120.  He received 1 L of LR in the ED.  Sodium has improved to 133 ?Cortisol  9.1, TSH 3.4. ?Improved.  ?  ?2-Alcohol abuse alcohol withdrawal; ?Completed CIWA protocol, Ativan. ?Continue with folic acid and thiamine. ?Social worker consulted for counseling ?Stable.  ? ?3-Hypomagnesemia: replaced.  ?  ?4-Hypokalemia: Continue to hold hydrochlorothiazide.\ ?Improved. Discharge on 3 days of potassium supplement.  ?  ?5-Hypertension: Continue to hold hydrochlorothiazide due to hyponatremia and hypokalemia. ?Start low dose Carvedilol.  ?  ?6-Mild transaminases; in setting of alcohol use. Follow trend.  ?  ?Mild intermittent Asthma:  without complication.  ?PRN nebulizer.  ?  ?Prolong QT; replete electrolytes. QT 443 improved ?  ?Nicotine dependence. Cessation provided.  ?  ?Estimated body mass index is 35.9 kg/m? as calculated from the following: ?  Height as of this encounter: 5' 10"  (1.778 m). ?  Weight as of this encounter: 113.5 kg. ?   ? ? ?Discharge Diagnoses:  ?Principal Problem: ?  Hyponatremia ?Active Problems: ?  Hypokalemia ?  Hypomagnesemia ?  Alcohol abuse ?  Essential hypertension ?  Mild intermittent asthma without complication ?  Prolonged QT interval ?  Nicotine dependence ? ? ? ?Discharge Instructions ? ?Discharge Instructions   ? ? Diet - low sodium heart healthy   Complete by: As directed ?  ? Increase activity slowly   Complete by: As directed ?  ? ?  ? ?Allergies as of 11/09/2021   ? ?   Reactions  ? Amoxicillin Other (See Comments)  ? unknown  ? ?  ? ?  ?Medication List  ?  ? ?STOP taking these medications   ? ?hydrochlorothiazide 25 MG tablet ?Commonly known as: HYDRODIURIL ?  ? ?  ? ?TAKE these medications   ? ?acetaminophen 500 MG tablet ?Commonly known as: TYLENOL ?Take 500 mg by mouth every 6 (six) hours as needed for moderate pain. ?  ?albuterol 108 (90 Base) MCG/ACT inhaler ?Commonly known as: VENTOLIN HFA ?Inhale 2 puffs into the lungs every 6 (six) hours as needed for wheezing or shortness of breath. ?  ?carvedilol 3.125 MG tablet ?Commonly known as: COREG ?Take 1 tablet (3.125 mg total) by mouth 2 (two) times daily with a meal. ?  ?fexofenadine 180 MG tablet ?Commonly known as: ALLEGRA ?Take 180 mg by mouth daily as needed for allergies or rhinitis. ?  ?fluticasone 50 MCG/ACT nasal spray ?Commonly known as: FLONASE ?Place 2 sprays into both nostrils daily. 2 sprays each nostril at night ?What changed:  ?when to take this ?additional instructions ?  ?folic acid 1 MG tablet ?Commonly known as: FOLVITE ?Take 1 tablet (1 mg  total) by mouth daily. ?Start taking on: November 10, 2021 ?  ?LUMIFY OP ?Place 1 drop into both eyes daily. ?  ?nicotine 21 mg/24hr patch ?Commonly known as: NICODERM CQ - dosed in mg/24 hours ?Place 1 patch (21 mg total) onto the skin daily. ?Start taking on: November 10, 2021 ?  ?potassium chloride SA 20 MEQ tablet ?Commonly known as: KLOR-CON M ?Take 2 tablets (40 mEq total) by mouth daily for 3  days. ?What changed:  ?how much to take ?when to take this ?  ?thiamine 100 MG tablet ?Take 1 tablet (100 mg total) by mouth daily. ?Start taking on: November 10, 2021 ?  ?traZODone 100 MG tablet ?Commonly known as: DESYREL ?Take 100 mg by mouth at bedtime as needed for sleep. ?  ? ?  ? ? ?Allergies  ?Allergen Reactions  ? Amoxicillin Other (See Comments)  ?  unknown  ? ? ?Consultations: ?None ? ? ?Procedures/Studies: ?No results found. ? ?Subjective: ?He is feeling better. Denies withdrawal symptoms  ? ?Discharge Exam: ?Vitals:  ? 11/08/21 2028 11/09/21 0450  ?BP: (!) 125/99 132/88  ?Pulse: (!) 107 (!) 105  ?Resp: 20 18  ?Temp: 99.2 ?F (37.3 ?C) 98.8 ?F (37.1 ?C)  ?SpO2: 90% 90%  ? ? ? ?General: Pt is alert, awake, not in acute distress ?Cardiovascular: RRR, S1/S2 +, no rubs, no gallops ?Respiratory: CTA bilaterally, no wheezing, no rhonchi ?Abdominal: Soft, NT, ND, bowel sounds + ?Extremities: no edema, no cyanosis ? ? ? ?The results of significant diagnostics from this hospitalization (including imaging, microbiology, ancillary and laboratory) are listed below for reference.   ? ? ?Microbiology: ?No results found for this or any previous visit (from the past 240 hour(s)).  ? ?Labs: ?BNP (last 3 results) ?No results for input(s): BNP in the last 8760 hours. ?Basic Metabolic Panel: ?Recent Labs  ?Lab 11/06/21 ?1553 11/07/21 ?0016 11/07/21 ?3335 11/07/21 ?4562 11/07/21 ?1400 11/07/21 ?2144 11/08/21 ?5638 11/08/21 ?1513 11/09/21 ?0434  ?NA 120* 125*  125*   < > 127* 129* 129*  --  132* 133*  ?K 3.0* 2.9*  2.9*   < > 3.4* 3.4* 3.5  --  3.5 3.3*  ?CL 68* 73*  73*   < > 75* 78* 80*  --  83* 86*  ?CO2 42* 40*  37*   < > 39* 39* 39*  --  38* 37*  ?GLUCOSE 109* 105*  104*   < > 136* 130* 102*  --  105* 106*  ?BUN <5* 6  6   < > <5* 5* 7  --  6 7  ?CREATININE 1.07 0.82  0.71   < > 0.79 1.00 0.86  --  0.98 0.72  ?CALCIUM 9.7 9.0  8.9   < > 8.9 8.9 8.9  --  9.2 8.8*  ?MG 1.6* 1.9  --   --   --   --  2.2  --   --   ? < >  = values in this interval not displayed.  ? ?Liver Function Tests: ?Recent Labs  ?Lab 11/06/21 ?1647  ?AST 57*  ?ALT 13  ?ALKPHOS 146*  ?BILITOT 0.6  ?PROT 6.9  ?ALBUMIN 3.5  ? ?No results for input(s): LIPASE, AMYLASE in the last 168 hours. ?No results for input(s): AMMONIA in the last 168 hours. ?CBC: ?Recent Labs  ?Lab 11/06/21 ?1553 11/07/21 ?9373 11/08/21 ?4287  ?WBC 8.1 8.0 8.5  ?NEUTROABS 6.3 5.9  --   ?HGB 10.6* 10.9* 11.3*  ?HCT 29.0* 30.2* 32.8*  ?  MCV 96.3 100.3* 106.1*  ?PLT 203 187 190  ? ?Cardiac Enzymes: ?No results for input(s): CKTOTAL, CKMB, CKMBINDEX, TROPONINI in the last 168 hours. ?BNP: ?Invalid input(s): POCBNP ?CBG: ?No results for input(s): GLUCAP in the last 168 hours. ?D-Dimer ?No results for input(s): DDIMER in the last 72 hours. ?Hgb A1c ?No results for input(s): HGBA1C in the last 72 hours. ?Lipid Profile ?No results for input(s): CHOL, HDL, LDLCALC, TRIG, CHOLHDL, LDLDIRECT in the last 72 hours. ?Thyroid function studies ?Recent Labs  ?  11/07/21 ?0016  ?TSH 3.430  ? ?Anemia work up ?No results for input(s): VITAMINB12, FOLATE, FERRITIN, TIBC, IRON, RETICCTPCT in the last 72 hours. ?Urinalysis ?   ?Component Value Date/Time  ? [place] YELLOW 11/06/2021 2054  ? APPEARANCEUR CLEAR 11/06/2021 2054  ? LABSPEC 1.008 11/06/2021 2054  ? PHURINE 7.5 11/06/2021 2054  ? [place] NEGATIVE 11/06/2021 2054  ? [place] NEGATIVE 11/06/2021 2054  ? [place] NEGATIVE 11/06/2021 2054  ? [place] NEGATIVE 11/06/2021 2054  ? [place] NEGATIVE 11/06/2021 2054  ? UROBILINOGEN 0.2 08/27/2014 2131  ? NITRITE NEGATIVE 11/06/2021 2054  ? LEUKOCYTESUR NEGATIVE 11/06/2021 2054  ? ?Sepsis Labs ?Invalid input(s): PROCALCITONIN,  WBC,  LACTICIDVEN ?Microbiology ?No results found for this or any previous visit (from the past 240 hour(s)). ? ? ?Time coordinating discharge: 40 minutes ? ?SIGNED: ? ? ?Elmarie Shiley, MD  ?[facility] ?  ?

## 2021-11-09 NOTE — Plan of Care (Signed)
?  Problem: Education: ?Goal: Knowledge of General Education information will improve ?Description: Including pain rating scale, medication(s)/side effects and non-pharmacologic comfort measures ?Outcome: Adequate for Discharge ?  ?Problem: Health Behavior/Discharge Planning: ?Goal: Ability to manage health-related needs will improve ?Outcome: Adequate for Discharge ?  ?Problem: Clinical Measurements: ?Goal: Ability to maintain clinical measurements within normal limits will improve ?Outcome: Adequate for Discharge ?Goal: Will remain free from infection ?Outcome: Adequate for Discharge ?Goal: Diagnostic test results will improve ?Outcome: Adequate for Discharge ?Goal: Respiratory complications will improve ?Outcome: Adequate for Discharge ?Goal: Cardiovascular complication will be avoided ?Outcome: Adequate for Discharge ?  ?Problem: Activity: ?Goal: Risk for activity intolerance will decrease ?Outcome: Adequate for Discharge ?  ?Problem: Nutrition: ?Goal: Adequate nutrition will be maintained ?Outcome: Adequate for Discharge ?  ?Problem: Coping: ?Goal: Level of anxiety will decrease ?Outcome: Adequate for Discharge ?  ?Problem: Elimination: ?Goal: Will not experience complications related to bowel motility ?Outcome: Adequate for Discharge ?Goal: Will not experience complications related to urinary retention ?Outcome: Adequate for Discharge ?  ?Problem: Pain Managment: ?Goal: General experience of comfort will improve ?Outcome: Adequate for Discharge ?  ?Problem: Safety: ?Goal: Ability to remain free from injury will improve ?Outcome: Adequate for Discharge ?  ?Problem: Skin Integrity: ?Goal: Risk for impaired skin integrity will decrease ?Outcome: Adequate for Discharge ? ? Pt dc home per MD order. ?  ?

## 2021-11-13 DIAGNOSIS — E7220 Disorder of urea cycle metabolism, unspecified: Secondary | ICD-10-CM | POA: Diagnosis not present

## 2021-11-13 DIAGNOSIS — Z0000 Encounter for general adult medical examination without abnormal findings: Secondary | ICD-10-CM | POA: Diagnosis not present

## 2021-11-13 DIAGNOSIS — D649 Anemia, unspecified: Secondary | ICD-10-CM | POA: Diagnosis not present

## 2021-11-13 DIAGNOSIS — E876 Hypokalemia: Secondary | ICD-10-CM | POA: Diagnosis not present

## 2021-11-13 DIAGNOSIS — E871 Hypo-osmolality and hyponatremia: Secondary | ICD-10-CM | POA: Diagnosis not present

## 2021-11-21 DIAGNOSIS — Z96641 Presence of right artificial hip joint: Secondary | ICD-10-CM | POA: Diagnosis not present

## 2021-12-03 DIAGNOSIS — R748 Abnormal levels of other serum enzymes: Secondary | ICD-10-CM | POA: Diagnosis not present

## 2021-12-03 DIAGNOSIS — K219 Gastro-esophageal reflux disease without esophagitis: Secondary | ICD-10-CM | POA: Diagnosis not present

## 2021-12-03 DIAGNOSIS — Z1211 Encounter for screening for malignant neoplasm of colon: Secondary | ICD-10-CM | POA: Diagnosis not present

## 2021-12-03 DIAGNOSIS — K5730 Diverticulosis of large intestine without perforation or abscess without bleeding: Secondary | ICD-10-CM | POA: Diagnosis not present

## 2021-12-05 DIAGNOSIS — N281 Cyst of kidney, acquired: Secondary | ICD-10-CM | POA: Diagnosis not present

## 2021-12-05 DIAGNOSIS — K760 Fatty (change of) liver, not elsewhere classified: Secondary | ICD-10-CM | POA: Diagnosis not present

## 2021-12-05 DIAGNOSIS — R160 Hepatomegaly, not elsewhere classified: Secondary | ICD-10-CM | POA: Diagnosis not present

## 2021-12-05 DIAGNOSIS — G4733 Obstructive sleep apnea (adult) (pediatric): Secondary | ICD-10-CM | POA: Diagnosis not present

## 2021-12-10 ENCOUNTER — Other Ambulatory Visit: Payer: Self-pay | Admitting: Gastroenterology

## 2021-12-10 DIAGNOSIS — R7989 Other specified abnormal findings of blood chemistry: Secondary | ICD-10-CM

## 2021-12-12 DIAGNOSIS — E785 Hyperlipidemia, unspecified: Secondary | ICD-10-CM | POA: Diagnosis not present

## 2021-12-12 DIAGNOSIS — R7303 Prediabetes: Secondary | ICD-10-CM | POA: Diagnosis not present

## 2021-12-12 DIAGNOSIS — E6609 Other obesity due to excess calories: Secondary | ICD-10-CM | POA: Diagnosis not present

## 2021-12-12 DIAGNOSIS — I10 Essential (primary) hypertension: Secondary | ICD-10-CM | POA: Diagnosis not present

## 2022-01-05 DIAGNOSIS — G4733 Obstructive sleep apnea (adult) (pediatric): Secondary | ICD-10-CM | POA: Diagnosis not present

## 2022-01-13 DIAGNOSIS — I10 Essential (primary) hypertension: Secondary | ICD-10-CM | POA: Diagnosis not present

## 2022-01-13 DIAGNOSIS — Z6837 Body mass index (BMI) 37.0-37.9, adult: Secondary | ICD-10-CM | POA: Diagnosis not present

## 2022-01-13 DIAGNOSIS — E876 Hypokalemia: Secondary | ICD-10-CM | POA: Diagnosis not present

## 2022-01-23 DIAGNOSIS — I10 Essential (primary) hypertension: Secondary | ICD-10-CM | POA: Diagnosis not present

## 2022-03-18 DIAGNOSIS — Z6836 Body mass index (BMI) 36.0-36.9, adult: Secondary | ICD-10-CM | POA: Diagnosis not present

## 2022-03-18 DIAGNOSIS — E6609 Other obesity due to excess calories: Secondary | ICD-10-CM | POA: Diagnosis not present

## 2022-08-19 DIAGNOSIS — M25562 Pain in left knee: Secondary | ICD-10-CM | POA: Diagnosis not present

## 2022-08-19 DIAGNOSIS — Z96641 Presence of right artificial hip joint: Secondary | ICD-10-CM | POA: Diagnosis not present

## 2022-08-19 DIAGNOSIS — M25552 Pain in left hip: Secondary | ICD-10-CM | POA: Diagnosis not present

## 2022-09-16 DIAGNOSIS — M25562 Pain in left knee: Secondary | ICD-10-CM | POA: Diagnosis not present

## 2022-09-25 DIAGNOSIS — M25552 Pain in left hip: Secondary | ICD-10-CM | POA: Diagnosis not present

## 2022-09-30 DIAGNOSIS — S83242A Other tear of medial meniscus, current injury, left knee, initial encounter: Secondary | ICD-10-CM | POA: Diagnosis not present

## 2022-10-08 DIAGNOSIS — M25562 Pain in left knee: Secondary | ICD-10-CM | POA: Diagnosis not present

## 2022-10-09 DIAGNOSIS — B370 Candidal stomatitis: Secondary | ICD-10-CM | POA: Diagnosis not present

## 2022-10-14 DIAGNOSIS — M25562 Pain in left knee: Secondary | ICD-10-CM | POA: Diagnosis not present

## 2022-10-28 DIAGNOSIS — S83242A Other tear of medial meniscus, current injury, left knee, initial encounter: Secondary | ICD-10-CM | POA: Diagnosis not present

## 2022-10-29 DIAGNOSIS — G4700 Insomnia, unspecified: Secondary | ICD-10-CM | POA: Diagnosis not present

## 2022-10-29 DIAGNOSIS — I10 Essential (primary) hypertension: Secondary | ICD-10-CM | POA: Diagnosis not present

## 2022-10-29 DIAGNOSIS — F1020 Alcohol dependence, uncomplicated: Secondary | ICD-10-CM | POA: Diagnosis not present

## 2022-10-29 DIAGNOSIS — F1310 Sedative, hypnotic or anxiolytic abuse, uncomplicated: Secondary | ICD-10-CM | POA: Diagnosis not present

## 2022-10-29 DIAGNOSIS — F329 Major depressive disorder, single episode, unspecified: Secondary | ICD-10-CM | POA: Diagnosis not present

## 2022-10-29 DIAGNOSIS — J45909 Unspecified asthma, uncomplicated: Secondary | ICD-10-CM | POA: Diagnosis not present

## 2022-10-29 DIAGNOSIS — R635 Abnormal weight gain: Secondary | ICD-10-CM | POA: Diagnosis not present

## 2022-10-29 DIAGNOSIS — F1210 Cannabis abuse, uncomplicated: Secondary | ICD-10-CM | POA: Diagnosis not present

## 2022-10-29 DIAGNOSIS — R7401 Elevation of levels of liver transaminase levels: Secondary | ICD-10-CM | POA: Diagnosis not present

## 2022-10-29 DIAGNOSIS — E876 Hypokalemia: Secondary | ICD-10-CM | POA: Diagnosis not present

## 2022-10-29 DIAGNOSIS — F429 Obsessive-compulsive disorder, unspecified: Secondary | ICD-10-CM | POA: Diagnosis not present

## 2022-10-29 DIAGNOSIS — J302 Other seasonal allergic rhinitis: Secondary | ICD-10-CM | POA: Diagnosis not present

## 2022-10-29 DIAGNOSIS — R12 Heartburn: Secondary | ICD-10-CM | POA: Diagnosis not present

## 2022-10-29 DIAGNOSIS — G4733 Obstructive sleep apnea (adult) (pediatric): Secondary | ICD-10-CM | POA: Diagnosis not present

## 2022-10-29 DIAGNOSIS — F17200 Nicotine dependence, unspecified, uncomplicated: Secondary | ICD-10-CM | POA: Diagnosis not present

## 2022-10-29 DIAGNOSIS — Z6837 Body mass index (BMI) 37.0-37.9, adult: Secondary | ICD-10-CM | POA: Diagnosis not present

## 2022-11-03 DIAGNOSIS — F329 Major depressive disorder, single episode, unspecified: Secondary | ICD-10-CM | POA: Diagnosis not present

## 2022-11-03 DIAGNOSIS — R7401 Elevation of levels of liver transaminase levels: Secondary | ICD-10-CM | POA: Diagnosis not present

## 2022-11-03 DIAGNOSIS — R635 Abnormal weight gain: Secondary | ICD-10-CM | POA: Diagnosis not present

## 2022-11-03 DIAGNOSIS — E876 Hypokalemia: Secondary | ICD-10-CM | POA: Diagnosis not present

## 2022-11-03 DIAGNOSIS — J45909 Unspecified asthma, uncomplicated: Secondary | ICD-10-CM | POA: Diagnosis not present

## 2022-11-03 DIAGNOSIS — G4700 Insomnia, unspecified: Secondary | ICD-10-CM | POA: Diagnosis not present

## 2022-11-03 DIAGNOSIS — I10 Essential (primary) hypertension: Secondary | ICD-10-CM | POA: Diagnosis not present

## 2022-11-03 DIAGNOSIS — J302 Other seasonal allergic rhinitis: Secondary | ICD-10-CM | POA: Diagnosis not present

## 2022-11-03 DIAGNOSIS — F1210 Cannabis abuse, uncomplicated: Secondary | ICD-10-CM | POA: Diagnosis not present

## 2022-11-03 DIAGNOSIS — F17200 Nicotine dependence, unspecified, uncomplicated: Secondary | ICD-10-CM | POA: Diagnosis not present

## 2022-11-03 DIAGNOSIS — F429 Obsessive-compulsive disorder, unspecified: Secondary | ICD-10-CM | POA: Diagnosis not present

## 2022-11-03 DIAGNOSIS — G4733 Obstructive sleep apnea (adult) (pediatric): Secondary | ICD-10-CM | POA: Diagnosis not present

## 2022-11-03 DIAGNOSIS — Z6837 Body mass index (BMI) 37.0-37.9, adult: Secondary | ICD-10-CM | POA: Diagnosis not present

## 2022-11-03 DIAGNOSIS — F1310 Sedative, hypnotic or anxiolytic abuse, uncomplicated: Secondary | ICD-10-CM | POA: Diagnosis not present

## 2022-11-03 DIAGNOSIS — F1020 Alcohol dependence, uncomplicated: Secondary | ICD-10-CM | POA: Diagnosis not present

## 2022-11-03 DIAGNOSIS — R12 Heartburn: Secondary | ICD-10-CM | POA: Diagnosis not present

## 2022-11-12 DIAGNOSIS — F429 Obsessive-compulsive disorder, unspecified: Secondary | ICD-10-CM | POA: Diagnosis not present

## 2022-11-12 DIAGNOSIS — G4700 Insomnia, unspecified: Secondary | ICD-10-CM | POA: Diagnosis not present

## 2022-11-12 DIAGNOSIS — J302 Other seasonal allergic rhinitis: Secondary | ICD-10-CM | POA: Diagnosis not present

## 2022-11-12 DIAGNOSIS — Z6837 Body mass index (BMI) 37.0-37.9, adult: Secondary | ICD-10-CM | POA: Diagnosis not present

## 2022-11-12 DIAGNOSIS — F17200 Nicotine dependence, unspecified, uncomplicated: Secondary | ICD-10-CM | POA: Diagnosis not present

## 2022-11-12 DIAGNOSIS — E876 Hypokalemia: Secondary | ICD-10-CM | POA: Diagnosis not present

## 2022-11-12 DIAGNOSIS — F329 Major depressive disorder, single episode, unspecified: Secondary | ICD-10-CM | POA: Diagnosis not present

## 2022-11-12 DIAGNOSIS — J45909 Unspecified asthma, uncomplicated: Secondary | ICD-10-CM | POA: Diagnosis not present

## 2022-11-12 DIAGNOSIS — G4733 Obstructive sleep apnea (adult) (pediatric): Secondary | ICD-10-CM | POA: Diagnosis not present

## 2022-11-12 DIAGNOSIS — R12 Heartburn: Secondary | ICD-10-CM | POA: Diagnosis not present

## 2022-11-12 DIAGNOSIS — F1020 Alcohol dependence, uncomplicated: Secondary | ICD-10-CM | POA: Diagnosis not present

## 2022-11-12 DIAGNOSIS — R635 Abnormal weight gain: Secondary | ICD-10-CM | POA: Diagnosis not present

## 2022-11-12 DIAGNOSIS — F1210 Cannabis abuse, uncomplicated: Secondary | ICD-10-CM | POA: Diagnosis not present

## 2022-11-12 DIAGNOSIS — F1310 Sedative, hypnotic or anxiolytic abuse, uncomplicated: Secondary | ICD-10-CM | POA: Diagnosis not present

## 2022-11-12 DIAGNOSIS — I10 Essential (primary) hypertension: Secondary | ICD-10-CM | POA: Diagnosis not present

## 2022-11-12 DIAGNOSIS — R7401 Elevation of levels of liver transaminase levels: Secondary | ICD-10-CM | POA: Diagnosis not present

## 2022-11-20 DIAGNOSIS — E876 Hypokalemia: Secondary | ICD-10-CM | POA: Diagnosis not present

## 2022-11-20 DIAGNOSIS — R635 Abnormal weight gain: Secondary | ICD-10-CM | POA: Diagnosis not present

## 2022-11-20 DIAGNOSIS — R7401 Elevation of levels of liver transaminase levels: Secondary | ICD-10-CM | POA: Diagnosis not present

## 2022-11-20 DIAGNOSIS — J45909 Unspecified asthma, uncomplicated: Secondary | ICD-10-CM | POA: Diagnosis not present

## 2022-11-20 DIAGNOSIS — G4733 Obstructive sleep apnea (adult) (pediatric): Secondary | ICD-10-CM | POA: Diagnosis not present

## 2022-11-20 DIAGNOSIS — F1210 Cannabis abuse, uncomplicated: Secondary | ICD-10-CM | POA: Diagnosis not present

## 2022-11-20 DIAGNOSIS — F429 Obsessive-compulsive disorder, unspecified: Secondary | ICD-10-CM | POA: Diagnosis not present

## 2022-11-20 DIAGNOSIS — F329 Major depressive disorder, single episode, unspecified: Secondary | ICD-10-CM | POA: Diagnosis not present

## 2022-11-20 DIAGNOSIS — F1310 Sedative, hypnotic or anxiolytic abuse, uncomplicated: Secondary | ICD-10-CM | POA: Diagnosis not present

## 2022-11-20 DIAGNOSIS — G4700 Insomnia, unspecified: Secondary | ICD-10-CM | POA: Diagnosis not present

## 2022-11-20 DIAGNOSIS — J302 Other seasonal allergic rhinitis: Secondary | ICD-10-CM | POA: Diagnosis not present

## 2022-11-20 DIAGNOSIS — I10 Essential (primary) hypertension: Secondary | ICD-10-CM | POA: Diagnosis not present

## 2022-11-20 DIAGNOSIS — Z6837 Body mass index (BMI) 37.0-37.9, adult: Secondary | ICD-10-CM | POA: Diagnosis not present

## 2022-11-20 DIAGNOSIS — F17200 Nicotine dependence, unspecified, uncomplicated: Secondary | ICD-10-CM | POA: Diagnosis not present

## 2022-11-20 DIAGNOSIS — R12 Heartburn: Secondary | ICD-10-CM | POA: Diagnosis not present

## 2022-11-20 DIAGNOSIS — F1020 Alcohol dependence, uncomplicated: Secondary | ICD-10-CM | POA: Diagnosis not present

## 2022-11-21 DIAGNOSIS — I10 Essential (primary) hypertension: Secondary | ICD-10-CM | POA: Diagnosis not present

## 2022-11-21 DIAGNOSIS — R12 Heartburn: Secondary | ICD-10-CM | POA: Diagnosis not present

## 2022-11-21 DIAGNOSIS — F1020 Alcohol dependence, uncomplicated: Secondary | ICD-10-CM | POA: Diagnosis not present

## 2022-11-21 DIAGNOSIS — F17200 Nicotine dependence, unspecified, uncomplicated: Secondary | ICD-10-CM | POA: Diagnosis not present

## 2022-11-21 DIAGNOSIS — F429 Obsessive-compulsive disorder, unspecified: Secondary | ICD-10-CM | POA: Diagnosis not present

## 2022-11-21 DIAGNOSIS — F329 Major depressive disorder, single episode, unspecified: Secondary | ICD-10-CM | POA: Diagnosis not present

## 2022-11-21 DIAGNOSIS — G4700 Insomnia, unspecified: Secondary | ICD-10-CM | POA: Diagnosis not present

## 2022-11-21 DIAGNOSIS — F1210 Cannabis abuse, uncomplicated: Secondary | ICD-10-CM | POA: Diagnosis not present

## 2022-11-21 DIAGNOSIS — R635 Abnormal weight gain: Secondary | ICD-10-CM | POA: Diagnosis not present

## 2022-11-21 DIAGNOSIS — J45909 Unspecified asthma, uncomplicated: Secondary | ICD-10-CM | POA: Diagnosis not present

## 2022-11-21 DIAGNOSIS — R7401 Elevation of levels of liver transaminase levels: Secondary | ICD-10-CM | POA: Diagnosis not present

## 2022-11-21 DIAGNOSIS — Z6837 Body mass index (BMI) 37.0-37.9, adult: Secondary | ICD-10-CM | POA: Diagnosis not present

## 2022-11-21 DIAGNOSIS — J302 Other seasonal allergic rhinitis: Secondary | ICD-10-CM | POA: Diagnosis not present

## 2022-11-21 DIAGNOSIS — F1310 Sedative, hypnotic or anxiolytic abuse, uncomplicated: Secondary | ICD-10-CM | POA: Diagnosis not present

## 2022-11-21 DIAGNOSIS — G4733 Obstructive sleep apnea (adult) (pediatric): Secondary | ICD-10-CM | POA: Diagnosis not present

## 2022-11-21 DIAGNOSIS — E876 Hypokalemia: Secondary | ICD-10-CM | POA: Diagnosis not present

## 2022-11-22 DIAGNOSIS — Z6837 Body mass index (BMI) 37.0-37.9, adult: Secondary | ICD-10-CM | POA: Diagnosis not present

## 2022-11-22 DIAGNOSIS — F1210 Cannabis abuse, uncomplicated: Secondary | ICD-10-CM | POA: Diagnosis not present

## 2022-11-22 DIAGNOSIS — F17200 Nicotine dependence, unspecified, uncomplicated: Secondary | ICD-10-CM | POA: Diagnosis not present

## 2022-11-22 DIAGNOSIS — G4733 Obstructive sleep apnea (adult) (pediatric): Secondary | ICD-10-CM | POA: Diagnosis not present

## 2022-11-22 DIAGNOSIS — R635 Abnormal weight gain: Secondary | ICD-10-CM | POA: Diagnosis not present

## 2022-11-22 DIAGNOSIS — R12 Heartburn: Secondary | ICD-10-CM | POA: Diagnosis not present

## 2022-11-22 DIAGNOSIS — I10 Essential (primary) hypertension: Secondary | ICD-10-CM | POA: Diagnosis not present

## 2022-11-22 DIAGNOSIS — R7401 Elevation of levels of liver transaminase levels: Secondary | ICD-10-CM | POA: Diagnosis not present

## 2022-11-22 DIAGNOSIS — F329 Major depressive disorder, single episode, unspecified: Secondary | ICD-10-CM | POA: Diagnosis not present

## 2022-11-22 DIAGNOSIS — E876 Hypokalemia: Secondary | ICD-10-CM | POA: Diagnosis not present

## 2022-11-22 DIAGNOSIS — F429 Obsessive-compulsive disorder, unspecified: Secondary | ICD-10-CM | POA: Diagnosis not present

## 2022-11-22 DIAGNOSIS — J45909 Unspecified asthma, uncomplicated: Secondary | ICD-10-CM | POA: Diagnosis not present

## 2022-11-22 DIAGNOSIS — F1020 Alcohol dependence, uncomplicated: Secondary | ICD-10-CM | POA: Diagnosis not present

## 2022-11-22 DIAGNOSIS — F1310 Sedative, hypnotic or anxiolytic abuse, uncomplicated: Secondary | ICD-10-CM | POA: Diagnosis not present

## 2022-11-22 DIAGNOSIS — J302 Other seasonal allergic rhinitis: Secondary | ICD-10-CM | POA: Diagnosis not present

## 2022-11-22 DIAGNOSIS — G4700 Insomnia, unspecified: Secondary | ICD-10-CM | POA: Diagnosis not present

## 2022-11-23 DIAGNOSIS — E876 Hypokalemia: Secondary | ICD-10-CM | POA: Diagnosis not present

## 2022-11-23 DIAGNOSIS — R12 Heartburn: Secondary | ICD-10-CM | POA: Diagnosis not present

## 2022-11-23 DIAGNOSIS — J45909 Unspecified asthma, uncomplicated: Secondary | ICD-10-CM | POA: Diagnosis not present

## 2022-11-23 DIAGNOSIS — F1310 Sedative, hypnotic or anxiolytic abuse, uncomplicated: Secondary | ICD-10-CM | POA: Diagnosis not present

## 2022-11-23 DIAGNOSIS — I10 Essential (primary) hypertension: Secondary | ICD-10-CM | POA: Diagnosis not present

## 2022-11-23 DIAGNOSIS — G4700 Insomnia, unspecified: Secondary | ICD-10-CM | POA: Diagnosis not present

## 2022-11-23 DIAGNOSIS — R635 Abnormal weight gain: Secondary | ICD-10-CM | POA: Diagnosis not present

## 2022-11-23 DIAGNOSIS — J302 Other seasonal allergic rhinitis: Secondary | ICD-10-CM | POA: Diagnosis not present

## 2022-11-23 DIAGNOSIS — G4733 Obstructive sleep apnea (adult) (pediatric): Secondary | ICD-10-CM | POA: Diagnosis not present

## 2022-11-23 DIAGNOSIS — F329 Major depressive disorder, single episode, unspecified: Secondary | ICD-10-CM | POA: Diagnosis not present

## 2022-11-23 DIAGNOSIS — F17200 Nicotine dependence, unspecified, uncomplicated: Secondary | ICD-10-CM | POA: Diagnosis not present

## 2022-11-23 DIAGNOSIS — F429 Obsessive-compulsive disorder, unspecified: Secondary | ICD-10-CM | POA: Diagnosis not present

## 2022-11-23 DIAGNOSIS — F1020 Alcohol dependence, uncomplicated: Secondary | ICD-10-CM | POA: Diagnosis not present

## 2022-11-23 DIAGNOSIS — R7401 Elevation of levels of liver transaminase levels: Secondary | ICD-10-CM | POA: Diagnosis not present

## 2022-11-23 DIAGNOSIS — F1210 Cannabis abuse, uncomplicated: Secondary | ICD-10-CM | POA: Diagnosis not present

## 2022-11-23 DIAGNOSIS — Z6837 Body mass index (BMI) 37.0-37.9, adult: Secondary | ICD-10-CM | POA: Diagnosis not present

## 2022-11-24 DIAGNOSIS — J45909 Unspecified asthma, uncomplicated: Secondary | ICD-10-CM | POA: Diagnosis not present

## 2022-11-24 DIAGNOSIS — Z6837 Body mass index (BMI) 37.0-37.9, adult: Secondary | ICD-10-CM | POA: Diagnosis not present

## 2022-11-24 DIAGNOSIS — R12 Heartburn: Secondary | ICD-10-CM | POA: Diagnosis not present

## 2022-11-24 DIAGNOSIS — F329 Major depressive disorder, single episode, unspecified: Secondary | ICD-10-CM | POA: Diagnosis not present

## 2022-11-24 DIAGNOSIS — I10 Essential (primary) hypertension: Secondary | ICD-10-CM | POA: Diagnosis not present

## 2022-11-24 DIAGNOSIS — F429 Obsessive-compulsive disorder, unspecified: Secondary | ICD-10-CM | POA: Diagnosis not present

## 2022-11-24 DIAGNOSIS — F17200 Nicotine dependence, unspecified, uncomplicated: Secondary | ICD-10-CM | POA: Diagnosis not present

## 2022-11-24 DIAGNOSIS — J302 Other seasonal allergic rhinitis: Secondary | ICD-10-CM | POA: Diagnosis not present

## 2022-11-24 DIAGNOSIS — F1310 Sedative, hypnotic or anxiolytic abuse, uncomplicated: Secondary | ICD-10-CM | POA: Diagnosis not present

## 2022-11-24 DIAGNOSIS — E876 Hypokalemia: Secondary | ICD-10-CM | POA: Diagnosis not present

## 2022-11-24 DIAGNOSIS — G4700 Insomnia, unspecified: Secondary | ICD-10-CM | POA: Diagnosis not present

## 2022-11-24 DIAGNOSIS — R635 Abnormal weight gain: Secondary | ICD-10-CM | POA: Diagnosis not present

## 2022-11-24 DIAGNOSIS — R7401 Elevation of levels of liver transaminase levels: Secondary | ICD-10-CM | POA: Diagnosis not present

## 2022-11-24 DIAGNOSIS — G4733 Obstructive sleep apnea (adult) (pediatric): Secondary | ICD-10-CM | POA: Diagnosis not present

## 2022-11-24 DIAGNOSIS — F1020 Alcohol dependence, uncomplicated: Secondary | ICD-10-CM | POA: Diagnosis not present

## 2022-11-24 DIAGNOSIS — F1210 Cannabis abuse, uncomplicated: Secondary | ICD-10-CM | POA: Diagnosis not present

## 2022-11-25 DIAGNOSIS — R12 Heartburn: Secondary | ICD-10-CM | POA: Diagnosis not present

## 2022-11-25 DIAGNOSIS — G4700 Insomnia, unspecified: Secondary | ICD-10-CM | POA: Diagnosis not present

## 2022-11-25 DIAGNOSIS — F1020 Alcohol dependence, uncomplicated: Secondary | ICD-10-CM | POA: Diagnosis not present

## 2022-11-25 DIAGNOSIS — R635 Abnormal weight gain: Secondary | ICD-10-CM | POA: Diagnosis not present

## 2022-11-25 DIAGNOSIS — J45909 Unspecified asthma, uncomplicated: Secondary | ICD-10-CM | POA: Diagnosis not present

## 2022-11-25 DIAGNOSIS — F17200 Nicotine dependence, unspecified, uncomplicated: Secondary | ICD-10-CM | POA: Diagnosis not present

## 2022-11-25 DIAGNOSIS — G4733 Obstructive sleep apnea (adult) (pediatric): Secondary | ICD-10-CM | POA: Diagnosis not present

## 2022-11-25 DIAGNOSIS — F1210 Cannabis abuse, uncomplicated: Secondary | ICD-10-CM | POA: Diagnosis not present

## 2022-11-25 DIAGNOSIS — I10 Essential (primary) hypertension: Secondary | ICD-10-CM | POA: Diagnosis not present

## 2022-11-25 DIAGNOSIS — F329 Major depressive disorder, single episode, unspecified: Secondary | ICD-10-CM | POA: Diagnosis not present

## 2022-11-25 DIAGNOSIS — Z6837 Body mass index (BMI) 37.0-37.9, adult: Secondary | ICD-10-CM | POA: Diagnosis not present

## 2022-11-25 DIAGNOSIS — F1310 Sedative, hypnotic or anxiolytic abuse, uncomplicated: Secondary | ICD-10-CM | POA: Diagnosis not present

## 2022-11-25 DIAGNOSIS — E876 Hypokalemia: Secondary | ICD-10-CM | POA: Diagnosis not present

## 2022-11-25 DIAGNOSIS — F429 Obsessive-compulsive disorder, unspecified: Secondary | ICD-10-CM | POA: Diagnosis not present

## 2022-11-25 DIAGNOSIS — J302 Other seasonal allergic rhinitis: Secondary | ICD-10-CM | POA: Diagnosis not present

## 2022-11-25 DIAGNOSIS — R7401 Elevation of levels of liver transaminase levels: Secondary | ICD-10-CM | POA: Diagnosis not present

## 2022-11-26 DIAGNOSIS — J302 Other seasonal allergic rhinitis: Secondary | ICD-10-CM | POA: Diagnosis not present

## 2022-11-26 DIAGNOSIS — F1310 Sedative, hypnotic or anxiolytic abuse, uncomplicated: Secondary | ICD-10-CM | POA: Diagnosis not present

## 2022-11-26 DIAGNOSIS — G4700 Insomnia, unspecified: Secondary | ICD-10-CM | POA: Diagnosis not present

## 2022-11-26 DIAGNOSIS — R12 Heartburn: Secondary | ICD-10-CM | POA: Diagnosis not present

## 2022-11-26 DIAGNOSIS — F1020 Alcohol dependence, uncomplicated: Secondary | ICD-10-CM | POA: Diagnosis not present

## 2022-11-26 DIAGNOSIS — F1210 Cannabis abuse, uncomplicated: Secondary | ICD-10-CM | POA: Diagnosis not present

## 2022-11-26 DIAGNOSIS — F329 Major depressive disorder, single episode, unspecified: Secondary | ICD-10-CM | POA: Diagnosis not present

## 2022-11-26 DIAGNOSIS — G4733 Obstructive sleep apnea (adult) (pediatric): Secondary | ICD-10-CM | POA: Diagnosis not present

## 2022-11-26 DIAGNOSIS — R635 Abnormal weight gain: Secondary | ICD-10-CM | POA: Diagnosis not present

## 2022-11-26 DIAGNOSIS — J45909 Unspecified asthma, uncomplicated: Secondary | ICD-10-CM | POA: Diagnosis not present

## 2022-11-26 DIAGNOSIS — R7401 Elevation of levels of liver transaminase levels: Secondary | ICD-10-CM | POA: Diagnosis not present

## 2022-11-26 DIAGNOSIS — I10 Essential (primary) hypertension: Secondary | ICD-10-CM | POA: Diagnosis not present

## 2022-11-26 DIAGNOSIS — F429 Obsessive-compulsive disorder, unspecified: Secondary | ICD-10-CM | POA: Diagnosis not present

## 2022-11-26 DIAGNOSIS — Z6837 Body mass index (BMI) 37.0-37.9, adult: Secondary | ICD-10-CM | POA: Diagnosis not present

## 2022-11-26 DIAGNOSIS — E876 Hypokalemia: Secondary | ICD-10-CM | POA: Diagnosis not present

## 2022-11-26 DIAGNOSIS — F17200 Nicotine dependence, unspecified, uncomplicated: Secondary | ICD-10-CM | POA: Diagnosis not present

## 2022-11-27 DIAGNOSIS — F17200 Nicotine dependence, unspecified, uncomplicated: Secondary | ICD-10-CM | POA: Diagnosis not present

## 2022-11-27 DIAGNOSIS — Z6837 Body mass index (BMI) 37.0-37.9, adult: Secondary | ICD-10-CM | POA: Diagnosis not present

## 2022-11-27 DIAGNOSIS — F1310 Sedative, hypnotic or anxiolytic abuse, uncomplicated: Secondary | ICD-10-CM | POA: Diagnosis not present

## 2022-11-27 DIAGNOSIS — F329 Major depressive disorder, single episode, unspecified: Secondary | ICD-10-CM | POA: Diagnosis not present

## 2022-11-27 DIAGNOSIS — F1020 Alcohol dependence, uncomplicated: Secondary | ICD-10-CM | POA: Diagnosis not present

## 2022-11-27 DIAGNOSIS — F429 Obsessive-compulsive disorder, unspecified: Secondary | ICD-10-CM | POA: Diagnosis not present

## 2022-11-27 DIAGNOSIS — R635 Abnormal weight gain: Secondary | ICD-10-CM | POA: Diagnosis not present

## 2022-11-27 DIAGNOSIS — E876 Hypokalemia: Secondary | ICD-10-CM | POA: Diagnosis not present

## 2022-11-27 DIAGNOSIS — G4733 Obstructive sleep apnea (adult) (pediatric): Secondary | ICD-10-CM | POA: Diagnosis not present

## 2022-11-27 DIAGNOSIS — J302 Other seasonal allergic rhinitis: Secondary | ICD-10-CM | POA: Diagnosis not present

## 2022-11-27 DIAGNOSIS — R12 Heartburn: Secondary | ICD-10-CM | POA: Diagnosis not present

## 2022-11-27 DIAGNOSIS — G4700 Insomnia, unspecified: Secondary | ICD-10-CM | POA: Diagnosis not present

## 2022-11-27 DIAGNOSIS — F1210 Cannabis abuse, uncomplicated: Secondary | ICD-10-CM | POA: Diagnosis not present

## 2022-11-27 DIAGNOSIS — R7401 Elevation of levels of liver transaminase levels: Secondary | ICD-10-CM | POA: Diagnosis not present

## 2022-11-27 DIAGNOSIS — I10 Essential (primary) hypertension: Secondary | ICD-10-CM | POA: Diagnosis not present

## 2022-11-27 DIAGNOSIS — J45909 Unspecified asthma, uncomplicated: Secondary | ICD-10-CM | POA: Diagnosis not present

## 2022-12-30 DIAGNOSIS — Z1211 Encounter for screening for malignant neoplasm of colon: Secondary | ICD-10-CM | POA: Diagnosis not present

## 2022-12-30 DIAGNOSIS — E669 Obesity, unspecified: Secondary | ICD-10-CM | POA: Diagnosis not present

## 2022-12-30 DIAGNOSIS — K5730 Diverticulosis of large intestine without perforation or abscess without bleeding: Secondary | ICD-10-CM | POA: Diagnosis not present

## 2022-12-30 DIAGNOSIS — Z86010 Personal history of colonic polyps: Secondary | ICD-10-CM | POA: Diagnosis not present

## 2023-01-06 DIAGNOSIS — F429 Obsessive-compulsive disorder, unspecified: Secondary | ICD-10-CM | POA: Diagnosis not present

## 2023-01-06 DIAGNOSIS — R7303 Prediabetes: Secondary | ICD-10-CM | POA: Diagnosis not present

## 2023-01-06 DIAGNOSIS — F1090 Alcohol use, unspecified, uncomplicated: Secondary | ICD-10-CM | POA: Diagnosis not present

## 2023-02-05 DIAGNOSIS — Z125 Encounter for screening for malignant neoplasm of prostate: Secondary | ICD-10-CM | POA: Diagnosis not present

## 2023-02-05 DIAGNOSIS — Z0000 Encounter for general adult medical examination without abnormal findings: Secondary | ICD-10-CM | POA: Diagnosis not present

## 2023-02-05 DIAGNOSIS — R7303 Prediabetes: Secondary | ICD-10-CM | POA: Diagnosis not present

## 2023-02-12 ENCOUNTER — Encounter: Payer: Self-pay | Admitting: Internal Medicine

## 2023-02-12 DIAGNOSIS — J45909 Unspecified asthma, uncomplicated: Secondary | ICD-10-CM | POA: Diagnosis not present

## 2023-02-12 DIAGNOSIS — K219 Gastro-esophageal reflux disease without esophagitis: Secondary | ICD-10-CM | POA: Diagnosis not present

## 2023-02-12 DIAGNOSIS — Z0000 Encounter for general adult medical examination without abnormal findings: Secondary | ICD-10-CM | POA: Diagnosis not present

## 2023-02-12 DIAGNOSIS — L719 Rosacea, unspecified: Secondary | ICD-10-CM | POA: Diagnosis not present

## 2023-02-12 DIAGNOSIS — Z23 Encounter for immunization: Secondary | ICD-10-CM | POA: Diagnosis not present

## 2023-02-13 ENCOUNTER — Other Ambulatory Visit: Payer: Self-pay | Admitting: Internal Medicine

## 2023-02-13 DIAGNOSIS — Z122 Encounter for screening for malignant neoplasm of respiratory organs: Secondary | ICD-10-CM

## 2023-02-18 DIAGNOSIS — K648 Other hemorrhoids: Secondary | ICD-10-CM | POA: Diagnosis not present

## 2023-02-18 DIAGNOSIS — I10 Essential (primary) hypertension: Secondary | ICD-10-CM | POA: Diagnosis not present

## 2023-02-18 DIAGNOSIS — Z1211 Encounter for screening for malignant neoplasm of colon: Secondary | ICD-10-CM | POA: Diagnosis not present

## 2023-02-18 DIAGNOSIS — K5730 Diverticulosis of large intestine without perforation or abscess without bleeding: Secondary | ICD-10-CM | POA: Diagnosis not present

## 2023-02-25 ENCOUNTER — Encounter: Payer: Self-pay | Admitting: Internal Medicine

## 2023-03-05 ENCOUNTER — Ambulatory Visit
Admission: RE | Admit: 2023-03-05 | Discharge: 2023-03-05 | Disposition: A | Discharge status: Home / Self Care | Payer: BC Managed Care – PPO | Source: Ambulatory Visit | Attending: Internal Medicine | Admitting: Internal Medicine

## 2023-03-05 DIAGNOSIS — F17210 Nicotine dependence, cigarettes, uncomplicated: Secondary | ICD-10-CM | POA: Diagnosis not present

## 2023-03-05 DIAGNOSIS — Z122 Encounter for screening for malignant neoplasm of respiratory organs: Secondary | ICD-10-CM

## 2023-04-08 DIAGNOSIS — I10 Essential (primary) hypertension: Secondary | ICD-10-CM | POA: Diagnosis not present

## 2023-04-08 DIAGNOSIS — R42 Dizziness and giddiness: Secondary | ICD-10-CM | POA: Diagnosis not present

## 2023-04-22 DIAGNOSIS — I10 Essential (primary) hypertension: Secondary | ICD-10-CM | POA: Diagnosis not present

## 2023-04-22 DIAGNOSIS — R42 Dizziness and giddiness: Secondary | ICD-10-CM | POA: Diagnosis not present

## 2023-05-12 ENCOUNTER — Encounter (HOSPITAL_BASED_OUTPATIENT_CLINIC_OR_DEPARTMENT_OTHER): Payer: Self-pay | Admitting: Emergency Medicine

## 2023-05-12 ENCOUNTER — Other Ambulatory Visit (HOSPITAL_BASED_OUTPATIENT_CLINIC_OR_DEPARTMENT_OTHER): Payer: Self-pay

## 2023-05-12 ENCOUNTER — Emergency Department (HOSPITAL_BASED_OUTPATIENT_CLINIC_OR_DEPARTMENT_OTHER)
Admission: EM | Admit: 2023-05-12 | Discharge: 2023-05-12 | Discharge status: Against Medical Advice | Payer: BC Managed Care – PPO | Attending: Emergency Medicine | Admitting: Emergency Medicine

## 2023-05-12 ENCOUNTER — Other Ambulatory Visit: Payer: Self-pay

## 2023-05-12 DIAGNOSIS — F1090 Alcohol use, unspecified, uncomplicated: Secondary | ICD-10-CM | POA: Insufficient documentation

## 2023-05-12 DIAGNOSIS — Z5321 Procedure and treatment not carried out due to patient leaving prior to being seen by health care provider: Secondary | ICD-10-CM | POA: Diagnosis not present

## 2023-05-12 DIAGNOSIS — Y907 Blood alcohol level of 200-239 mg/100 ml: Secondary | ICD-10-CM | POA: Diagnosis not present

## 2023-05-12 LAB — COMPREHENSIVE METABOLIC PANEL
ALT: 15 U/L (ref 0–44)
AST: 37 U/L (ref 15–41)
Albumin: 4.5 g/dL (ref 3.5–5.0)
Alkaline Phosphatase: 82 U/L (ref 38–126)
Anion gap: 11 (ref 5–15)
BUN: 5 mg/dL — ABNORMAL LOW (ref 6–20)
CO2: 31 mmol/L (ref 22–32)
Calcium: 9.5 mg/dL (ref 8.9–10.3)
Chloride: 95 mmol/L — ABNORMAL LOW (ref 98–111)
Creatinine, Ser: 0.82 mg/dL (ref 0.61–1.24)
GFR, Estimated: >60 mL/min (ref >60)
Glucose, Bld: 109 mg/dL — ABNORMAL HIGH (ref 70–99)
Potassium: 3.1 mmol/L — ABNORMAL LOW (ref 3.5–5.1)
Sodium: 137 mmol/L (ref 135–145)
Total Bilirubin: 0.8 mg/dL (ref 0.3–1.2)
Total Protein: 7.8 g/dL (ref 6.5–8.1)

## 2023-05-12 LAB — CBC
HCT: 49.5 % (ref 39.0–52.0)
Hemoglobin: 17.3 g/dL — ABNORMAL HIGH (ref 13.0–17.0)
MCH: 33.5 pg (ref 26.0–34.0)
MCHC: 34.9 g/dL (ref 30.0–36.0)
MCV: 95.9 fL (ref 80.0–100.0)
Platelets: 130 10*3/uL — ABNORMAL LOW (ref 150–400)
RBC: 5.16 MIL/uL (ref 4.22–5.81)
RDW: 16.4 % — ABNORMAL HIGH (ref 11.5–15.5)
WBC: 6 10*3/uL (ref 4.0–10.5)
nRBC: 0 % (ref 0.0–0.2)

## 2023-05-12 LAB — ETHANOL: Alcohol, Ethyl (B): 227 mg/dL — ABNORMAL HIGH (ref <10)

## 2023-05-12 NOTE — ED Triage Notes (Signed)
"  Alcohol" Looking to stop drinking. Last drink today around 11:30 Am. Normally drinks clubtails (10% alcohol) x 8 a day For 30+ years Has been to [facility] in past

## 2023-05-14 ENCOUNTER — Ambulatory Visit (HOSPITAL_COMMUNITY)
Admission: EM | Admit: 2023-05-14 | Discharge: 2023-05-14 | Disposition: A | Discharge status: Home / Self Care | Payer: BC Managed Care – PPO

## 2023-05-14 NOTE — ED Notes (Signed)
Patient left AMA.

## 2023-05-14 NOTE — Progress Notes (Signed)
   05/14/23 1442  [facility] Triage Screening (Walk-ins at [facility] only)  What Is the Reason for Your Visit/Call Today? Austin Brown is a 52 year old male presenting to [facility] seeking detox from ETOH and rehab services to follow. Pt reports drinking club tails that contain 10% ETOH equivalent to 16 beers a day. Pt also smokes THC daily for the past few weeks. Pt relapsed two months ago and was sober for about 3-4 months before relapse. 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 Pt reports withdrawal symptoms of anxiety, shaking, nausa, vomiting that occure 2 hours after drink. Denies hx of seizure and DT's. Reports treatment at [facility] twice in life time. Pt is hesitant to stay reports going to the ED and waited 3 hours so he left. Pt contemplating leaving now due to possible wait time.  How Long Has This Been Causing You Problems? > than 6 months  Have You Recently Had Any Thoughts About Hurting Yourself? No  Are You Planning to Commit Suicide/Harm Yourself At This time? No  Have you Recently Had Thoughts About Hurting Someone [name]? No  Are You Planning To Harm Someone At This Time? No  Are you currently experiencing any auditory, visual or other hallucinations? No  Have You Used Any Alcohol or Drugs in the Past 24 Hours? Yes  How long ago did you use Drugs or Alcohol? two hours ago 3 club tails 20 ounces a piece  What Did You Use and How Much? etoh  Do you have any current medical co-morbidities that require immediate attention? No  Clinician description of patient physical appearance/behavior: anxious, talking loud, intoxicated, some agitation and somewhat impatient  What Do You Feel Would Help You the Most Today? Alcohol or Drug Use Treatment  If access to [facility] was not available, would you have sought care in the Emergency Department? No  Determination of Need Urgent (48 hours)  Options For Referral Medication Management;Outpatient Therapy;Facility-Based  Crisis

## 2023-07-30 ENCOUNTER — Encounter (HOSPITAL_COMMUNITY): Payer: Self-pay | Admitting: *Deleted

## 2023-07-30 ENCOUNTER — Inpatient Hospital Stay (HOSPITAL_COMMUNITY)
Admission: EM | Admit: 2023-07-30 | Discharge: 2023-08-04 | DRG: 897 | Disposition: A | Discharge status: Home / Self Care | Payer: BC Managed Care – PPO | Attending: Internal Medicine | Admitting: Internal Medicine

## 2023-07-30 ENCOUNTER — Other Ambulatory Visit: Payer: Self-pay

## 2023-07-30 DIAGNOSIS — E871 Hypo-osmolality and hyponatremia: Secondary | ICD-10-CM | POA: Diagnosis not present

## 2023-07-30 DIAGNOSIS — Y906 Blood alcohol level of 120-199 mg/100 ml: Secondary | ICD-10-CM | POA: Diagnosis present

## 2023-07-30 DIAGNOSIS — F17200 Nicotine dependence, unspecified, uncomplicated: Secondary | ICD-10-CM | POA: Diagnosis present

## 2023-07-30 DIAGNOSIS — E878 Other disorders of electrolyte and fluid balance, not elsewhere classified: Secondary | ICD-10-CM | POA: Insufficient documentation

## 2023-07-30 DIAGNOSIS — R197 Diarrhea, unspecified: Secondary | ICD-10-CM | POA: Diagnosis not present

## 2023-07-30 DIAGNOSIS — E860 Dehydration: Secondary | ICD-10-CM | POA: Diagnosis present

## 2023-07-30 DIAGNOSIS — F32A Depression, unspecified: Secondary | ICD-10-CM | POA: Diagnosis not present

## 2023-07-30 DIAGNOSIS — R9431 Abnormal electrocardiogram [ECG] [EKG]: Secondary | ICD-10-CM | POA: Diagnosis present

## 2023-07-30 DIAGNOSIS — E8809 Other disorders of plasma-protein metabolism, not elsewhere classified: Secondary | ICD-10-CM | POA: Diagnosis not present

## 2023-07-30 DIAGNOSIS — D61818 Other pancytopenia: Secondary | ICD-10-CM | POA: Diagnosis not present

## 2023-07-30 DIAGNOSIS — I9589 Other hypotension: Secondary | ICD-10-CM | POA: Diagnosis not present

## 2023-07-30 DIAGNOSIS — F10939 Alcohol use, unspecified with withdrawal, unspecified: Secondary | ICD-10-CM | POA: Diagnosis present

## 2023-07-30 DIAGNOSIS — I10 Essential (primary) hypertension: Secondary | ICD-10-CM | POA: Diagnosis present

## 2023-07-30 DIAGNOSIS — F10239 Alcohol dependence with withdrawal, unspecified: Secondary | ICD-10-CM | POA: Diagnosis not present

## 2023-07-30 DIAGNOSIS — F10930 Alcohol use, unspecified with withdrawal, uncomplicated: Secondary | ICD-10-CM | POA: Diagnosis not present

## 2023-07-30 DIAGNOSIS — J4520 Mild intermittent asthma, uncomplicated: Secondary | ICD-10-CM | POA: Diagnosis not present

## 2023-07-30 DIAGNOSIS — F419 Anxiety disorder, unspecified: Secondary | ICD-10-CM | POA: Diagnosis present

## 2023-07-30 DIAGNOSIS — R7989 Other specified abnormal findings of blood chemistry: Secondary | ICD-10-CM | POA: Diagnosis not present

## 2023-07-30 DIAGNOSIS — Z6830 Body mass index (BMI) 30.0-30.9, adult: Secondary | ICD-10-CM

## 2023-07-30 DIAGNOSIS — D6959 Other secondary thrombocytopenia: Secondary | ICD-10-CM | POA: Diagnosis present

## 2023-07-30 DIAGNOSIS — E876 Hypokalemia: Secondary | ICD-10-CM | POA: Diagnosis present

## 2023-07-30 DIAGNOSIS — F10139 Alcohol abuse with withdrawal, unspecified: Principal | ICD-10-CM | POA: Diagnosis present

## 2023-07-30 DIAGNOSIS — K760 Fatty (change of) liver, not elsewhere classified: Secondary | ICD-10-CM | POA: Diagnosis present

## 2023-07-30 DIAGNOSIS — I959 Hypotension, unspecified: Secondary | ICD-10-CM | POA: Insufficient documentation

## 2023-07-30 DIAGNOSIS — Z792 Long term (current) use of antibiotics: Secondary | ICD-10-CM | POA: Diagnosis not present

## 2023-07-30 DIAGNOSIS — F17210 Nicotine dependence, cigarettes, uncomplicated: Secondary | ICD-10-CM | POA: Diagnosis not present

## 2023-07-30 DIAGNOSIS — E66811 Obesity, class 1: Secondary | ICD-10-CM | POA: Diagnosis present

## 2023-07-30 DIAGNOSIS — Z880 Allergy status to penicillin: Secondary | ICD-10-CM | POA: Diagnosis not present

## 2023-07-30 DIAGNOSIS — Z79899 Other long term (current) drug therapy: Secondary | ICD-10-CM

## 2023-07-30 DIAGNOSIS — Z7984 Long term (current) use of oral hypoglycemic drugs: Secondary | ICD-10-CM

## 2023-07-30 NOTE — ED Triage Notes (Signed)
 Pt friend at the bedside says pt has been on a drinking binge (for 3 weeks, drinks about 6-7, 24 ounce malt liquor). Has not had a drink in 2 days. Friend concerned that he had a seizure tonight. She said he stiffened up, and did not look right, for about 45 seconds and then he said I think I am having a seizure. 1 week of cough, dry mouth, hoarse voice. Pt seeking help with his ETOH problem. Denies SI or HI.

## 2023-07-31 ENCOUNTER — Encounter (HOSPITAL_COMMUNITY): Payer: Self-pay | Admitting: Internal Medicine

## 2023-07-31 DIAGNOSIS — F419 Anxiety disorder, unspecified: Secondary | ICD-10-CM | POA: Diagnosis present

## 2023-07-31 DIAGNOSIS — Z880 Allergy status to penicillin: Secondary | ICD-10-CM | POA: Diagnosis not present

## 2023-07-31 DIAGNOSIS — F10139 Alcohol abuse with withdrawal, unspecified: Secondary | ICD-10-CM | POA: Diagnosis present

## 2023-07-31 DIAGNOSIS — E66811 Obesity, class 1: Secondary | ICD-10-CM | POA: Diagnosis present

## 2023-07-31 DIAGNOSIS — K760 Fatty (change of) liver, not elsewhere classified: Secondary | ICD-10-CM | POA: Diagnosis present

## 2023-07-31 DIAGNOSIS — E871 Hypo-osmolality and hyponatremia: Secondary | ICD-10-CM | POA: Diagnosis present

## 2023-07-31 DIAGNOSIS — I959 Hypotension, unspecified: Secondary | ICD-10-CM | POA: Insufficient documentation

## 2023-07-31 DIAGNOSIS — D6959 Other secondary thrombocytopenia: Secondary | ICD-10-CM | POA: Diagnosis present

## 2023-07-31 DIAGNOSIS — E8809 Other disorders of plasma-protein metabolism, not elsewhere classified: Secondary | ICD-10-CM | POA: Diagnosis present

## 2023-07-31 DIAGNOSIS — F32A Depression, unspecified: Secondary | ICD-10-CM | POA: Diagnosis present

## 2023-07-31 DIAGNOSIS — I10 Essential (primary) hypertension: Secondary | ICD-10-CM | POA: Diagnosis present

## 2023-07-31 DIAGNOSIS — E876 Hypokalemia: Secondary | ICD-10-CM | POA: Diagnosis present

## 2023-07-31 DIAGNOSIS — J4520 Mild intermittent asthma, uncomplicated: Secondary | ICD-10-CM | POA: Diagnosis present

## 2023-07-31 DIAGNOSIS — F10930 Alcohol use, unspecified with withdrawal, uncomplicated: Secondary | ICD-10-CM | POA: Diagnosis not present

## 2023-07-31 DIAGNOSIS — R197 Diarrhea, unspecified: Secondary | ICD-10-CM | POA: Diagnosis present

## 2023-07-31 DIAGNOSIS — Y906 Blood alcohol level of 120-199 mg/100 ml: Secondary | ICD-10-CM | POA: Diagnosis present

## 2023-07-31 DIAGNOSIS — Z792 Long term (current) use of antibiotics: Secondary | ICD-10-CM | POA: Diagnosis not present

## 2023-07-31 DIAGNOSIS — F10939 Alcohol use, unspecified with withdrawal, unspecified: Secondary | ICD-10-CM | POA: Diagnosis present

## 2023-07-31 DIAGNOSIS — Z7984 Long term (current) use of oral hypoglycemic drugs: Secondary | ICD-10-CM | POA: Diagnosis not present

## 2023-07-31 DIAGNOSIS — E878 Other disorders of electrolyte and fluid balance, not elsewhere classified: Secondary | ICD-10-CM | POA: Insufficient documentation

## 2023-07-31 DIAGNOSIS — R9431 Abnormal electrocardiogram [ECG] [EKG]: Secondary | ICD-10-CM | POA: Diagnosis present

## 2023-07-31 DIAGNOSIS — Z79899 Other long term (current) drug therapy: Secondary | ICD-10-CM | POA: Diagnosis not present

## 2023-07-31 DIAGNOSIS — D61818 Other pancytopenia: Secondary | ICD-10-CM | POA: Diagnosis present

## 2023-07-31 DIAGNOSIS — E860 Dehydration: Secondary | ICD-10-CM | POA: Diagnosis present

## 2023-07-31 DIAGNOSIS — Z6830 Body mass index (BMI) 30.0-30.9, adult: Secondary | ICD-10-CM | POA: Diagnosis not present

## 2023-07-31 LAB — COMPREHENSIVE METABOLIC PANEL
ALT: 35 U/L (ref 0–44)
ALT: 41 U/L (ref 0–44)
AST: 89 U/L — ABNORMAL HIGH (ref 15–41)
AST: 94 U/L — ABNORMAL HIGH (ref 15–41)
Albumin: 3.5 g/dL (ref 3.5–5.0)
Albumin: 3.9 g/dL (ref 3.5–5.0)
Alkaline Phosphatase: 107 U/L (ref 38–126)
Alkaline Phosphatase: 92 U/L (ref 38–126)
Anion gap: 15 (ref 5–15)
Anion gap: 17 — ABNORMAL HIGH (ref 5–15)
BUN: 10 mg/dL (ref 6–20)
BUN: 10 mg/dL (ref 6–20)
CO2: 29 mmol/L (ref 22–32)
CO2: 32 mmol/L (ref 22–32)
Calcium: 8.5 mg/dL — ABNORMAL LOW (ref 8.9–10.3)
Calcium: 8.6 mg/dL — ABNORMAL LOW (ref 8.9–10.3)
Chloride: 82 mmol/L — ABNORMAL LOW (ref 98–111)
Chloride: 83 mmol/L — ABNORMAL LOW (ref 98–111)
Creatinine, Ser: 0.99 mg/dL (ref 0.61–1.24)
Creatinine, Ser: 0.99 mg/dL (ref 0.61–1.24)
GFR, Estimated: >60 mL/min (ref >60)
GFR, Estimated: >60 mL/min (ref >60)
Glucose, Bld: 82 mg/dL (ref 70–99)
Glucose, Bld: 99 mg/dL (ref 70–99)
Potassium: 2.5 mmol/L — CL (ref 3.5–5.1)
Potassium: 3.1 mmol/L — ABNORMAL LOW (ref 3.5–5.1)
Sodium: 128 mmol/L — ABNORMAL LOW (ref 135–145)
Sodium: 130 mmol/L — ABNORMAL LOW (ref 135–145)
Total Bilirubin: 2.2 mg/dL — ABNORMAL HIGH (ref 0.0–1.2)
Total Bilirubin: 2.4 mg/dL — ABNORMAL HIGH (ref 0.0–1.2)
Total Protein: 6.7 g/dL (ref 6.5–8.1)
Total Protein: 7.4 g/dL (ref 6.5–8.1)

## 2023-07-31 LAB — CBC
HCT: 36.3 % — ABNORMAL LOW (ref 39.0–52.0)
HCT: 41.7 % (ref 39.0–52.0)
Hemoglobin: 12.9 g/dL — ABNORMAL LOW (ref 13.0–17.0)
Hemoglobin: 15 g/dL (ref 13.0–17.0)
MCH: 35.6 pg — ABNORMAL HIGH (ref 26.0–34.0)
MCH: 35.9 pg — ABNORMAL HIGH (ref 26.0–34.0)
MCHC: 35.5 g/dL (ref 30.0–36.0)
MCHC: 36 g/dL (ref 30.0–36.0)
MCV: 100.3 fL — ABNORMAL HIGH (ref 80.0–100.0)
MCV: 99.8 fL (ref 80.0–100.0)
Platelets: 110 10*3/uL — ABNORMAL LOW (ref 150–400)
Platelets: 146 10*3/uL — ABNORMAL LOW (ref 150–400)
RBC: 3.62 MIL/uL — ABNORMAL LOW (ref 4.22–5.81)
RBC: 4.18 MIL/uL — ABNORMAL LOW (ref 4.22–5.81)
RDW: 13.3 % (ref 11.5–15.5)
RDW: 13.4 % (ref 11.5–15.5)
WBC: 4.5 10*3/uL (ref 4.0–10.5)
WBC: 6.7 10*3/uL (ref 4.0–10.5)
nRBC: 0 % (ref 0.0–0.2)
nRBC: 0.4 % — ABNORMAL HIGH (ref 0.0–0.2)

## 2023-07-31 LAB — CREATININE, SERUM
Creatinine, Ser: 0.87 mg/dL (ref 0.61–1.24)
GFR, Estimated: >60 mL/min (ref >60)

## 2023-07-31 LAB — RAPID URINE DRUG SCREEN, HOSP PERFORMED
Amphetamines: NOT DETECTED
Barbiturates: NOT DETECTED
Benzodiazepines: POSITIVE — AB
Cocaine: NOT DETECTED
Opiates: NOT DETECTED
Tetrahydrocannabinol: POSITIVE — AB

## 2023-07-31 LAB — PROTIME-INR
INR: 1.1 (ref 0.8–1.2)
Prothrombin Time: 14 s (ref 11.4–15.2)

## 2023-07-31 LAB — ETHANOL: Alcohol, Ethyl (B): 177 mg/dL — ABNORMAL HIGH (ref <10)

## 2023-07-31 LAB — HIV ANTIBODY (ROUTINE TESTING W REFLEX): HIV Screen 4th Generation wRfx: NONREACTIVE

## 2023-07-31 LAB — MAGNESIUM
Magnesium: 1.3 mg/dL — ABNORMAL LOW (ref 1.7–2.4)
Magnesium: 1.4 mg/dL — ABNORMAL LOW (ref 1.7–2.4)

## 2023-07-31 LAB — SODIUM, URINE, RANDOM: Sodium, Ur: <10 mmol/L

## 2023-07-31 LAB — PHOSPHORUS: Phosphorus: 1.9 mg/dL — ABNORMAL LOW (ref 2.5–4.6)

## 2023-07-31 MED ORDER — LORAZEPAM 1 MG PO TABS
1.0000 mg | ORAL_TABLET | ORAL | Status: AC | PRN
  Administered 2023-07-31: 1 mg via ORAL
  Administered 2023-08-01 (×4): 2 mg via ORAL
  Administered 2023-08-02 – 2023-08-03 (×4): 1 mg via ORAL
  Filled 2023-07-31: qty 2
  Filled 2023-07-31: qty 1
  Filled 2023-07-31 (×2): qty 2
  Filled 2023-07-31 (×3): qty 1
  Filled 2023-07-31: qty 2
  Filled 2023-07-31 (×2): qty 1

## 2023-07-31 MED ORDER — ADULT MULTIVITAMIN W/MINERALS CH
1.0000 | ORAL_TABLET | Freq: Every day | ORAL | Status: DC
  Administered 2023-07-31 – 2023-08-04 (×5): 1 via ORAL
  Filled 2023-07-31 (×5): qty 1

## 2023-07-31 MED ORDER — THIAMINE HCL 100 MG/ML IJ SOLN: 100.0000 mg | Freq: Every day | INTRAMUSCULAR | Status: DC

## 2023-07-31 MED ORDER — SODIUM CHLORIDE 0.9 % IV SOLN
INTRAVENOUS | Status: AC
Start: 2023-07-31 — End: 2023-08-01

## 2023-07-31 MED ORDER — THIAMINE HCL 100 MG/ML IJ SOLN
100.0000 mg | Freq: Every day | INTRAMUSCULAR | Status: DC
  Filled 2023-07-31: qty 2

## 2023-07-31 MED ORDER — LORAZEPAM 2 MG/ML IJ SOLN
0.0000 mg | Freq: Four times a day (QID) | INTRAMUSCULAR | Status: AC
  Administered 2023-07-31 – 2023-08-01 (×2): 1 mg via INTRAVENOUS
  Filled 2023-07-31: qty 1

## 2023-07-31 MED ORDER — LORAZEPAM 1 MG PO TABS
0.0000 mg | ORAL_TABLET | Freq: Four times a day (QID) | ORAL | Status: DC
Start: 2023-07-31 — End: 2023-07-31
  Administered 2023-07-31: 1 mg via ORAL
  Filled 2023-07-31: qty 1

## 2023-07-31 MED ORDER — LORAZEPAM 2 MG/ML IJ SOLN
0.0000 mg | Freq: Two times a day (BID) | INTRAMUSCULAR | Status: DC
Start: 2023-08-02 — End: 2023-07-31

## 2023-07-31 MED ORDER — FLUOXETINE HCL 20 MG PO CAPS
40.0000 mg | ORAL_CAPSULE | Freq: Every day | ORAL | Status: DC
Start: 2023-07-31 — End: 2023-08-04
  Administered 2023-07-31 – 2023-08-04 (×5): 40 mg via ORAL
  Filled 2023-07-31 (×5): qty 2

## 2023-07-31 MED ORDER — THIAMINE MONONITRATE 100 MG PO TABS: 100.0000 mg | ORAL_TABLET | Freq: Every day | ORAL | Status: DC

## 2023-07-31 MED ORDER — POTASSIUM CHLORIDE 10 MEQ/100ML IV SOLN
10.0000 meq | INTRAVENOUS | Status: DC
Start: 2023-07-31 — End: 2023-07-31
  Administered 2023-07-31 (×2): 10 meq via INTRAVENOUS
  Filled 2023-07-31 (×2): qty 100

## 2023-07-31 MED ORDER — LORAZEPAM 2 MG/ML IJ SOLN: 1.0000 mg | INTRAMUSCULAR | Status: AC | PRN

## 2023-07-31 MED ORDER — LORAZEPAM 1 MG PO TABS: 0.0000 mg | ORAL_TABLET | Freq: Two times a day (BID) | ORAL | Status: DC

## 2023-07-31 MED ORDER — LORAZEPAM 2 MG/ML IJ SOLN
0.0000 mg | Freq: Two times a day (BID) | INTRAMUSCULAR | Status: AC
  Administered 2023-08-03: 2 mg via INTRAVENOUS
  Filled 2023-07-31: qty 1

## 2023-07-31 MED ORDER — ENOXAPARIN SODIUM 40 MG/0.4ML IJ SOSY
40.0000 mg | PREFILLED_SYRINGE | INTRAMUSCULAR | Status: DC
  Administered 2023-07-31 – 2023-08-04 (×5): 40 mg via SUBCUTANEOUS
  Filled 2023-07-31 (×5): qty 0.4

## 2023-07-31 MED ORDER — LORAZEPAM 2 MG/ML IJ SOLN: 0.0000 mg | Freq: Four times a day (QID) | INTRAMUSCULAR | Status: DC

## 2023-07-31 MED ORDER — POTASSIUM CHLORIDE CRYS ER 20 MEQ PO TBCR
40.0000 meq | EXTENDED_RELEASE_TABLET | Freq: Once | ORAL | Status: AC
  Administered 2023-07-31: 40 meq via ORAL
  Filled 2023-07-31: qty 2

## 2023-07-31 MED ORDER — POTASSIUM CHLORIDE 10 MEQ/100ML IV SOLN
10.0000 meq | INTRAVENOUS | Status: AC
  Administered 2023-07-31 (×6): 10 meq via INTRAVENOUS
  Filled 2023-07-31 (×5): qty 100

## 2023-07-31 MED ORDER — MAGNESIUM SULFATE 2 GM/50ML IV SOLN
2.0000 g | Freq: Once | INTRAVENOUS | Status: AC
  Administered 2023-07-31: 2 g via INTRAVENOUS
  Filled 2023-07-31: qty 50

## 2023-07-31 MED ORDER — SODIUM CHLORIDE 0.9 % IV BOLUS
1000.0000 mL | Freq: Once | INTRAVENOUS | Status: AC
  Administered 2023-07-31: 1000 mL via INTRAVENOUS

## 2023-07-31 MED ORDER — GUAIFENESIN-DM 100-10 MG/5ML PO SYRP
5.0000 mL | ORAL_SOLUTION | ORAL | Status: DC | PRN
  Administered 2023-07-31 – 2023-08-02 (×3): 5 mL via ORAL
  Filled 2023-07-31 (×3): qty 10

## 2023-07-31 MED ORDER — FOLIC ACID 1 MG PO TABS
1.0000 mg | ORAL_TABLET | Freq: Every day | ORAL | Status: DC
  Administered 2023-07-31 – 2023-08-04 (×5): 1 mg via ORAL
  Filled 2023-07-31 (×5): qty 1

## 2023-07-31 MED ORDER — THIAMINE MONONITRATE 100 MG PO TABS
100.0000 mg | ORAL_TABLET | Freq: Every day | ORAL | Status: DC
  Administered 2023-08-01 – 2023-08-04 (×4): 100 mg via ORAL
  Filled 2023-07-31 (×4): qty 1

## 2023-07-31 MED ORDER — ALBUTEROL SULFATE (2.5 MG/3ML) 0.083% IN NEBU: 3.0000 mL | INHALATION_SOLUTION | Freq: Four times a day (QID) | RESPIRATORY_TRACT | Status: DC | PRN

## 2023-07-31 NOTE — ED Provider Notes (Signed)
 [facility]-EMER[facility]ENCY DEPT [facility] Emergency Department Provider Note MRN:  [id number]  Arrival date & time: 07/31/23     Chief Complaint   Alcohol Problem   History of Present Illness   Austin Brown is a 53 y.o. year-old male presents to the ED with chief complaint of alcohol problem.  He states that he is trying to stop drinking.  States that he had gone two days without drinking, became shaky and anxious and then drank some before coming in tonight.  He states that he hasn't been eating for the past couple of weeks and feels like his potassium is low.  He states that he thinks he had a seizure, but he describes this as zoning out briefly.  SO was present and no reports of full body shaking, he didn't lose consciousness.  He denies any hallucinations.  History provided by patient.   Review of Systems  Pertinent positive and negative review of systems noted in HPI.    Physical Exam   Vitals:   07/31/23 0218 07/31/23 0245  BP: 104/74 (!) 115/103  Pulse:  (!) 104  Resp:  19  Temp:    SpO2:  96%    CONSTITUTIONAL:  non toxic-appearing, NAD NEURO:  Alert and oriented x 3, CN 3-12 grossly intact EYES:  eyes equal and reactive ENT/NECK:  Supple, no stridor  CARDIO:  normal rate, regular rhythm, appears well-perfused  PULM:  No respiratory distress, CTAB GI/GU:  non-distended,  MSK/SPINE:  No gross deformities, no edema, moves all extremities  SKIN:  no rash, atraumatic   *Additional and/or pertinent findings included in MDM below  Diagnostic and Interventional Summary    EKG Interpretation Date/Time:  Friday July 31 2023 01:54:43 EST Ventricular Rate:  99 PR Interval:  164 QRS Duration:  105 QT Interval:  419 QTC Calculation: 538 R Axis:   65  Text Interpretation: Sinus rhythm Prolonged QT interval Confirmed by Midge Golas ([id number]) on 07/31/2023 1:59:26 AM       Labs Reviewed  COMPREHENSIVE METABOLIC PANEL - Abnormal; Notable for the following  components:      Result Value   Sodium 128 (*)    Potassium 2.5 (*)    Chloride 82 (*)    Calcium 8.6 (*)    AST 94 (*)    Total Bilirubin 2.2 (*)    Anion gap 17 (*)    All other components within normal limits  ETHANOL - Abnormal; Notable for the following components:   Alcohol, Ethyl (B) 177 (*)    All other components within normal limits  CBC - Abnormal; Notable for the following components:   RBC 4.18 (*)    MCH 35.9 (*)    Platelets 146 (*)    nRBC 0.4 (*)    All other components within normal limits  MAGNESIUM  - Abnormal; Notable for the following components:   Magnesium  1.3 (*)    All other components within normal limits  RAPID URINE DRUG SCREEN, HOSP PERFORMED    No orders to display    Medications  LORazepam  (ATIVAN ) injection 0-4 mg ( Intravenous See Alternative 07/31/23 0059)    Or  LORazepam  (ATIVAN ) tablet 0-4 mg (1 mg Oral Given 07/31/23 0059)  LORazepam  (ATIVAN ) injection 0-4 mg (has no administration in time range)    Or  LORazepam  (ATIVAN ) tablet 0-4 mg (has no administration in time range)  thiamine  (VITAMIN B1) tablet 100 mg (has no administration in time range)    Or  thiamine  (VITAMIN B1)  injection 100 mg (has no administration in time range)  potassium chloride  10 mEq in 100 mL IVPB (10 mEq Intravenous New Bag/Given 07/31/23 0345)  magnesium  sulfate IVPB 2 g 50 mL (2 g Intravenous New Bag/Given 07/31/23 0344)  sodium chloride  0.9 % bolus 1,000 mL (1,000 mLs Intravenous New Bag/Given 07/31/23 0248)  potassium chloride  SA (KLOR-CON  M) CR tablet 40 mEq (40 mEq Oral Given 07/31/23 0314)     Procedures  /  Critical Care .Critical Care  Performed by: Vicky Charleston, PA-C Authorized by: Vicky Charleston, PA-C   Critical care provider statement:    Critical care time (minutes):  42   Critical care was necessary to treat or prevent imminent or life-threatening deterioration of the following conditions:  Metabolic crisis   Critical care was time spent  personally by me on the following activities:  Development of treatment plan with patient or surrogate, discussions with consultants, evaluation of patient's response to treatment, examination of patient, ordering and review of laboratory studies, ordering and review of radiographic studies, ordering and performing treatments and interventions, pulse oximetry, re-evaluation of patient's condition and review of old charts   ED Course and Medical Decision Making  I have reviewed the triage vital signs, the nursing notes, and pertinent available records from the EMR.  Social Determinants Affecting Complexity of Care: Patient has no clinically significant social determinants affecting this chief complaint..   ED Course:    Medical Decision Making Patient here with alcohol withdrawal symptoms.  He states that he has been anxious and tremulous.  States that he had not had any alcohol for the past 2 days, but then became very anxious, but delirious, drink a few beers tonight.  He states that he felt a little bit better, but still feels anxious.  He is noted to be slightly tachycardic.  Screening labs are notable for profound hypokalemia of 2.5, I will add on a magnesium  level.  Magnesium  is 1.3.  Will begin repletion with IV potassium and magnesium .  He has noted to have a prolonged QTc.  Feel that patient would be best served by admission to the hospital for electrolyte repletion and treatment of acute alcohol withdrawal symptoms.  Amount and/or Complexity of Data Reviewed Labs: ordered. ECG/medicine tests: ordered.  Risk OTC drugs. Prescription drug management. Decision regarding hospitalization.         Consultants: I consulted with Hospitalist, Dr. Franky, who is appreciated for admitting.   Treatment and Plan: Patient's exam and diagnostic results are concerning for hypokalemia and hypomagnesemia with EKG changes and alcohol withdrawal symtpoms.  Feel that patient will need  admission to the hospital for further treatment and evaluation.    Final Clinical Impressions(s) / ED Diagnoses     ICD-10-CM   1. Alcohol withdrawal syndrome with complication (HCC)  F10.939     2. Hypokalemia  E87.6     3. Hypomagnesemia  E83.42       ED Discharge Orders     None         Discharge Instructions Discussed with and Provided to Patient:   Discharge Instructions   None      Vicky Charleston, PA-C 07/31/23 0400    Midge Golas, MD 07/31/23 [phone]

## 2023-07-31 NOTE — Progress Notes (Signed)
 PROGRESS NOTE    Austin Brown  FMW:[id number] DOB: 08/13/1970 DOA: 07/30/2023 PCP: Clarice Nottingham, MD   Brief Narrative:  The patient is a 53 year old Caucasian male with a past medical history significant for but limited to alcohol abuse, hypertension, asthma as well as other comorbidities who presents to the ED with symptoms concerning for alcohol withdrawal.  He states for last few days he did not drink alcohol with the intention of quitting alcohol but since then he started becoming tremulous and had 3 drinks before coming to the ED.  He has had on again off again diarrhea which is chronic but denied chest pain or shortness of breath or abdominal discomfort.  He denies any loss of consciousness weakness or any other issue going on and he states that he was recently seen by his PCP in last few months and states that his Coreg  has been increased.  In the ED his blood pressure was low normal and labs showed multiple electrolyte abnormalities including a sodium of 128 and a potassium of 2.5 and a magnesium  1.7.  EKG showed prolonged QTc of 538 ms.  He is given 2 L IV fluid bolus in the ED and given IV potassium and IV magnesium  and admitted for further management of his alcohol withdrawal.  Currently he is being admitted and treated for the following but not limited to:  Assessment and Plan:  Alcohol Abuse with Withdrawal -Will keep patient on CIWA protocol using IV Ativan  and IV thiamine .  Will closely monitor.  Hyponatremia -Has had poor oral intake and has had intermittent diarrhea; Na+ Trend: Recent Labs  Lab 07/30/23 2358  NA 128*  -C/w IVF Hydration -Checking Urine Sodium -Continue to Monitor and Trend and repeat CMP in the AM  Hypokalemia --Has had poor oral intake and has had intermittent diarrhea; Patient's K+ Level Trend: Recent Labs  Lab 07/30/23 2358  K 2.5*  -Replete with po Kcl 40 mEQ x2 and IV Kcl 60 mEQ -Continue to Monitor and Replete as Necessary -Repeat CMP in the  AM   Hypomagnesemia -Has had poor oral intake and has had intermittent diarrhea; Patient's Mag Level Trend: Recent Labs  Lab 07/31/23 0250  MG 1.3*  -Replete with IV Mag Sulfate 2 grams x2 -Continue to Monitor and Replete as Necessary -Repeat Mag in the AM   Low normal blood pressure -Likely from dehydration no definite signs of sepsis.  Received fluids -We will hold Coreg  and continue hydration as he is on NS at 100 mL/hr x 1 Day . History of Asthma -Takes as needed Albuterol  which has been changed to 3 mL IH q6hprn Wheezing and SOB  Depression and Anxiety -C/w Fluoxetine  40 mg po Daily   Polysubstance Abuse -UDS + for Benzodiazepines and THC -Counseling given   Abnormal/Elevated LFTs  Hyperbilirubinemia -Likely from alcohol related.   -AST/ALT and Bilirubin Trend: Recent Labs  Lab 07/30/23 2358  AST 94*  ALT 41  BILITOT 2.2*  -Will check INR to calculate hepatitis discriminant function score.  Check acute hepatitis panel. -Continue to Monitor and Trend Hepatic Fxn Panel and repeat CMP in the AM  Thrombocytopenia  -Likely from alcohol use. Appears to be chronic. -Platelet Count Trend: Recent Labs  Lab 07/30/23 2358 07/31/23 0511  PLT 146* 110*  -Continue to Monitor for S/Sx of Bleeding; No overt bleeding noted -Repeat CBC in the AM  Prolonged QTc  -Recheck EKG after correction of electrolytes.  Avoid QT prolonging medications. -Last qTC was 538 ms  DVT prophylaxis: enoxaparin  (LOVENOX ) injection 40 mg Start: 07/31/23 1000    Code Status: Full Code Family Communication: No family present at bedside   Disposition Plan:  Level of care: Progressive Status is: Inpatient Remains inpatient appropriate because: Need to ensure he is out of Alcohol withdrawal    Consultants:  None  Procedures:  As delineated as above   Antimicrobials:  Anti-infectives (From admission, onward)    None       Objective: Vitals:   07/31/23 0927 07/31/23 1109  07/31/23 1110 07/31/23 1333  BP:  (!) 147/85 (!) 147/85   Pulse:  88 85   Resp:  16    Temp: 97.8 F (36.6 C)   97.9 F (36.6 C)  TempSrc: Oral   Oral  SpO2:  100%      Intake/Output Summary (Last 24 hours) at 07/31/2023 1403 Last data filed at 07/31/2023 0446 Gross per 24 hour  Intake 1050 ml  Output --  Net 1050 ml   There were no vitals filed for this visit.  Data Reviewed: I have personally reviewed following labs and imaging studies  CBC: Recent Labs  Lab 07/30/23 2358 07/31/23 0511  WBC 6.7 4.5  HGB 15.0 12.9*  HCT 41.7 36.3*  MCV 99.8 100.3*  PLT 146* 110*   Basic Metabolic Panel: Recent Labs  Lab 07/30/23 2358 07/31/23 0250 07/31/23 0511  NA 128*  --   --   K 2.5*  --   --   CL 82*  --   --   CO2 29  --   --   GLUCOSE 99  --   --   BUN 10  --   --   CREATININE 0.99  --  0.87  CALCIUM 8.6*  --   --   MG  --  1.3*  --    GFR: CrCl cannot be calculated (Unknown ideal weight.). Liver Function Tests: Recent Labs  Lab 07/30/23 2358  AST 94*  ALT 41  ALKPHOS 107  BILITOT 2.2*  PROT 7.4  ALBUMIN 3.9   No results for input(s): LIPASE, AMYLASE in the last 168 hours. No results for input(s): AMMONIA in the last 168 hours. Coagulation Profile: Recent Labs  Lab 07/31/23 0653  INR 1.1   Cardiac Enzymes: No results for input(s): CKTOTAL, CKMB, CKMBINDEX, TROPONINI in the last 168 hours. BNP (last 3 results) No results for input(s): PROBNP in the last 8760 hours. HbA1C: No results for input(s): HGBA1C in the last 72 hours. CBG: No results for input(s): GLUCAP in the last 168 hours. Lipid Profile: No results for input(s): CHOL, HDL, LDLCALC, TRIG, CHOLHDL, LDLDIRECT in the last 72 hours. Thyroid  Function Tests: No results for input(s): TSH, T4TOTAL, FREET4, T3FREE, THYROIDAB in the last 72 hours. Anemia Panel: No results for input(s): VITAMINB12, FOLATE, FERRITIN, TIBC, IRON, RETICCTPCT in  the last 72 hours. Sepsis Labs: No results for input(s): PROCALCITON, LATICACIDVEN in the last 168 hours.  No results found for this or any previous visit (from the past 240 hours).   Radiology Studies: No results found.  Scheduled Meds:  enoxaparin  (LOVENOX ) injection  40 mg Subcutaneous Q24H   FLUoxetine   40 mg Oral Daily   folic acid   1 mg Oral Daily   LORazepam   0-4 mg Intravenous Q6H   Followed by   NOREEN ON 08/02/2023] LORazepam   0-4 mg Intravenous Q12H   multivitamin with minerals  1 tablet Oral Daily   potassium chloride   40 mEq Oral Once   thiamine   100 mg Oral Daily   Or   thiamine   100 mg Intravenous Daily   Continuous Infusions:  sodium chloride  100 mL/hr at 07/31/23 0447   magnesium  sulfate bolus IVPB      LOS: 0 days   Alejandro Marker, DO [facility] Available via Epic secure chat 7am-7pm After these hours, please refer to coverage provider listed on [email or web address] 07/31/2023, 2:03 PM

## 2023-07-31 NOTE — ED Notes (Signed)
 ED TO INPATIENT HANDOFF REPORT  ED Nurse Name and Phone #: Michiel Sivley  S Name/Age/Gender Austin Brown 53 y.o. male Room/Bed: WA18/WA18  Code Status   Code Status: Full Code  Home/SNF/Other Home Patient oriented to: self, place, time, and situation Is this baseline? Yes   Triage Complete: Triage complete  Chief Complaint Alcohol withdrawal (HCC) [F10.939]  Triage Note Pt friend at the bedside says pt has been on a drinking binge (for 3 weeks, drinks about 6-7, 24 ounce malt liquor). Has not had a drink in 2 days. Friend concerned that he had a seizure tonight. She said he stiffened up, and did not look right, for about 45 seconds and then he said I think I am having a seizure. 1 week of cough, dry mouth, hoarse voice. Pt seeking help with his ETOH problem. Denies SI or HI.    Allergies Allergies  Allergen Reactions   Amoxicillin Other (See Comments)    unknown    Level of Care/Admitting Diagnosis ED Disposition     ED Disposition  Admit   Condition  --   Comment  Hospital Area: [facility] [[id number]]  Level of Care: Progressive [102]  Admit to Progressive based on following criteria: COMPLICATED UROLOGY Patients requiring frequent assessments and interventions, such as continuous bladder irrigations, immediate post-op surgical procedures, i.e., bladder removal/ileal conduit.  May admit patient to [facility] or [facility] if equivalent level of care is available:: Yes  Covid Evaluation: Asymptomatic - no recent exposure (last 10 days) testing not required  Diagnosis: Alcohol withdrawal (HCC) [291.81.ICD-9-CM]  Admitting Physician: KAKRAKANDY, ARSHAD N [[id number]]  Attending Physician: FRANKY REDIA SAILOR [id number]  Certification:: I certify this patient will need inpatient services for at least 2 midnights          B Medical/Surgery History Past Medical History:  Diagnosis Date   Asthma    Hypertension    Past Surgical History:  Procedure  Laterality Date   JOINT REPLACEMENT       A IV Location/Drains/Wounds Patient Lines/Drains/Airways Status     Active Line/Drains/Airways     Name Placement date Placement time Site Days   Peripheral IV 07/31/23 20 G Left;Posterior Hand 07/31/23  0243  Hand  less than 1   Wound / Incision (Open or Dehisced) 01/29/14 Laceration Arm Right;Left;Lateral multiple laceration on bilateral arms, face and chest 01/29/14  0135  Arm  3470            Intake/Output Last 24 hours  Intake/Output Summary (Last 24 hours) at 07/31/2023 1244 Last data filed at 07/31/2023 0446 Gross per 24 hour  Intake 1050 ml  Output --  Net 1050 ml    Labs/Imaging Results for orders placed or performed during the hospital encounter of 07/30/23 (from the past 48 hours)  Comprehensive metabolic panel     Status: Abnormal   Collection Time: 07/30/23 11:58 PM  Result Value Ref Range   Sodium 128 (L) 135 - 145 mmol/L   Potassium 2.5 (LL) 3.5 - 5.1 mmol/L    Comment: CRITICAL RESULT CALLED TO, READ BACK BY AND VERIFIED WITH gibson, k. rn at 0053 on 1.10.25. FA    Chloride 82 (L) 98 - 111 mmol/L   CO2 29 22 - 32 mmol/L   Glucose, Bld 99 70 - 99 mg/dL    Comment: Glucose reference range applies only to samples taken after fasting for at least 8 hours.   BUN 10 6 - 20 mg/dL   Creatinine, Ser  0.99 0.61 - 1.24 mg/dL   Calcium 8.6 (L) 8.9 - 10.3 mg/dL   Total Protein 7.4 6.5 - 8.1 g/dL   Albumin 3.9 3.5 - 5.0 g/dL   AST 94 (H) 15 - 41 U/L   ALT 41 0 - 44 U/L   Alkaline Phosphatase 107 38 - 126 U/L   Total Bilirubin 2.2 (H) 0.0 - 1.2 mg/dL   GFR, Estimated >39 >39 mL/min    Comment: (NOTE) Calculated using the CKD-EPI Creatinine Equation (2021)    Anion gap 17 (H) 5 - 15    Comment: Performed at [facility], [address], [place], [place] [address]  Ethanol     Status: Abnormal   Collection Time: 07/30/23 11:58 PM  Result Value Ref Range   Alcohol, Ethyl (B) 177 (H) <10 mg/dL     Comment: (NOTE) Lowest detectable limit for serum alcohol is 10 mg/dL.  For medical purposes only. Performed at [facility], [address], [place], [place] [address]   cbc     Status: Abnormal   Collection Time: 07/30/23 11:58 PM  Result Value Ref Range   WBC 6.7 4.0 - 10.5 K/uL   RBC 4.18 (L) 4.22 - 5.81 MIL/uL   Hemoglobin 15.0 13.0 - 17.0 g/dL   HCT 58.2 60.9 - 47.9 %   MCV 99.8 80.0 - 100.0 fL   MCH 35.9 (H) 26.0 - 34.0 pg   MCHC 36.0 30.0 - 36.0 g/dL   RDW 86.6 88.4 - 84.4 %   Platelets 146 (L) 150 - 400 K/uL    Comment: REPEATED TO VERIFY   nRBC 0.4 (H) 0.0 - 0.2 %    Comment: Performed at [facility], [address] [place], [place] [address]  Magnesium      Status: Abnormal   Collection Time: 07/31/23  2:50 AM  Result Value Ref Range   Magnesium  1.3 (L) 1.7 - 2.4 mg/dL    Comment: Performed at [facility], [address], [place], [place] [address]  CBC     Status: Abnormal   Collection Time: 07/31/23  5:11 AM  Result Value Ref Range   WBC 4.5 4.0 - 10.5 K/uL   RBC 3.62 (L) 4.22 - 5.81 MIL/uL   Hemoglobin 12.9 (L) 13.0 - 17.0 g/dL   HCT 63.6 (L) 60.9 - 47.9 %   MCV 100.3 (H) 80.0 - 100.0 fL   MCH 35.6 (H) 26.0 - 34.0 pg   MCHC 35.5 30.0 - 36.0 g/dL   RDW 86.5 88.4 - 84.4 %   Platelets 110 (L) 150 - 400 K/uL   nRBC 0.0 0.0 - 0.2 %    Comment: Performed at [facility], [address], [place], [place] [address]  Creatinine, serum     Status: None   Collection Time: 07/31/23  5:11 AM  Result Value Ref Range   Creatinine, Ser 0.87 0.61 - 1.24 mg/dL   GFR, Estimated >39 >39 mL/min    Comment: (NOTE) Calculated using the CKD-EPI Creatinine Equation (2021) Performed at [facility], [address] [place], [place] [address]   Rapid urine drug screen (hospital performed)     Status: Abnormal   Collection Time: 07/31/23  5:47 AM  Result Value Ref Range   Opiates  NONE DETECTED NONE DETECTED   Cocaine NONE DETECTED NONE DETECTED   Benzodiazepines POSITIVE (A) NONE DETECTED   Amphetamines NONE DETECTED NONE DETECTED   Tetrahydrocannabinol POSITIVE (A) NONE  DETECTED   Barbiturates NONE DETECTED NONE DETECTED    Comment: (NOTE) DRUG SCREEN FOR MEDICAL PURPOSES ONLY.  IF CONFIRMATION IS NEEDED FOR ANY PURPOSE, NOTIFY LAB WITHIN 5 DAYS.  LOWEST DETECTABLE LIMITS FOR URINE DRUG SCREEN Drug Class                     Cutoff (ng/mL) Amphetamine and metabolites    1000 Barbiturate and metabolites    200 Benzodiazepine                 200 Opiates and metabolites        300 Cocaine and metabolites        300 THC                            50 Performed at [facility], [address], [place], [place] [address]   Sodium, urine, random     Status: None   Collection Time: 07/31/23  5:47 AM  Result Value Ref Range   Sodium, Ur <10 mmol/L    Comment: Performed at [facility], [address] [place], [place] [address]  Protime-INR     Status: None   Collection Time: 07/31/23  6:53 AM  Result Value Ref Range   Prothrombin Time 14.0 11.4 - 15.2 seconds   INR 1.1 0.8 - 1.2    Comment: (NOTE) INR goal varies based on device and disease states. Performed at [facility], [address] [place], [place] [address]    No results found.  Pending Labs Unresulted Labs (From admission, onward)     Start     Ordered   08/07/23 0500  Creatinine, serum  (enoxaparin  (LOVENOX )    CrCl >/= 30 ml/min)  Weekly,   R     Comments: while on enoxaparin  therapy    07/31/23 0435   07/31/23 0920  Comprehensive metabolic panel  Once,   R        07/31/23 0919   07/31/23 0920  Magnesium   Once,   R        07/31/23 0919   07/31/23 0920  Phosphorus  Once,   R        07/31/23 0919   07/31/23 0433  HIV Antibody (routine testing w rflx)  (HIV Antibody (Routine testing w reflex) panel)  Once,   R        07/31/23  0435            Vitals/Pain Today's Vitals   07/31/23 0752 07/31/23 0927 07/31/23 1109 07/31/23 1110  BP: 129/81  (!) 147/85 (!) 147/85  Pulse: 81  88 85  Resp: 14  16   Temp:  97.8 F (36.6 C)    TempSrc:  Oral    SpO2: 94%  100%   PainSc:        Isolation Precautions No active isolations  Medications Medications  FLUoxetine  (PROZAC ) capsule 40 mg (40 mg Oral Given 07/31/23 1100)  albuterol  (PROVENTIL ) (2.5 MG/3ML) 0.083% nebulizer solution 3 mL (has no administration in time range)  enoxaparin  (LOVENOX ) injection 40 mg (40 mg Subcutaneous Given 07/31/23 1100)  LORazepam  (ATIVAN ) tablet 1-4 mg (has no administration in time range)    Or  LORazepam  (ATIVAN ) injection 1-4 mg (has no administration in time range)  thiamine  (VITAMIN B1) tablet 100 mg (has no administration in time range)    Or  thiamine  (VITAMIN B1) injection 100  mg (has no administration in time range)  folic acid  (FOLVITE ) tablet 1 mg (1 mg Oral Given 07/31/23 1101)  multivitamin with minerals tablet 1 tablet (1 tablet Oral Given 07/31/23 1101)  LORazepam  (ATIVAN ) injection 0-4 mg ( Intravenous Not Given 07/31/23 1155)    Followed by  LORazepam  (ATIVAN ) injection 0-4 mg (has no administration in time range)  0.9 %  sodium chloride  infusion ( Intravenous New Bag/Given 07/31/23 0447)  sodium chloride  0.9 % bolus 1,000 mL (0 mLs Intravenous Stopped 07/31/23 0446)  potassium chloride  SA (KLOR-CON  M) CR tablet 40 mEq (40 mEq Oral Given 07/31/23 0314)  magnesium  sulfate IVPB 2 g 50 mL (0 g Intravenous Stopped 07/31/23 0446)  potassium chloride  10 mEq in 100 mL IVPB (10 mEq Intravenous New Bag/Given 07/31/23 1100)    Mobility walks     Focused Assessments ETOH withdrawal/ hypokalemia   R Recommendations: See Admitting Provider Note  Report given to:   Additional Notes:

## 2023-07-31 NOTE — H&P (Signed)
 History and Physical    Austin Brown FMW:[id number] DOB: 1971/01/06 DOA: 07/30/2023  Patient coming from: Home.  Chief Complaint: Alcohol withdrawal.  HPI: Austin Brown is a 53 y.o. male with history of alcohol abuse, hypertension, asthma presents to the ER with symptoms concerning for alcohol withdrawal.  Patient states for the last 2 days he did not drink alcohol with the intention of quitting alcohol but since he be started becoming tremulous he had 3 drinks before coming to the ER.  Also has off-and-on diarrhea which is chronic.  Denies any chest pain shortness of breath abdominal pain loss of consciousness or weakness of extremities.  Patient states 2 months ago his primary care physician increase his Coreg  dose from 3.125-6.25.  ED Course: In the ER patient blood pressure was in the low normal.  Labs show multiple electrolyte abnormalities with sodium 128 potassium 2.5 magnesium  1.7 anion gap 17 alcohol level 177 per EKG shows normal sinus rhythm with QTc of 538 ms.  Patient was given 2 L fluid bolus in the ER and p.o. and IV potassium and IV magnesium  replacement ordered.  Patient admitted for further management.  Review of Systems: As per HPI, rest all negative.   Past Medical History:  Diagnosis Date   Asthma    Hypertension     Past Surgical History:  Procedure Laterality Date   JOINT REPLACEMENT       reports that he has been smoking cigarettes. He uses smokeless tobacco. He reports current alcohol use of about 8.0 standard drinks of alcohol per week. He reports that he does not use drugs.  Allergies  Allergen Reactions   Amoxicillin Other (See Comments)    unknown    Family History  Problem Relation Age of Onset   Heart disease Neg Hx     Prior to Admission medications   Medication Sig Start Date End Date Taking? Authorizing Provider  acetaminophen  (TYLENOL ) 500 MG tablet Take 500 mg by mouth every 6 (six) hours as needed for moderate pain.   Yes [provider]  albuterol  (VENTOLIN  HFA) 108 (90 Base) MCG/ACT inhaler Inhale 2 puffs into the lungs every 6 (six) hours as needed for wheezing or shortness of breath. 05/31/21  Yes [provider]  azithromycin (ZITHROMAX) 250 MG tablet Take 250 mg by mouth as directed. 05/07/23  Yes [provider]  carvedilol  (COREG ) 6.25 MG tablet Take 6.25 mg by mouth 2 (two) times daily. 07/27/23  Yes [provider]  famotidine (PEPCID) 20 MG tablet Take 20 mg by mouth daily. 02/12/23  Yes [provider]  fexofenadine (ALLEGRA) 180 MG tablet Take 180 mg by mouth daily as needed for allergies or rhinitis.   Yes [provider]  FLUoxetine  (PROZAC ) 40 MG capsule Take 40 mg by mouth daily. 01/06/23  Yes [provider]  metFORMIN (GLUCOPHAGE) 500 MG tablet Take 500 mg by mouth daily. 03/31/23  Yes [provider]  naltrexone (DEPADE) 50 MG tablet Take 50 mg by mouth daily. 01/07/23  Yes [provider]  Potassium Chloride  ER 20 MEQ TBCR Take 1 tablet by mouth daily. 03/31/23  Yes [provider]  thiamine  100 MG tablet Take 1 tablet (100 mg total) by mouth daily. 11/10/21  Yes Regalado, Belkys A, MD  traZODone  (DESYREL ) 100 MG tablet Take 100 mg by mouth at bedtime as needed for sleep. 10/14/21  Yes [provider]  Brimonidine  Tartrate (LUMIFY  OP) Place 1 drop into both eyes daily.  [provider]  carvedilol  (COREG ) 3.125 MG tablet Take 1 tablet (3.125 mg total) by mouth 2 (two) times daily with a meal. Patient not taking: Reported on 07/31/2023 11/09/21   Regalado, Owen A, MD  fluticasone  (FLONASE ) 50 MCG/ACT nasal spray Place 2 sprays into both nostrils daily. 2 sprays each nostril at night Patient taking differently: Place 2 sprays into both nostrils at bedtime. 03/06/21   Ethyl Lonni BRAVO, MD  folic acid  (FOLVITE ) 1 MG tablet Take 1 tablet (1 mg total) by mouth daily. Patient not taking: Reported on 07/31/2023  11/10/21   Regalado, Belkys A, MD  nicotine  (NICODERM CQ  - DOSED IN MG/24 HOURS) 21 mg/24hr patch Place 1 patch (21 mg total) onto the skin daily. Patient not taking: Reported on 07/31/2023 11/10/21   Regalado, Belkys A, MD  potassium chloride  SA (KLOR-CON  M) 20 MEQ tablet Take 2 tablets (40 mEq total) by mouth daily for 3 days. 11/09/21 11/12/21  Regalado, Owen LABOR, MD    Physical Exam: Constitutional: Moderately built and nourished. Vitals:   07/31/23 0129 07/31/23 0156 07/31/23 0218 07/31/23 0245  BP: (!) 94/59 94/65 104/74 (!) 115/103  Pulse: (!) 102 98  (!) 104  Resp: 18   19  Temp: (!) 97.5 F (36.4 C)     TempSrc: Oral     SpO2: 93%   96%   Eyes: Anicteric no pallor. ENMT: No discharge from the ears or nose or mouth. Neck: No mass felt.  No neck rigidity. Respiratory: No rhonchi reputations. Cardiovascular: S1-S2 heard. Abdomen: Soft nontender bowel sound present. Musculoskeletal: No edema. Skin: No rash. Neurologic: Alert awake oriented to time place and person.  Moves all extremities. Psychiatric: Normal.  Normal affect.   Labs on Admission: I have personally reviewed following labs and imaging studies  CBC: Recent Labs  Lab 07/30/23 2358  WBC 6.7  HGB 15.0  HCT 41.7  MCV 99.8  PLT 146*   Basic Metabolic Panel: Recent Labs  Lab 07/30/23 2358 07/31/23 0250  NA 128*  --   K 2.5*  --   CL 82*  --   CO2 29  --   GLUCOSE 99  --   BUN 10  --   CREATININE 0.99  --   CALCIUM 8.6*  --   MG  --  1.3*   GFR: CrCl cannot be calculated (Unknown ideal weight.). Liver Function Tests: Recent Labs  Lab 07/30/23 2358  AST 94*  ALT 41  ALKPHOS 107  BILITOT 2.2*  PROT 7.4  ALBUMIN 3.9   No results for input(s): LIPASE, AMYLASE in the last 168 hours. No results for input(s): AMMONIA in the last 168 hours. Coagulation Profile: No results for input(s): INR, PROTIME in the last 168 hours. Cardiac Enzymes: No results for input(s): CKTOTAL, CKMB,  CKMBINDEX, TROPONINI in the last 168 hours. BNP (last 3 results) No results for input(s): PROBNP in the last 8760 hours. HbA1C: No results for input(s): HGBA1C in the last 72 hours. CBG: No results for input(s): GLUCAP in the last 168 hours. Lipid Profile: No results for input(s): CHOL, HDL, LDLCALC, TRIG, CHOLHDL, LDLDIRECT in the last 72 hours. Thyroid  Function Tests: No results for input(s): TSH, T4TOTAL, FREET4, T3FREE, THYROIDAB in the last 72 hours. Anemia Panel: No results for input(s): VITAMINB12, FOLATE, FERRITIN, TIBC, IRON, RETICCTPCT in the last 72 hours. Urine analysis:    Component Value Date/Time   COLORURINE YELLOW 11/06/2021 2054   APPEARANCEUR CLEAR 11/06/2021 2054   LABSPEC 1.008 11/06/2021 2054  PHURINE 7.5 11/06/2021 2054   GLUCOSEU NEGATIVE 11/06/2021 2054   HGBUR NEGATIVE 11/06/2021 2054   BILIRUBINUR NEGATIVE 11/06/2021 2054   KETONESUR NEGATIVE 11/06/2021 2054   PROTEINUR NEGATIVE 11/06/2021 2054   UROBILINOGEN 0.2 08/27/2014 2131   NITRITE NEGATIVE 11/06/2021 2054   LEUKOCYTESUR NEGATIVE 11/06/2021 2054   Sepsis Labs: @LABRCNTIP (procalcitonin:4,lacticidven:4) )No results found for this or any previous visit (from the past 240 hours).   Radiological Exams on Admission: No results found.  EKG: Independently reviewed.  Sinus rhythm prolonged QTc of 538 ms.  Assessment/Plan Principal Problem:   Alcohol withdrawal (HCC) Active Problems:   Hypokalemia   Hypomagnesemia   Essential hypertension   Mild intermittent asthma without complication   Prolonged QT interval   Nicotine  dependence    Alcohol abuse with withdrawal -     will keep patient on CIWA protocol using IV Ativan  and IV thiamine .  Will closely monitor. Multiple electrolyte abnormalities including hyponatremia, hypokalemia, hypomagnesemia likely from poor oral intake and also has off-and-on diarrhea.  Patient did receive fluids in the ER we  will check urine sodium.  IV and oral potassium replacement and IV magnesium  replacement has been ordered.  Following which will closely monitor electrolytes to see if any further replacements are required. Low normal blood pressure likely from dehydration no definite signs of sepsis.  Received fluids we will hold Coreg  and continue hydration. History of asthma takes as needed albuterol . History of depression on fluoxetine . Elevated LFTs likely from alcohol related.  Will check INR to calculate hepatitis discriminant function score.  Check acute hepatitis panel. Thrombocytopenia likely from alcohol use.  Follow CBC.  Appears to be chronic. Prolonged QTc recheck EKG after correction of electrolytes.  Avoid QT prolonging medications.  Given that patient is presenting with alcohol withdrawal with multiple electrolyte abnormalities and prolonged QTc will need close monitoring and inpatient status.   DVT prophylaxis: Lovenox .  (PT/INR is pending.). Code Status: Full code. Family Communication: Discussed with patient. Disposition Plan: Progressive care. Consults called: Child psychotherapist. Admission status: Inpatient.

## 2023-07-31 NOTE — Plan of Care (Signed)
  Problem: Education: Goal: Knowledge of General Education information will improve Description: Including pain rating scale, medication(s)/side effects and non-pharmacologic comfort measures Outcome: Progressing   Problem: Health Behavior/Discharge Planning: Goal: Ability to manage health-related needs will improve Outcome: Progressing   Problem: Clinical Measurements: Goal: Ability to maintain clinical measurements within normal limits will improve Outcome: Progressing Goal: Diagnostic test results will improve Outcome: Progressing   Problem: Nutrition: Goal: Adequate nutrition will be maintained Outcome: Progressing   Problem: Coping: Goal: Level of anxiety will decrease Outcome: Progressing   Problem: Pain Management: Goal: General experience of comfort will improve Outcome: Progressing   Problem: Clinical Measurements: Goal: Will remain free from infection Outcome: Adequate for Discharge Goal: Respiratory complications will improve Outcome: Adequate for Discharge Goal: Cardiovascular complication will be avoided Outcome: Adequate for Discharge   Problem: Activity: Goal: Risk for activity intolerance will decrease Outcome: Adequate for Discharge   Problem: Elimination: Goal: Will not experience complications related to bowel motility Outcome: Adequate for Discharge Goal: Will not experience complications related to urinary retention Outcome: Adequate for Discharge   Problem: Safety: Goal: Ability to remain free from injury will improve Outcome: Adequate for Discharge   Problem: Skin Integrity: Goal: Risk for impaired skin integrity will decrease Outcome: Adequate for Discharge

## 2023-07-31 NOTE — ED Provider Triage Note (Signed)
 Emergency Medicine Provider Triage Evaluation Note  Austin Brown , a 53 y.o. male  was evaluated in triage.  Pt complains of ETOH withdrawal.  Last drink was about 3 hours ago.  Hadn't had anything for 2 days prior.  Felt tremulous and like he was going to have a seizure.  Review of Systems  Positive: anxious Negative: Nausea, A/V hallucinations  Physical Exam  BP 99/64 (BP Location: Left Arm)   Pulse (!) 101   Temp 98.4 F (36.9 C) (Oral)   Resp 18   SpO2 99%  Gen:   Awake, no distress   Resp:  Normal effort  MSK:   Moves extremities without difficulty  Other:    Medical Decision Making  Medically screening exam initiated at 12:47 AM.  Appropriate orders placed.  Austin Brown was informed that the remainder of the evaluation will be completed by another provider, this initial triage assessment does not replace that evaluation, and the importance of remaining in the ED until their evaluation is complete.     Vicky Charleston, PA-C 07/31/23 [phone]

## 2023-07-31 NOTE — Hospital Course (Addendum)
 The patient is a 53 year old Caucasian male with a past medical history significant for but limited to alcohol abuse, hypertension, asthma as well as other comorbidities who presents to the ED with symptoms concerning for alcohol withdrawal.  He states for last few days he did not drink alcohol with the intention of quitting alcohol but since then he started becoming tremulous and had 3 drinks before coming to the ED.  He has had on again off again diarrhea which is chronic but denied chest pain or shortness of breath or abdominal discomfort.  He denies any loss of consciousness weakness or any other issue going on and he states that he was recently seen by his PCP in last few months and states that his Coreg  has been increased.  In the ED his blood pressure was low normal and labs showed multiple electrolyte abnormalities including a sodium of 128 and a potassium of 2.5 and a magnesium  1.7.  EKG showed prolonged QTc of 538 ms.  He is given 2 L IV fluid bolus in the ED and given IV potassium and IV magnesium  and admitted for further management of his alcohol withdrawal. Slowly improving but continues to be Tremulous. Getting closer to being Discharged and was planned to be discharged today but LFTs went up so we will work this up with a right upper quadrant ultrasound which showed hepatic steatosis.  LFTs are still mildly elevated but relatively stable and likely can be discharged and follow-up these in outpatient setting within 1 week.  Assessment and Plan:  Alcohol Abuse with Withdrawal, improving -Will keep patient on CIWA protocol using po/IV Lorazepam  0-4 mg q6h for 48 hours followed by q12h for 48 hours and then 1 mg po/IV q1hprn Withdrawal Symptoms -Last Drink was Wednesday evening  -C/w Folic Acid  1 mg po Daily, MVI + Minerals 1 tab po Daily, and Thiamine  100 mg po/IV daily  -CIWA scores are slowly improving but will need to continue monitor and ensure that his CIWA scores remain under 5 consistently  prior to discharging; CIWA Scores ranging from 4-5  Hyponatremia, improved  -Has had poor oral intake and has had intermittent diarrhea; Na+ Trend: Recent Labs  Lab 07/30/23 2358 07/31/23 1050 08/01/23 0457 08/02/23 0433 08/03/23 0404 08/04/23 0358  NA 128* 130* 137 136 135 131*  -IVF Hydration now stopped  -Checking Urine Sodium but never sent  -Continue to Monitor and Trend and repeat CMP in the AM  Hypokalemia -Has had poor oral intake and has had intermittent diarrhea; Patient's K+ Level Trend: Recent Labs  Lab 07/30/23 2358 07/31/23 1050 08/01/23 0457 08/02/23 0433 08/03/23 0404 08/04/23 0358  K 2.5* 3.1* 3.2* 3.5 3.5 3.8  -Replete with p.o. KCl 40 mEq BID x2 -Continue to Monitor and Replete as Necessary -Repeat CMP within 1 week  Hypophosphatemia -Phos Level Trend: Recent Labs  Lab 07/31/23 1050 08/01/23 0457 08/02/23 0433 08/03/23 0404 08/04/23 0358  PHOS 1.9* 1.7* 2.8 2.8 3.6  -Continue to monitor and replete as necessary -Repeat Phos Level within 1 week   Hypomagnesemia -Has had poor oral intake and has had intermittent diarrhea; Patient's Mag Level Trend: Recent Labs  Lab 07/31/23 0250 07/31/23 1050 08/01/23 0457 08/02/23 0433 08/03/23 0404 08/04/23 0358  MG 1.3* 1.4* 2.0 1.8 2.0 2.0  -Continue to Monitor and Replete as Necessary -Repeat Mag in the AM   Hyponatremia -Na+ Trend: Recent Labs  Lab 07/30/23 2358 07/31/23 1050 08/01/23 0457 08/02/23 0433 08/03/23 0404 08/04/23 0358  NA 128* 130* 137  136 135 131*  -Continue to Monitor and Trend in the outpatient setting within 1 week  Hypertension -Initially had low normal BP Likely from dehydration no definite signs of sepsis.  Received fluids -Held Coreg  was given IV fluid hydration which is now finished -Carvedilol  6.25 mg po BID has now be resumed  -Blood pressure is now better and increasing and will need to continue monitor blood pressures per protocol -Last blood pressure reading  was 112/90 . History of Asthma -Takes as needed Albuterol  which has been changed to 3 mL IH q6hprn Wheezing and SOB  Depression and Anxiety -C/w Fluoxetine  40 mg po Daily   Polysubstance Abuse -UDS + for Benzodiazepines and THC -Counseling given   Abnormal/Elevated LFTs  Hyperbilirubinemia, slightly worsened from yesterday -Likely from alcohol related.   -AST/ALT and Bilirubin Trend: Recent Labs  Lab 07/30/23 2358 07/31/23 1050 08/01/23 0457 08/02/23 0433 08/03/23 0404 08/04/23 0358  AST 94* 89* 74* 97* 121* 131*  ALT 41 35 31 38 50* 64*  BILITOT 2.2* 2.4* 1.9* 0.9 1.3* 1.1  -Checked PT-INR to calculate hepatitis discriminant function score and was 11.6 and indicated a Good Prognosis  -Check Acute Hepatitis Panel and Negative -Given Elevation today will obtain RUQ U/S which showed Hepatic steatosis. Please note limited evaluation for focal hepatic masses in a patient with hepatic steatosis due to decreased penetration of the acoustic ultrasound waves. -Continue to Monitor and Trend Hepatic Fxn Panel and repeat CMP within 1 week  Pancytopenia -CBC Trend: Recent Labs  Lab 07/30/23 2358 07/31/23 0511 08/01/23 0457 08/02/23 0921 08/03/23 0404 08/04/23 0358  WBC 6.7 4.5 3.7* 5.3 7.3 7.5  HGB 15.0 12.9* 12.3* 12.8* 12.9* 12.8*  HCT 41.7 36.3* 35.9* 36.3* 36.2* 37.9*  MCV 99.8 100.3* 102.3* 102.8* 102.3* 105.0*  PLT 146* 110* 100* 137* 158 163  -Checked Anemia Panel showed an iron level of 60, UIBC 244, TIBC 304, saturation ratios of 20%, ferritin level 1489, folate level 8.4 and a vitamin B12 766 -Continue to monitor for signs of bleeding; no overt bleeding noted Repeat CBC within 1 week  Thrombocytopenia  -Likely from alcohol use. Appears to be chronic but is trending down. -Platelet Count Trend: Recent Labs  Lab 07/30/23 2358 07/31/23 0511 08/01/23 0457 08/02/23 0921 08/03/23 0404 08/04/23 0358  PLT 146* 110* 100* 137* 158 163  -Continue to Monitor for  S/Sx of Bleeding; No overt bleeding noted -Repeat CBC within 1 week   Prolonged QTc  -Recheck EKG after correction of electrolytes.  Avoid QT prolonging medications. -Initial qTC was 538 ms and repeat was 456  Hypoalbuminemia -Patient's Albumin Trend: Recent Labs  Lab 07/30/23 2358 07/31/23 1050 08/01/23 0457 08/02/23 0433 08/03/23 0404 08/04/23 0358  ALBUMIN 3.9 3.5 3.1* 3.3* 3.7 3.4*  -Continue to Monitor and Trend and repeat CMP within 1 week  Class I Obesity -Complicates overall prognosis and care -Estimated body mass index is 30.94 kg/m as calculated from the following:   Height as of this encounter: 5' 10 (1.778 m).   Weight as of this encounter: 97.8 kg.  -Weight Loss and Dietary Counseling given

## 2023-08-01 DIAGNOSIS — E878 Other disorders of electrolyte and fluid balance, not elsewhere classified: Secondary | ICD-10-CM

## 2023-08-01 DIAGNOSIS — E871 Hypo-osmolality and hyponatremia: Secondary | ICD-10-CM | POA: Diagnosis not present

## 2023-08-01 DIAGNOSIS — F17210 Nicotine dependence, cigarettes, uncomplicated: Secondary | ICD-10-CM

## 2023-08-01 DIAGNOSIS — F10930 Alcohol use, unspecified with withdrawal, uncomplicated: Secondary | ICD-10-CM

## 2023-08-01 DIAGNOSIS — R9431 Abnormal electrocardiogram [ECG] [EKG]: Secondary | ICD-10-CM

## 2023-08-01 DIAGNOSIS — I9589 Other hypotension: Secondary | ICD-10-CM

## 2023-08-01 DIAGNOSIS — J4520 Mild intermittent asthma, uncomplicated: Secondary | ICD-10-CM

## 2023-08-01 DIAGNOSIS — I10 Essential (primary) hypertension: Secondary | ICD-10-CM

## 2023-08-01 DIAGNOSIS — E876 Hypokalemia: Secondary | ICD-10-CM | POA: Diagnosis not present

## 2023-08-01 LAB — COMPREHENSIVE METABOLIC PANEL
ALT: 31 U/L (ref 0–44)
AST: 74 U/L — ABNORMAL HIGH (ref 15–41)
Albumin: 3.1 g/dL — ABNORMAL LOW (ref 3.5–5.0)
Alkaline Phosphatase: 89 U/L (ref 38–126)
Anion gap: 9 (ref 5–15)
BUN: 8 mg/dL (ref 6–20)
CO2: 30 mmol/L (ref 22–32)
Calcium: 8.5 mg/dL — ABNORMAL LOW (ref 8.9–10.3)
Chloride: 98 mmol/L (ref 98–111)
Creatinine, Ser: 0.66 mg/dL (ref 0.61–1.24)
GFR, Estimated: >60 mL/min (ref >60)
Glucose, Bld: 93 mg/dL (ref 70–99)
Potassium: 3.2 mmol/L — ABNORMAL LOW (ref 3.5–5.1)
Sodium: 137 mmol/L (ref 135–145)
Total Bilirubin: 1.9 mg/dL — ABNORMAL HIGH (ref 0.0–1.2)
Total Protein: 6 g/dL — ABNORMAL LOW (ref 6.5–8.1)

## 2023-08-01 LAB — PHOSPHORUS: Phosphorus: 1.7 mg/dL — ABNORMAL LOW (ref 2.5–4.6)

## 2023-08-01 LAB — CBC WITH DIFFERENTIAL/PLATELET
Abs Immature Granulocytes: 0.03 10*3/uL (ref 0.00–0.07)
Basophils Absolute: 0 10*3/uL (ref 0.0–0.1)
Basophils Relative: 1 %
Eosinophils Absolute: 0.1 10*3/uL (ref 0.0–0.5)
Eosinophils Relative: 2 %
HCT: 35.9 % — ABNORMAL LOW (ref 39.0–52.0)
Hemoglobin: 12.3 g/dL — ABNORMAL LOW (ref 13.0–17.0)
Immature Granulocytes: 1 %
Lymphocytes Relative: 27 %
Lymphs Abs: 1 10*3/uL (ref 0.7–4.0)
MCH: 35 pg — ABNORMAL HIGH (ref 26.0–34.0)
MCHC: 34.3 g/dL (ref 30.0–36.0)
MCV: 102.3 fL — ABNORMAL HIGH (ref 80.0–100.0)
Monocytes Absolute: 0.7 10*3/uL (ref 0.1–1.0)
Monocytes Relative: 18 %
Neutro Abs: 1.9 10*3/uL (ref 1.7–7.7)
Neutrophils Relative %: 51 %
Platelets: 100 10*3/uL — ABNORMAL LOW (ref 150–400)
RBC: 3.51 MIL/uL — ABNORMAL LOW (ref 4.22–5.81)
RDW: 13.8 % (ref 11.5–15.5)
WBC: 3.7 10*3/uL — ABNORMAL LOW (ref 4.0–10.5)
nRBC: 0 % (ref 0.0–0.2)

## 2023-08-01 LAB — MAGNESIUM: Magnesium: 2 mg/dL (ref 1.7–2.4)

## 2023-08-01 MED ORDER — POTASSIUM PHOSPHATES 15 MMOLE/5ML IV SOLN
30.0000 mmol | Freq: Once | INTRAVENOUS | Status: DC
  Filled 2023-08-01: qty 10

## 2023-08-01 MED ORDER — ENSURE ENLIVE PO LIQD
237.0000 mL | Freq: Two times a day (BID) | ORAL | Status: DC
  Administered 2023-08-01 – 2023-08-04 (×4): 237 mL via ORAL

## 2023-08-01 MED ORDER — POTASSIUM PHOSPHATES 15 MMOLE/5ML IV SOLN
30.0000 mmol | Freq: Once | INTRAVENOUS | Status: AC
  Administered 2023-08-01: 30 mmol via INTRAVENOUS
  Filled 2023-08-01: qty 10

## 2023-08-01 MED ORDER — POTASSIUM CHLORIDE CRYS ER 20 MEQ PO TBCR
40.0000 meq | EXTENDED_RELEASE_TABLET | Freq: Once | ORAL | Status: AC
  Administered 2023-08-01: 40 meq via ORAL
  Filled 2023-08-01: qty 2

## 2023-08-01 NOTE — Progress Notes (Signed)
 PROGRESS NOTE    Austin Brown  FMW:[id number] DOB: [birth year] DOA: 07/30/2023 PCP: Clarice Nottingham, MD   Brief Narrative:  The patient is a 53 year old Caucasian male with a past medical history significant for but limited to alcohol abuse, hypertension, asthma as well as other comorbidities who presents to the ED with symptoms concerning for alcohol withdrawal.  He states for last few days he did not drink alcohol with the intention of quitting alcohol but since then he started becoming tremulous and had 3 drinks before coming to the ED.  He has had on again off again diarrhea which is chronic but denied chest pain or shortness of breath or abdominal discomfort.  He denies any loss of consciousness weakness or any other issue going on and he states that he was recently seen by his PCP in last few months and states that his Coreg  has been increased.  In the ED his blood pressure was low normal and labs showed multiple electrolyte abnormalities including a sodium of 128 and a potassium of 2.5 and a magnesium  1.7.  EKG showed prolonged QTc of 538 ms.  He is given 2 L IV fluid bolus in the ED and given IV potassium and IV magnesium  and admitted for further management of his alcohol withdrawal. Slowly improving but continues to be Tremulous  Assessment and Plan:  Alcohol Abuse with Withdrawal -Will keep patient on CIWA protocol using po/IV Lorazepam  0-4 mg q6h for 48 hours followed by q12h for 48 hours and then 1 mg po/IV q1hprn Withdrawal Symptoms -C/w Folic Acid  1 mg po Daily, MVI + Minerals 1 tab po Daily, and Thiamine  100 mg po/IV daily  -CIWA scores are slowly improving but will need to continue monitor and ensure that his CIWA scores remain under 5 consistently prior to discharging  Hyponatremia -Has had poor oral intake and has had intermittent diarrhea; Na+ Trend: Recent Labs  Lab 07/30/23 2358 07/31/23 1050 08/01/23 0457  NA 128* 130* 137  -IVF Hydration now stopped after a day   -Checking Urine Sodium but never sent  -Continue to Monitor and Trend and repeat CMP in the AM  Hypokalemia --Has had poor oral intake and has had intermittent diarrhea; Patient's K+ Level Trend: Recent Labs  Lab 07/30/23 2358 07/31/23 1050 08/01/23 0457  K 2.5* 3.1* 3.2*  -Replete with p.o. KCl 40 mEq x 1 and IV K-Phos 30 mmol -Continue to Monitor and Replete as Necessary -Repeat CMP in the AM   Hypophosphatemia -Phos Level Trend: Recent Labs  Lab 07/31/23 1050 08/01/23 0457  PHOS 1.9* 1.7*  -Replete with IV K-Phos 30 mmol -Continue to monitor and replete as necessary -Repeat Phos level in a.m.  Hypomagnesemia -Has had poor oral intake and has had intermittent diarrhea; Patient's Mag Level Trend: Recent Labs  Lab 07/31/23 0250 07/31/23 1050 08/01/23 0457  MG 1.3* 1.4* 2.0  -Replete with IV Mag Sulfate 2 grams x2 yesterday -Continue to Monitor and Replete as Necessary -Repeat Mag in the AM   Low normal blood pressure -Likely from dehydration no definite signs of sepsis.  Received fluids -Held Coreg  was given IV fluid hydration which is now finished -Blood pressure is now better and increasing and will need to continue monitor blood pressures per protocol -Last blood pressure reading was 140/88 . History of Asthma -Takes as needed Albuterol  which has been changed to 3 mL IH q6hprn Wheezing and SOB  Depression and Anxiety -C/w Fluoxetine  40 mg po Daily   Polysubstance Abuse -UDS +  for Benzodiazepines and THC -Counseling given   Abnormal/Elevated LFTs  Hyperbilirubinemia -Likely from alcohol related.   -AST/ALT and Bilirubin Trend: Recent Labs  Lab 07/30/23 2358 07/31/23 1050 08/01/23 0457  AST 94* 89* 74*  ALT 41 35 31  BILITOT 2.2* 2.4* 1.9*  -Checked PT-INR to calculate hepatitis discriminant function score and was 11.6 and indicated a Good Prognosis  -Check acute hepatitis panel. -Continue to Monitor and Trend Hepatic Fxn Panel and repeat CMP in  the AM  Pancytopenia -CBC Trend: Recent Labs  Lab 07/30/23 2358 07/31/23 0511 08/01/23 0457  WBC 6.7 4.5 3.7*  HGB 15.0 12.9* 12.3*  HCT 41.7 36.3* 35.9*  MCV 99.8 100.3* 102.3*  PLT 146* 110* 100*  -Check Anemia Panel in the AM  -Continue to monitor for signs of bleeding; no overt bleeding noted Repeat CBC in the a.m.  Thrombocytopenia  -Likely from alcohol use. Appears to be chronic but is trending down. -Platelet Count Trend: Recent Labs  Lab 07/30/23 2358 07/31/23 0511 08/01/23 0457  PLT 146* 110* 100*  -Continue to Monitor for S/Sx of Bleeding; No overt bleeding noted -Repeat CBC in the AM  Prolonged QTc  -Recheck EKG after correction of electrolytes.  Avoid QT prolonging medications. -Last qTC was 538 ms and repeat today pending   Hypoalbuminemia -Patient's Albumin Trend: Recent Labs  Lab 07/30/23 2358 07/31/23 1050 08/01/23 0457  ALBUMIN 3.9 3.5 3.1*  -Continue to Monitor and Trend and repeat CMP in the AM  Class I Obesity -Complicates overall prognosis and care -Estimated body mass index is 30.94 kg/m as calculated from the following:   Height as of this encounter: 5' 10 (1.778 m).   Weight as of this encounter: 97.8 kg.  -Weight Loss and Dietary Counseling given   DVT prophylaxis: enoxaparin  (LOVENOX ) injection 40 mg Start: 07/31/23 1000    Code Status: Full Code Family Communication: No family currently at bedside  Disposition Plan:  Level of care: Progressive Status is: Inpatient Remains inpatient appropriate because: His further clinical improvement in his withdrawal symptoms   Consultants:  None  Procedures:  As delineated as above  Antimicrobials:  Anti-infectives (From admission, onward)    None       Subjective: Seen and examined at bedside continues to still be somewhat tremulous.  No nausea or vomiting.  States he slept okay.  Denies any lightheadedness or dizziness.  No other concerns or complaints this  time.  Objective: Vitals:   07/31/23 2210 08/01/23 0223 08/01/23 0525 08/01/23 1027  BP: (!) 148/97 (!) 152/98 (!) 142/97 (!) 140/88  Pulse:  83 82 91  Resp:   18   Temp:  98.2 F (36.8 C) 98.2 F (36.8 C)   TempSrc:  Oral Oral   SpO2:   94%   Weight:      Height:        Intake/Output Summary (Last 24 hours) at 08/01/2023 1314 Last data filed at 08/01/2023 0900 Gross per 24 hour  Intake 2209.72 ml  Output --  Net 2209.72 ml   Filed Weights   07/31/23 1509  Weight: 97.8 kg   Examination: Physical Exam:  Constitutional: WN/WD obese Caucasian male who is resting and continues to be a little tremulous Respiratory: Diminished to auscultation bilaterally, no wheezing, rales, rhonchi or crackles. Normal respiratory effort and patient is not tachypenic. No accessory muscle use.  Unlabored breathing Cardiovascular: RRR, no murmurs / rubs / gallops. S1 and S2 auscultated. No extremity edema Abdomen: Soft, non-tender, distended secondary to  body habitus. Bowel sounds positive.  GU: Deferred. Musculoskeletal: No clubbing / cyanosis of digits/nails. No joint deformity upper and lower extremities Skin: No rashes, lesions, ulcers on limited skin evaluation. No induration; Warm and dry.  Neurologic: CN 2-12 grossly intact with no focal deficits but does have some tremors. Romberg sign and cerebellar reflexes not assessed.  Psychiatric: Normal judgment and insight. Alert and oriented x 3. Normal mood and appropriate affect.   Data Reviewed: I have personally reviewed following labs and imaging studies  CBC: Recent Labs  Lab 07/30/23 2358 07/31/23 0511 08/01/23 0457  WBC 6.7 4.5 3.7*  NEUTROABS  --   --  1.9  HGB 15.0 12.9* 12.3*  HCT 41.7 36.3* 35.9*  MCV 99.8 100.3* 102.3*  PLT 146* 110* 100*   Basic Metabolic Panel: Recent Labs  Lab 07/30/23 2358 07/31/23 0250 07/31/23 0511 07/31/23 1050 08/01/23 0457  NA 128*  --   --  130* 137  K 2.5*  --   --  3.1* 3.2*  CL 82*  --    --  83* 98  CO2 29  --   --  32 30  GLUCOSE 99  --   --  82 93  BUN 10  --   --  10 8  CREATININE 0.99  --  0.87 0.99 0.66  CALCIUM 8.6*  --   --  8.5* 8.5*  MG  --  1.3*  --  1.4* 2.0  PHOS  --   --   --  1.9* 1.7*   GFR: Estimated Creatinine Clearance: 128.1 mL/min (by C-G formula based on SCr of 0.66 mg/dL). Liver Function Tests: Recent Labs  Lab 07/30/23 2358 07/31/23 1050 08/01/23 0457  AST 94* 89* 74*  ALT 41 35 31  ALKPHOS 107 92 89  BILITOT 2.2* 2.4* 1.9*  PROT 7.4 6.7 6.0*  ALBUMIN 3.9 3.5 3.1*   No results for input(s): LIPASE, AMYLASE in the last 168 hours. No results for input(s): AMMONIA in the last 168 hours. Coagulation Profile: Recent Labs  Lab 07/31/23 0653  INR 1.1   Cardiac Enzymes: No results for input(s): CKTOTAL, CKMB, CKMBINDEX, TROPONINI in the last 168 hours. BNP (last 3 results) No results for input(s): PROBNP in the last 8760 hours. HbA1C: No results for input(s): HGBA1C in the last 72 hours. CBG: No results for input(s): GLUCAP in the last 168 hours. Lipid Profile: No results for input(s): CHOL, HDL, LDLCALC, TRIG, CHOLHDL, LDLDIRECT in the last 72 hours. Thyroid  Function Tests: No results for input(s): TSH, T4TOTAL, FREET4, T3FREE, THYROIDAB in the last 72 hours. Anemia Panel: No results for input(s): VITAMINB12, FOLATE, FERRITIN, TIBC, IRON, RETICCTPCT in the last 72 hours. Sepsis Labs: No results for input(s): PROCALCITON, LATICACIDVEN in the last 168 hours.  No results found for this or any previous visit (from the past 240 hours).   Radiology Studies: No results found.  Scheduled Meds:  enoxaparin  (LOVENOX ) injection  40 mg Subcutaneous Q24H   feeding supplement  237 mL Oral BID BM   FLUoxetine   40 mg Oral Daily   folic acid   1 mg Oral Daily   LORazepam   0-4 mg Intravenous Q6H   Followed by   NOREEN ON 08/02/2023] LORazepam   0-4 mg Intravenous Q12H   multivitamin  with minerals  1 tablet Oral Daily   thiamine   100 mg Oral Daily   Or   thiamine   100 mg Intravenous Daily   Continuous Infusions:  potassium PHOSPHATE  30 mmol in dextrose  5 %  250 mL infusion 30 mmol (08/01/23 1032)    LOS: 1 day   Alejandro Marker, DO [facility] Available via Epic secure chat 7am-7pm After these hours, please refer to coverage provider listed on [email or web address] 08/01/2023, 1:14 PM

## 2023-08-01 NOTE — Plan of Care (Addendum)
 Patient Austin Brown 5, VSS, anxious and sometimes restless. PRN ativan  administered (PO). IV fluids per order. Patient able to ambulate to BR and chair. CIWA 4-11 this shift.   Problem: Education: Goal: Knowledge of General Education information will improve Description: Including pain rating scale, medication(s)/side effects and non-pharmacologic comfort measures Outcome: Progressing   Problem: Health Behavior/Discharge Planning: Goal: Ability to manage health-related needs will improve Outcome: Progressing   Problem: Clinical Measurements: Goal: Ability to maintain clinical measurements within normal limits will improve Outcome: Progressing Goal: Will remain free from infection Outcome: Progressing Goal: Diagnostic test results will improve Outcome: Progressing Goal: Respiratory complications will improve Outcome: Progressing Goal: Cardiovascular complication will be avoided Outcome: Progressing   Problem: Activity: Goal: Risk for activity intolerance will decrease Outcome: Progressing   Problem: Nutrition: Goal: Adequate nutrition will be maintained Outcome: Progressing   Problem: Coping: Goal: Level of anxiety will decrease Outcome: Progressing   Problem: Elimination: Goal: Will not experience complications related to bowel motility Outcome: Progressing Goal: Will not experience complications related to urinary retention Outcome: Progressing   Problem: Pain Management: Goal: General experience of comfort will improve Outcome: Progressing   Problem: Safety: Goal: Ability to remain free from injury will improve Outcome: Progressing   Problem: Skin Integrity: Goal: Risk for impaired skin integrity will decrease Outcome: Progressing

## 2023-08-01 NOTE — Plan of Care (Signed)

## 2023-08-02 DIAGNOSIS — E871 Hypo-osmolality and hyponatremia: Secondary | ICD-10-CM | POA: Diagnosis not present

## 2023-08-02 DIAGNOSIS — F10930 Alcohol use, unspecified with withdrawal, uncomplicated: Secondary | ICD-10-CM | POA: Diagnosis not present

## 2023-08-02 DIAGNOSIS — E876 Hypokalemia: Secondary | ICD-10-CM | POA: Diagnosis not present

## 2023-08-02 LAB — COMPREHENSIVE METABOLIC PANEL
ALT: 38 U/L (ref 0–44)
AST: 97 U/L — ABNORMAL HIGH (ref 15–41)
Albumin: 3.3 g/dL — ABNORMAL LOW (ref 3.5–5.0)
Alkaline Phosphatase: 117 U/L (ref 38–126)
Anion gap: 8 (ref 5–15)
BUN: 8 mg/dL (ref 6–20)
CO2: 31 mmol/L (ref 22–32)
Calcium: 9.2 mg/dL (ref 8.9–10.3)
Chloride: 97 mmol/L — ABNORMAL LOW (ref 98–111)
Creatinine, Ser: 0.66 mg/dL (ref 0.61–1.24)
GFR, Estimated: >60 mL/min (ref >60)
Glucose, Bld: 103 mg/dL — ABNORMAL HIGH (ref 70–99)
Potassium: 3.5 mmol/L (ref 3.5–5.1)
Sodium: 136 mmol/L (ref 135–145)
Total Bilirubin: 0.9 mg/dL (ref 0.0–1.2)
Total Protein: 6.4 g/dL — ABNORMAL LOW (ref 6.5–8.1)

## 2023-08-02 LAB — CBC WITH DIFFERENTIAL/PLATELET
Abs Immature Granulocytes: 0.1 10*3/uL — ABNORMAL HIGH (ref 0.00–0.07)
Basophils Absolute: 0 10*3/uL (ref 0.0–0.1)
Basophils Relative: 1 %
Eosinophils Absolute: 0.1 10*3/uL (ref 0.0–0.5)
Eosinophils Relative: 1 %
HCT: 36.3 % — ABNORMAL LOW (ref 39.0–52.0)
Hemoglobin: 12.8 g/dL — ABNORMAL LOW (ref 13.0–17.0)
Immature Granulocytes: 2 %
Lymphocytes Relative: 24 %
Lymphs Abs: 1.3 10*3/uL (ref 0.7–4.0)
MCH: 36.3 pg — ABNORMAL HIGH (ref 26.0–34.0)
MCHC: 35.3 g/dL (ref 30.0–36.0)
MCV: 102.8 fL — ABNORMAL HIGH (ref 80.0–100.0)
Monocytes Absolute: 0.8 10*3/uL (ref 0.1–1.0)
Monocytes Relative: 15 %
Neutro Abs: 3 10*3/uL (ref 1.7–7.7)
Neutrophils Relative %: 57 %
Platelets: 137 10*3/uL — ABNORMAL LOW (ref 150–400)
RBC: 3.53 MIL/uL — ABNORMAL LOW (ref 4.22–5.81)
RDW: 13.9 % (ref 11.5–15.5)
WBC: 5.3 10*3/uL (ref 4.0–10.5)
nRBC: 0.4 % — ABNORMAL HIGH (ref 0.0–0.2)

## 2023-08-02 LAB — MAGNESIUM: Magnesium: 1.8 mg/dL (ref 1.7–2.4)

## 2023-08-02 LAB — PHOSPHORUS: Phosphorus: 2.8 mg/dL (ref 2.5–4.6)

## 2023-08-02 LAB — VITAMIN B12: Vitamin B-12: 766 pg/mL (ref 180–914)

## 2023-08-02 MED ORDER — HYDRALAZINE HCL 20 MG/ML IJ SOLN
5.0000 mg | Freq: Four times a day (QID) | INTRAMUSCULAR | Status: AC | PRN
  Administered 2023-08-02 – 2023-08-03 (×2): 5 mg via INTRAVENOUS
  Filled 2023-08-02 (×2): qty 1

## 2023-08-02 MED ORDER — MAGNESIUM SULFATE 2 GM/50ML IV SOLN
2.0000 g | Freq: Once | INTRAVENOUS | Status: AC
  Administered 2023-08-02: 2 g via INTRAVENOUS
  Filled 2023-08-02: qty 50

## 2023-08-02 MED ORDER — CARVEDILOL 6.25 MG PO TABS
6.2500 mg | ORAL_TABLET | Freq: Two times a day (BID) | ORAL | Status: DC
  Administered 2023-08-02 – 2023-08-04 (×5): 6.25 mg via ORAL
  Filled 2023-08-02 (×5): qty 1

## 2023-08-02 MED ORDER — POTASSIUM CHLORIDE CRYS ER 20 MEQ PO TBCR
40.0000 meq | EXTENDED_RELEASE_TABLET | Freq: Once | ORAL | Status: AC
  Administered 2023-08-02: 40 meq via ORAL
  Filled 2023-08-02: qty 2

## 2023-08-02 NOTE — Progress Notes (Signed)
 PROGRESS NOTE    Austin Brown  FMW:[id number] DOB: 1970/11/03 DOA: 07/30/2023 PCP: Clarice Nottingham, MD   Brief Narrative:  The patient is a 53 year old Caucasian male with a past medical history significant for but limited to alcohol abuse, hypertension, asthma as well as other comorbidities who presents to the ED with symptoms concerning for alcohol withdrawal.  He states for last few days he did not drink alcohol with the intention of quitting alcohol but since then he started becoming tremulous and had 3 drinks before coming to the ED.  He has had on again off again diarrhea which is chronic but denied chest pain or shortness of breath or abdominal discomfort.  He denies any loss of consciousness weakness or any other issue going on and he states that he was recently seen by his PCP in last few months and states that his Coreg  has been increased.  In the ED his blood pressure was low normal and labs showed multiple electrolyte abnormalities including a sodium of 128 and a potassium of 2.5 and a magnesium  1.7.  EKG showed prolonged QTc of 538 ms.  He is given 2 L IV fluid bolus in the ED and given IV potassium and IV magnesium  and admitted for further management of his alcohol withdrawal. Slowly improving but continues to be Tremulous. Getting closer to being Discharged.   Assessment and Plan:  Alcohol Abuse with Withdrawal -Will keep patient on CIWA protocol using po/IV Lorazepam  0-4 mg q6h for 48 hours followed by q12h for 48 hours and then 1 mg po/IV q1hprn Withdrawal Symptoms -Last Drink was Wednesday evening  -C/w Folic Acid  1 mg po Daily, MVI + Minerals 1 tab po Daily, and Thiamine  100 mg po/IV daily  -CIWA scores are slowly improving but will need to continue monitor and ensure that his CIWA scores remain under 5 consistently prior to discharging; CIWA Scores ranging from 7-10 today   Hyponatremia, improved  -Has had poor oral intake and has had intermittent diarrhea; Na+ Trend: Recent  Labs  Lab 07/30/23 2358 07/31/23 1050 08/01/23 0457 08/02/23 0433  NA 128* 130* 137 136  -IVF Hydration now stopped after a day  -Checking Urine Sodium but never sent  -Continue to Monitor and Trend and repeat CMP in the AM  Hypokalemia -Has had poor oral intake and has had intermittent diarrhea; Patient's K+ Level Trend: Recent Labs  Lab 07/30/23 2358 07/31/23 1050 08/01/23 0457 08/02/23 0433  K 2.5* 3.1* 3.2* 3.5  -Replete with p.o. KCl 40 mEq x 1 again and IV K-Phos 30 mmol -Continue to Monitor and Replete as Necessary -Repeat CMP in the AM   Hypophosphatemia -Phos Level Trend: Recent Labs  Lab 07/31/23 1050 08/01/23 0457 08/02/23 0433  PHOS 1.9* 1.7* 2.8  -Replete with IV K-Phos 30 mmol yesterday  -Continue to monitor and replete as necessary -Repeat Phos level in a.m.  Hypomagnesemia -Has had poor oral intake and has had intermittent diarrhea; Patient's Mag Level Trend: Recent Labs  Lab 07/31/23 0250 07/31/23 1050 08/01/23 0457 08/02/23 0433  MG 1.3* 1.4* 2.0 1.8  -Replete with IV Mag Sulfate 2 grams today -Continue to Monitor and Replete as Necessary -Repeat Mag in the AM   Hypertension -Initially had low normal BP Likely from dehydration no definite signs of sepsis.  Received fluids -Held Coreg  was given IV fluid hydration which is now finished -Carvedilol  6.25 mg po BID has now be resumed  -Blood pressure is now better and increasing and will need to  continue monitor blood pressures per protocol -Last blood pressure reading was 138/103 . History of Asthma -Takes as needed Albuterol  which has been changed to 3 mL IH q6hprn Wheezing and SOB  Depression and Anxiety -C/w Fluoxetine  40 mg po Daily   Polysubstance Abuse -UDS + for Benzodiazepines and THC -Counseling given   Abnormal/Elevated LFTs  Hyperbilirubinemia -Likely from alcohol related.   -AST/ALT and Bilirubin Trend: Recent Labs  Lab 07/30/23 2358 07/31/23 1050 08/01/23 0457  08/02/23 0433  AST 94* 89* 74* 97*  ALT 41 35 31 38  BILITOT 2.2* 2.4* 1.9* 0.9  -Checked PT-INR to calculate hepatitis discriminant function score and was 11.6 and indicated a Good Prognosis  -Check Acute Hepatitis Panel. -Continue to Monitor and Trend Hepatic Fxn Panel and repeat CMP in the AM  Pancytopenia -CBC Trend: Recent Labs  Lab 07/30/23 2358 07/31/23 0511 08/01/23 0457 08/02/23 0921  WBC 6.7 4.5 3.7* 5.3  HGB 15.0 12.9* 12.3* 12.8*  HCT 41.7 36.3* 35.9* 36.3*  MCV 99.8 100.3* 102.3* 102.8*  PLT 146* 110* 100* 137*  -Check Anemia Panel in the AM  -Continue to monitor for signs of bleeding; no overt bleeding noted Repeat CBC in the a.m.  Thrombocytopenia  -Likely from alcohol use. Appears to be chronic but is trending down. -Platelet Count Trend: Recent Labs  Lab 07/30/23 2358 07/31/23 0511 08/01/23 0457 08/02/23 0921  PLT 146* 110* 100* 137*  -Continue to Monitor for S/Sx of Bleeding; No overt bleeding noted -Repeat CBC in the AM  Prolonged QTc  -Recheck EKG after correction of electrolytes.  Avoid QT prolonging medications. -Initial qTC was 538 ms and repeat was 456  Hypoalbuminemia -Patient's Albumin Trend: Recent Labs  Lab 07/30/23 2358 07/31/23 1050 08/01/23 0457 08/02/23 0433  ALBUMIN 3.9 3.5 3.1* 3.3*  -Continue to Monitor and Trend and repeat CMP in the AM  Class I Obesity -Complicates overall prognosis and care -Estimated body mass index is 30.94 kg/m as calculated from the following:   Height as of this encounter: 5' 10 (1.778 m).   Weight as of this encounter: 97.8 kg.  -Weight Loss and Dietary Counseling given   DVT prophylaxis: enoxaparin  (LOVENOX ) injection 40 mg Start: 07/31/23 1000    Code Status: Full Code Family Communication: No family present at bedside   Disposition Plan:  Level of care: Progressive Status is: Inpatient Remains inpatient appropriate because: His further clinical improvement and clearance    Consultants:  None  Procedures:  As needed as above  Antimicrobials:  Anti-infectives (From admission, onward)    None       Subjective: Seen and examined at bedside and was feeling little bit better but continues to be somewhat tremulous.  No nausea or vomiting.  Denies any lightheadedness or dizziness.  No other concerns or complaints at this time.  Objective: Vitals:   08/02/23 0444 08/02/23 0815 08/02/23 0942 08/02/23 1300  BP: (!) 149/103 (!) 149/107 (!) 151/100 (!) 138/103  Pulse: 83 92 90 92  Resp: 16     Temp: 97.8 F (36.6 C)   97.9 F (36.6 C)  TempSrc: Oral   Oral  SpO2: 95%   97%  Weight:      Height:        Intake/Output Summary (Last 24 hours) at 08/02/2023 1700 Last data filed at 08/02/2023 1300 Gross per 24 hour  Intake 720 ml  Output --  Net 720 ml   Filed Weights   07/31/23 1509  Weight: 97.8 kg  Examination: Physical Exam:  Constitutional: WN/WD obese Caucasian male who is resting anxious below bit tremulous Respiratory: Diminished to auscultation bilaterally, no wheezing, rales, rhonchi or crackles. Normal respiratory effort and patient is not tachypenic. No accessory muscle use.  Unlabored breathing Cardiovascular: RRR, no murmurs / rubs / gallops. S1 and S2 auscultated. No extremity edema.   Abdomen: Soft, non-tender, distended secondary body habitus. Bowel sounds positive.  GU: Deferred. Musculoskeletal: No clubbing / cyanosis of digits/nails. No joint deformity upper and lower extremities.  Skin: No rashes, lesions, ulcers, or skin evaluation. No induration; Warm and dry.  Neurologic: CN 2-12 grossly intact with no focal deficits but continues to be mildly tremulous. Psychiatric: Normal judgment and insight. Alert and oriented x 3. Normal mood and appropriate affect.   Data Reviewed: I have personally reviewed following labs and imaging studies  CBC: Recent Labs  Lab 07/30/23 2358 07/31/23 0511 08/01/23 0457 08/02/23 0921  WBC  6.7 4.5 3.7* 5.3  NEUTROABS  --   --  1.9 3.0  HGB 15.0 12.9* 12.3* 12.8*  HCT 41.7 36.3* 35.9* 36.3*  MCV 99.8 100.3* 102.3* 102.8*  PLT 146* 110* 100* 137*   Basic Metabolic Panel: Recent Labs  Lab 07/30/23 2358 07/31/23 0250 07/31/23 0511 07/31/23 1050 08/01/23 0457 08/02/23 0433  NA 128*  --   --  130* 137 136  K 2.5*  --   --  3.1* 3.2* 3.5  CL 82*  --   --  83* 98 97*  CO2 29  --   --  32 30 31  GLUCOSE 99  --   --  82 93 103*  BUN 10  --   --  10 8 8   CREATININE 0.99  --  0.87 0.99 0.66 0.66  CALCIUM 8.6*  --   --  8.5* 8.5* 9.2  MG  --  1.3*  --  1.4* 2.0 1.8  PHOS  --   --   --  1.9* 1.7* 2.8   GFR: Estimated Creatinine Clearance: 128.1 mL/min (by C-G formula based on SCr of 0.66 mg/dL). Liver Function Tests: Recent Labs  Lab 07/30/23 2358 07/31/23 1050 08/01/23 0457 08/02/23 0433  AST 94* 89* 74* 97*  ALT 41 35 31 38  ALKPHOS 107 92 89 117  BILITOT 2.2* 2.4* 1.9* 0.9  PROT 7.4 6.7 6.0* 6.4*  ALBUMIN 3.9 3.5 3.1* 3.3*   No results for input(s): LIPASE, AMYLASE in the last 168 hours. No results for input(s): AMMONIA in the last 168 hours. Coagulation Profile: Recent Labs  Lab 07/31/23 0653  INR 1.1   Cardiac Enzymes: No results for input(s): CKTOTAL, CKMB, CKMBINDEX, TROPONINI in the last 168 hours. BNP (last 3 results) No results for input(s): PROBNP in the last 8760 hours. HbA1C: No results for input(s): HGBA1C in the last 72 hours. CBG: No results for input(s): GLUCAP in the last 168 hours. Lipid Profile: No results for input(s): CHOL, HDL, LDLCALC, TRIG, CHOLHDL, LDLDIRECT in the last 72 hours. Thyroid  Function Tests: No results for input(s): TSH, T4TOTAL, FREET4, T3FREE, THYROIDAB in the last 72 hours. Anemia Panel: No results for input(s): VITAMINB12, FOLATE, FERRITIN, TIBC, IRON, RETICCTPCT in the last 72 hours. Sepsis Labs: No results for input(s): PROCALCITON, LATICACIDVEN in  the last 168 hours.  No results found for this or any previous visit (from the past 240 hours).   Radiology Studies: No results found.  Scheduled Meds:  carvedilol   6.25 mg Oral BID   enoxaparin  (LOVENOX ) injection  40 mg  Subcutaneous Q24H   feeding supplement  237 mL Oral BID BM   FLUoxetine   40 mg Oral Daily   folic acid   1 mg Oral Daily   LORazepam   0-4 mg Intravenous Q12H   multivitamin with minerals  1 tablet Oral Daily   thiamine   100 mg Oral Daily   Or   thiamine   100 mg Intravenous Daily   Continuous Infusions:   LOS: 2 days   Alejandro Marker, DO [facility] Available via Epic secure chat 7am-7pm After these hours, please refer to coverage provider listed on [email or web address] 08/02/2023, 5:00 PM

## 2023-08-02 NOTE — Plan of Care (Signed)
  Problem: Education: Goal: Knowledge of General Education information will improve Description: Including pain rating scale, medication(s)/side effects and non-pharmacologic comfort measures Outcome: Progressing   Problem: Pain Management: Goal: General experience of comfort will improve Outcome: Progressing   Problem: Safety: Goal: Ability to remain free from injury will improve Outcome: Progressing

## 2023-08-03 ENCOUNTER — Inpatient Hospital Stay (HOSPITAL_COMMUNITY): Payer: BC Managed Care – PPO

## 2023-08-03 DIAGNOSIS — E876 Hypokalemia: Secondary | ICD-10-CM | POA: Diagnosis not present

## 2023-08-03 DIAGNOSIS — F10930 Alcohol use, unspecified with withdrawal, uncomplicated: Secondary | ICD-10-CM | POA: Diagnosis not present

## 2023-08-03 DIAGNOSIS — R7989 Other specified abnormal findings of blood chemistry: Secondary | ICD-10-CM

## 2023-08-03 DIAGNOSIS — E871 Hypo-osmolality and hyponatremia: Secondary | ICD-10-CM | POA: Diagnosis not present

## 2023-08-03 LAB — RETICULOCYTES
Immature Retic Fract: 27.3 % — ABNORMAL HIGH (ref 2.3–15.9)
RBC.: 3.69 MIL/uL — ABNORMAL LOW (ref 4.22–5.81)
Retic Count, Absolute: 73.8 10*3/uL (ref 19.0–186.0)
Retic Ct Pct: 2 % (ref 0.4–3.1)

## 2023-08-03 LAB — CBC WITH DIFFERENTIAL/PLATELET
Abs Immature Granulocytes: 0.18 10*3/uL — ABNORMAL HIGH (ref 0.00–0.07)
Basophils Absolute: 0.1 10*3/uL (ref 0.0–0.1)
Basophils Relative: 1 %
Eosinophils Absolute: 0.1 10*3/uL (ref 0.0–0.5)
Eosinophils Relative: 1 %
HCT: 36.2 % — ABNORMAL LOW (ref 39.0–52.0)
Hemoglobin: 12.9 g/dL — ABNORMAL LOW (ref 13.0–17.0)
Immature Granulocytes: 3 %
Lymphocytes Relative: 19 %
Lymphs Abs: 1.4 10*3/uL (ref 0.7–4.0)
MCH: 36.4 pg — ABNORMAL HIGH (ref 26.0–34.0)
MCHC: 35.6 g/dL (ref 30.0–36.0)
MCV: 102.3 fL — ABNORMAL HIGH (ref 80.0–100.0)
Monocytes Absolute: 1.1 10*3/uL — ABNORMAL HIGH (ref 0.1–1.0)
Monocytes Relative: 14 %
Neutro Abs: 4.6 10*3/uL (ref 1.7–7.7)
Neutrophils Relative %: 62 %
Platelets: 158 10*3/uL (ref 150–400)
RBC: 3.54 MIL/uL — ABNORMAL LOW (ref 4.22–5.81)
RDW: 13.8 % (ref 11.5–15.5)
WBC: 7.3 10*3/uL (ref 4.0–10.5)
nRBC: 0.3 % — ABNORMAL HIGH (ref 0.0–0.2)

## 2023-08-03 LAB — COMPREHENSIVE METABOLIC PANEL
ALT: 50 U/L — ABNORMAL HIGH (ref 0–44)
AST: 121 U/L — ABNORMAL HIGH (ref 15–41)
Albumin: 3.7 g/dL (ref 3.5–5.0)
Alkaline Phosphatase: 93 U/L (ref 38–126)
Anion gap: 7 (ref 5–15)
BUN: 8 mg/dL (ref 6–20)
CO2: 28 mmol/L (ref 22–32)
Calcium: 9.3 mg/dL (ref 8.9–10.3)
Chloride: 100 mmol/L (ref 98–111)
Creatinine, Ser: 0.53 mg/dL — ABNORMAL LOW (ref 0.61–1.24)
GFR, Estimated: >60 mL/min (ref >60)
Glucose, Bld: 90 mg/dL (ref 70–99)
Potassium: 3.5 mmol/L (ref 3.5–5.1)
Sodium: 135 mmol/L (ref 135–145)
Total Bilirubin: 1.3 mg/dL — ABNORMAL HIGH (ref 0.0–1.2)
Total Protein: 7 g/dL (ref 6.5–8.1)

## 2023-08-03 LAB — HEPATITIS PANEL, ACUTE
HCV Ab: NONREACTIVE
Hep A IgM: NONREACTIVE
Hep B C IgM: NONREACTIVE
Hepatitis B Surface Ag: NONREACTIVE

## 2023-08-03 LAB — IRON AND TIBC
Iron: 60 ug/dL (ref 45–182)
Saturation Ratios: 20 % (ref 17.9–39.5)
TIBC: 304 ug/dL (ref 250–450)
UIBC: 244 ug/dL

## 2023-08-03 LAB — MAGNESIUM: Magnesium: 2 mg/dL (ref 1.7–2.4)

## 2023-08-03 LAB — FERRITIN: Ferritin: 1489 ng/mL — ABNORMAL HIGH (ref 24–336)

## 2023-08-03 LAB — FOLATE: Folate: 8.4 ng/mL (ref >5.9)

## 2023-08-03 LAB — PHOSPHORUS: Phosphorus: 2.8 mg/dL (ref 2.5–4.6)

## 2023-08-03 MED ORDER — ORAL CARE MOUTH RINSE: 15.0000 mL | OROMUCOSAL | Status: DC | PRN

## 2023-08-03 MED ORDER — LORAZEPAM 2 MG/ML IJ SOLN
0.0000 mg | Freq: Two times a day (BID) | INTRAMUSCULAR | Status: DC
  Administered 2023-08-03: 2 mg via INTRAVENOUS
  Filled 2023-08-03: qty 1

## 2023-08-03 MED ORDER — POTASSIUM CHLORIDE CRYS ER 20 MEQ PO TBCR
40.0000 meq | EXTENDED_RELEASE_TABLET | Freq: Two times a day (BID) | ORAL | Status: AC
  Administered 2023-08-03 – 2023-08-04 (×2): 40 meq via ORAL
  Filled 2023-08-03 (×2): qty 2

## 2023-08-03 NOTE — TOC Initial Note (Signed)
 Transition of Care [facility]) - Initial/Assessment Note   Patient Details  Name: Austin Brown MRN: [id number] Date of Birth: [birth year]  Transition of [facility]) CM/SW Contact:    Duwaine GORMAN [name], LCSW Phone Number: 08/03/2023, 10:49 AM  Clinical Narrative: TOC consulted for ETOH use resources. CSW met with patient to discuss consult. Patient reported he is already familiar with resources in the area stating, I've been to [facility] twice and declined resources at this time.  Expected Discharge Plan: Home/Self Care Barriers to Discharge: Continued Medical Work up  Patient Goals and CMS Choice Patient states their goals for this hospitalization and ongoing recovery are:: Return home after discharge Choice offered to / list presented to : NA  Expected Discharge Plan and Services In-house Referral: Clinical Social Work Post Acute Care Choice: NA Living arrangements for the past 2 months: Apartment            DME Arranged: N/A DME Agency: NA  Prior Living Arrangements/Services Living arrangements for the past 2 months: Apartment Lives with:: Self Patient language and need for interpreter reviewed:: Yes Do you feel safe going back to the place where you live?: Yes      Need for Family Participation in Patient Care: No (Comment) Care giver support system in place?: Yes (comment) Criminal Activity/Legal Involvement Pertinent to Current Situation/Hospitalization: No - Comment as needed  Activities of Daily Living ADL Screening (condition at time of admission) Independently performs ADLs?: Yes (appropriate for developmental age) Is the patient deaf or have difficulty hearing?: No Does the patient have difficulty seeing, even when wearing glasses/contacts?: No Does the patient have difficulty concentrating, remembering, or making decisions?: No  Emotional Assessment Appearance:: Appears stated age Attitude/Demeanor/Rapport: Engaged Affect (typically observed):  Appropriate Orientation: : Oriented to Self, Oriented to Place, Oriented to  Time, Oriented to Situation Alcohol / Substance Use: Alcohol Use Psych Involvement: No (comment)  Admission diagnosis:  Alcohol withdrawal (HCC) [F10.939] Hypokalemia [E87.6] Hypomagnesemia [E83.42] Alcohol withdrawal syndrome with complication [facility]) [F10.939] Patient Active Problem List   Diagnosis Date Noted   Alcohol withdrawal (HCC) 07/31/2023   Low blood pressure 07/31/2023   Electrolyte abnormality 07/31/2023   Nicotine  dependence 11/07/2021   Hyponatremia 11/06/2021   Alcohol abuse 11/06/2021   Hypokalemia 11/06/2021   Hypomagnesemia 11/06/2021   Essential hypertension 11/06/2021   Mild intermittent asthma without complication 11/06/2021   Prolonged QT interval 11/06/2021   PCP:  Clarice Nottingham, MD Pharmacy:   [facility] [address] [place] [address] Phone: [phone] Fax: [phone]  [id number] - [place], [place] - [address] AT [facility] [address] [place] [address] Phone: [phone] Fax: [phone]  Social Drivers of Health (SDOH) Social History: SDOH Screenings   Food Insecurity: No Food Insecurity (07/31/2023)  Housing: Low Risk  (07/31/2023)  Transportation Needs: No Transportation Needs (07/31/2023)  Utilities: Not At Risk (07/31/2023)  Social Connections: Moderately Isolated (07/31/2023)  Tobacco Use: High Risk (07/31/2023)   SDOH Interventions:    Readmission Risk Interventions     No data to display

## 2023-08-03 NOTE — Progress Notes (Signed)
 PROGRESS NOTE    Austin Brown  FMW:[id number] DOB: 21-Feb-1971 DOA: 07/30/2023 PCP: Clarice Nottingham, MD   Brief Narrative:  The patient is a 53 year old Caucasian male with a past medical history significant for but limited to alcohol abuse, hypertension, asthma as well as other comorbidities who presents to the ED with symptoms concerning for alcohol withdrawal.  He states for last few days he did not drink alcohol with the intention of quitting alcohol but since then he started becoming tremulous and had 3 drinks before coming to the ED.  He has had on again off again diarrhea which is chronic but denied chest pain or shortness of breath or abdominal discomfort.  He denies any loss of consciousness weakness or any other issue going on and he states that he was recently seen by his PCP in last few months and states that his Coreg  has been increased.  In the ED his blood pressure was low normal and labs showed multiple electrolyte abnormalities including a sodium of 128 and a potassium of 2.5 and a magnesium  1.7.  EKG showed prolonged QTc of 538 ms.  He is given 2 L IV fluid bolus in the ED and given IV potassium and IV magnesium  and admitted for further management of his alcohol withdrawal. Slowly improving but continues to be Tremulous. Getting closer to being Discharged and was planned to be discharged today but LFTs went up so we will work this up with a right upper quadrant ultrasound which is still pending read.  Will repeat LFTs in the morning if stable likely can be discharged given that his alcohol withdrawal symptoms are improved significantly.  Assessment and Plan:  Alcohol Abuse with Withdrawal, improving -Will keep patient on CIWA protocol using po/IV Lorazepam  0-4 mg q6h for 48 hours followed by q12h for 48 hours and then 1 mg po/IV q1hprn Withdrawal Symptoms -Last Drink was Wednesday evening  -C/w Folic Acid  1 mg po Daily, MVI + Minerals 1 tab po Daily, and Thiamine  100 mg po/IV daily   -CIWA scores are slowly improving but will need to continue monitor and ensure that his CIWA scores remain under 5 consistently prior to discharging; CIWA Scores ranging from 4-5  Hyponatremia, improved  -Has had poor oral intake and has had intermittent diarrhea; Na+ Trend: Recent Labs  Lab 07/30/23 2358 07/31/23 1050 08/01/23 0457 08/02/23 0433 08/03/23 0404  NA 128* 130* 137 136 135  -IVF Hydration now stopped  -Checking Urine Sodium but never sent  -Continue to Monitor and Trend and repeat CMP in the AM  Hypokalemia -Has had poor oral intake and has had intermittent diarrhea; Patient's K+ Level Trend: Recent Labs  Lab 07/30/23 2358 07/31/23 1050 08/01/23 0457 08/02/23 0433 08/03/23 0404  K 2.5* 3.1* 3.2* 3.5 3.5  -Replete with p.o. KCl 40 mEq BID x2 -Continue to Monitor and Replete as Necessary -Repeat CMP in the AM   Hypophosphatemia -Phos Level Trend: Recent Labs  Lab 07/31/23 1050 08/01/23 0457 08/02/23 0433 08/03/23 0404  PHOS 1.9* 1.7* 2.8 2.8  -Continue to monitor and replete as necessary -Repeat Phos level in a.m.  Hypomagnesemia -Has had poor oral intake and has had intermittent diarrhea; Patient's Mag Level Trend: Recent Labs  Lab 07/31/23 0250 07/31/23 1050 08/01/23 0457 08/02/23 0433 08/03/23 0404  MG 1.3* 1.4* 2.0 1.8 2.0  -Replete with IV Mag Sulfate 2 grams yesterday -Continue to Monitor and Replete as Necessary -Repeat Mag in the AM   Hypertension -Initially had low normal BP  Likely from dehydration no definite signs of sepsis.  Received fluids -Held Coreg  was given IV fluid hydration which is now finished -Carvedilol  6.25 mg po BID has now be resumed  -Blood pressure is now better and increasing and will need to continue monitor blood pressures per protocol -Last blood pressure reading was 126/82 . History of Asthma -Takes as needed Albuterol  which has been changed to 3 mL IH q6hprn Wheezing and SOB  Depression and Anxiety -C/w  Fluoxetine  40 mg po Daily   Polysubstance Abuse -UDS + for Benzodiazepines and THC -Counseling given   Abnormal/Elevated LFTs  Hyperbilirubinemia, slightly worsened from yesterday -Likely from alcohol related.   -AST/ALT and Bilirubin Trend: Recent Labs  Lab 07/30/23 2358 07/31/23 1050 08/01/23 0457 08/02/23 0433 08/03/23 0404  AST 94* 89* 74* 97* 121*  ALT 41 35 31 38 50*  BILITOT 2.2* 2.4* 1.9* 0.9 1.3*  -Checked PT-INR to calculate hepatitis discriminant function score and was 11.6 and indicated a Good Prognosis  -Check Acute Hepatitis Panel and Negative -Given Elevation today will obtain RUQ U/S and is done and pending read. -Continue to Monitor and Trend Hepatic Fxn Panel and repeat CMP in the AM  Pancytopenia -CBC Trend: Recent Labs  Lab 07/30/23 2358 07/31/23 0511 08/01/23 0457 08/02/23 0921 08/03/23 0404  WBC 6.7 4.5 3.7* 5.3 7.3  HGB 15.0 12.9* 12.3* 12.8* 12.9*  HCT 41.7 36.3* 35.9* 36.3* 36.2*  MCV 99.8 100.3* 102.3* 102.8* 102.3*  PLT 146* 110* 100* 137* 158  -Checked Anemia Panel showed an iron level of 60, UIBC 244, TIBC 304, saturation ratios of 20%, ferritin level 1489, folate level 8.4 and a vitamin B12 766 -Continue to monitor for signs of bleeding; no overt bleeding noted Repeat CBC in the a.m.  Thrombocytopenia  -Likely from alcohol use. Appears to be chronic but is trending down. -Platelet Count Trend: Recent Labs  Lab 07/30/23 2358 07/31/23 0511 08/01/23 0457 08/02/23 0921 08/03/23 0404  PLT 146* 110* 100* 137* 158  -Continue to Monitor for S/Sx of Bleeding; No overt bleeding noted -Repeat CBC in the AM  Prolonged QTc  -Recheck EKG after correction of electrolytes.  Avoid QT prolonging medications. -Initial qTC was 538 ms and repeat was 456  Hypoalbuminemia -Patient's Albumin Trend: Recent Labs  Lab 07/30/23 2358 07/31/23 1050 08/01/23 0457 08/02/23 0433 08/03/23 0404  ALBUMIN 3.9 3.5 3.1* 3.3* 3.7  -Continue to Monitor and  Trend and repeat CMP in the AM  Class I Obesity -Complicates overall prognosis and care -Estimated body mass index is 30.94 kg/m as calculated from the following:   Height as of this encounter: 5' 10 (1.778 m).   Weight as of this encounter: 97.8 kg.  -Weight Loss and Dietary Counseling given   DVT prophylaxis: enoxaparin  (LOVENOX ) injection 40 mg Start: 07/31/23 1000    Code Status: Full Code Family Communication: No family present at bedside  Disposition Plan:  Level of care: Progressive Status is: Inpatient Remains inpatient appropriate because: Anticipating discharging home in next 24 hours   Consultants:  None  Procedures:  As delineated as above  Antimicrobials:  Anti-infectives (From admission, onward)    None       Subjective: Seen and examined at bedside and his withdrawal symptoms are doing much better.  LFTs going up so discussed with him regarding a right upper quadrant ultrasound and this has been done and pending read.  No nausea or vomiting.  Feels okay otherwise.  Anticipating discharging home in next 24 hours if  her work and ultrasound is stable and LFTs are stable.  Objective: Vitals:   08/03/23 0057 08/03/23 0415 08/03/23 1227 08/03/23 1230  BP: (!) 147/103 (!) 134/91 126/82   Pulse: 85 81 79   Resp: 15 20 18    Temp: 98.1 F (36.7 C) 98.6 F (37 C)  98.2 F (36.8 C)  TempSrc: Oral Oral  Oral  SpO2: 97% 94% 95%   Weight:      Height:        Intake/Output Summary (Last 24 hours) at 08/03/2023 1918 Last data filed at 08/03/2023 0945 Gross per 24 hour  Intake 240 ml  Output --  Net 240 ml   Filed Weights   07/31/23 1509  Weight: 97.8 kg   Examination: Physical Exam:  Constitutional: WN/WD obese Caucasian male in no acute distress Respiratory: Diminished to auscultation bilaterally, no wheezing, rales, rhonchi or crackles. Normal respiratory effort and patient is not tachypenic. No accessory muscle use.  Cardiovascular: RRR, no murmurs  / rubs / gallops. S1 and S2 auscultated. No extremity edema.   Abdomen: Soft, non-tender, distended secondary to body habitus. Bowel sounds positive.  GU: Deferred. Musculoskeletal: No clubbing / cyanosis of digits/nails. No joint deformity upper and lower extremities.  Skin: No rashes, lesions, ulcers on limited skin evaluation. No induration; Warm and dry.  Neurologic: CN 2-12 grossly intact with no focal deficits. Romberg sign and cerebellar reflexes not assessed.  Psychiatric: Normal judgment and insight. Alert and oriented x 3. Normal mood and appropriate affect.   Data Reviewed: I have personally reviewed following labs and imaging studies  CBC: Recent Labs  Lab 07/30/23 2358 07/31/23 0511 08/01/23 0457 08/02/23 0921 08/03/23 0404  WBC 6.7 4.5 3.7* 5.3 7.3  NEUTROABS  --   --  1.9 3.0 4.6  HGB 15.0 12.9* 12.3* 12.8* 12.9*  HCT 41.7 36.3* 35.9* 36.3* 36.2*  MCV 99.8 100.3* 102.3* 102.8* 102.3*  PLT 146* 110* 100* 137* 158   Basic Metabolic Panel: Recent Labs  Lab 07/30/23 2358 07/31/23 0250 07/31/23 0511 07/31/23 1050 08/01/23 0457 08/02/23 0433 08/03/23 0404  NA 128*  --   --  130* 137 136 135  K 2.5*  --   --  3.1* 3.2* 3.5 3.5  CL 82*  --   --  83* 98 97* 100  CO2 29  --   --  32 30 31 28   GLUCOSE 99  --   --  82 93 103* 90  BUN 10  --   --  10 8 8 8   CREATININE 0.99  --  0.87 0.99 0.66 0.66 0.53*  CALCIUM 8.6*  --   --  8.5* 8.5* 9.2 9.3  MG  --  1.3*  --  1.4* 2.0 1.8 2.0  PHOS  --   --   --  1.9* 1.7* 2.8 2.8   GFR: Estimated Creatinine Clearance: 128.1 mL/min (A) (by C-G formula based on SCr of 0.53 mg/dL (L)). Liver Function Tests: Recent Labs  Lab 07/30/23 2358 07/31/23 1050 08/01/23 0457 08/02/23 0433 08/03/23 0404  AST 94* 89* 74* 97* 121*  ALT 41 35 31 38 50*  ALKPHOS 107 92 89 117 93  BILITOT 2.2* 2.4* 1.9* 0.9 1.3*  PROT 7.4 6.7 6.0* 6.4* 7.0  ALBUMIN 3.9 3.5 3.1* 3.3* 3.7   No results for input(s): LIPASE, AMYLASE in the last 168  hours. No results for input(s): AMMONIA in the last 168 hours. Coagulation Profile: Recent Labs  Lab 07/31/23 0653  INR 1.1  Cardiac Enzymes: No results for input(s): CKTOTAL, CKMB, CKMBINDEX, TROPONINI in the last 168 hours. BNP (last 3 results) No results for input(s): PROBNP in the last 8760 hours. HbA1C: No results for input(s): HGBA1C in the last 72 hours. CBG: No results for input(s): GLUCAP in the last 168 hours. Lipid Profile: No results for input(s): CHOL, HDL, LDLCALC, TRIG, CHOLHDL, LDLDIRECT in the last 72 hours. Thyroid  Function Tests: No results for input(s): TSH, T4TOTAL, FREET4, T3FREE, THYROIDAB in the last 72 hours. Anemia Panel: Recent Labs    08/02/23 1708 08/03/23 0404  VITAMINB12 766  --   FOLATE  --  8.4  FERRITIN  --  1,489*  TIBC  --  304  IRON  --  60  RETICCTPCT  --  2.0   Sepsis Labs: No results for input(s): PROCALCITON, LATICACIDVEN in the last 168 hours.  No results found for this or any previous visit (from the past 240 hours).   Radiology Studies: No results found.  Scheduled Meds:  carvedilol   6.25 mg Oral BID   enoxaparin  (LOVENOX ) injection  40 mg Subcutaneous Q24H   feeding supplement  237 mL Oral BID BM   FLUoxetine   40 mg Oral Daily   folic acid   1 mg Oral Daily   LORazepam   0-4 mg Intravenous Q12H   multivitamin with minerals  1 tablet Oral Daily   potassium chloride   40 mEq Oral BID   thiamine   100 mg Oral Daily   Or   thiamine   100 mg Intravenous Daily   Continuous Infusions:   LOS: 3 days   Alejandro Marker, DO [facility] Available via Epic secure chat 7am-7pm After these hours, please refer to coverage provider listed on [email or web address] 08/03/2023, 7:18 PM

## 2023-08-03 NOTE — Plan of Care (Signed)

## 2023-08-04 DIAGNOSIS — E871 Hypo-osmolality and hyponatremia: Secondary | ICD-10-CM | POA: Diagnosis not present

## 2023-08-04 DIAGNOSIS — F10930 Alcohol use, unspecified with withdrawal, uncomplicated: Secondary | ICD-10-CM | POA: Diagnosis not present

## 2023-08-04 DIAGNOSIS — E876 Hypokalemia: Secondary | ICD-10-CM | POA: Diagnosis not present

## 2023-08-04 LAB — COMPREHENSIVE METABOLIC PANEL
ALT: 64 U/L — ABNORMAL HIGH (ref 0–44)
AST: 131 U/L — ABNORMAL HIGH (ref 15–41)
Albumin: 3.4 g/dL — ABNORMAL LOW (ref 3.5–5.0)
Alkaline Phosphatase: 88 U/L (ref 38–126)
Anion gap: 9 (ref 5–15)
BUN: 11 mg/dL (ref 6–20)
CO2: 24 mmol/L (ref 22–32)
Calcium: 9 mg/dL (ref 8.9–10.3)
Chloride: 98 mmol/L (ref 98–111)
Creatinine, Ser: 0.76 mg/dL (ref 0.61–1.24)
GFR, Estimated: >60 mL/min (ref >60)
Glucose, Bld: 87 mg/dL (ref 70–99)
Potassium: 3.8 mmol/L (ref 3.5–5.1)
Sodium: 131 mmol/L — ABNORMAL LOW (ref 135–145)
Total Bilirubin: 1.1 mg/dL (ref 0.0–1.2)
Total Protein: 6.6 g/dL (ref 6.5–8.1)

## 2023-08-04 LAB — CBC WITH DIFFERENTIAL/PLATELET
Abs Immature Granulocytes: 0.16 10*3/uL — ABNORMAL HIGH (ref 0.00–0.07)
Basophils Absolute: 0.1 10*3/uL (ref 0.0–0.1)
Basophils Relative: 1 %
Eosinophils Absolute: 0.1 10*3/uL (ref 0.0–0.5)
Eosinophils Relative: 1 %
HCT: 37.9 % — ABNORMAL LOW (ref 39.0–52.0)
Hemoglobin: 12.8 g/dL — ABNORMAL LOW (ref 13.0–17.0)
Immature Granulocytes: 2 %
Lymphocytes Relative: 20 %
Lymphs Abs: 1.5 10*3/uL (ref 0.7–4.0)
MCH: 35.5 pg — ABNORMAL HIGH (ref 26.0–34.0)
MCHC: 33.8 g/dL (ref 30.0–36.0)
MCV: 105 fL — ABNORMAL HIGH (ref 80.0–100.0)
Monocytes Absolute: 1.3 10*3/uL — ABNORMAL HIGH (ref 0.1–1.0)
Monocytes Relative: 18 %
Neutro Abs: 4.4 10*3/uL (ref 1.7–7.7)
Neutrophils Relative %: 58 %
Platelets: 163 10*3/uL (ref 150–400)
RBC: 3.61 MIL/uL — ABNORMAL LOW (ref 4.22–5.81)
RDW: 14.4 % (ref 11.5–15.5)
WBC: 7.5 10*3/uL (ref 4.0–10.5)
nRBC: 0 % (ref 0.0–0.2)

## 2023-08-04 LAB — MAGNESIUM: Magnesium: 2 mg/dL (ref 1.7–2.4)

## 2023-08-04 LAB — PHOSPHORUS: Phosphorus: 3.6 mg/dL (ref 2.5–4.6)

## 2023-08-04 MED ORDER — ENSURE ENLIVE PO LIQD: 237.0000 mL | Freq: Two times a day (BID) | ORAL | 12 refills | Status: DC

## 2023-08-04 MED ORDER — ADULT MULTIVITAMIN W/MINERALS CH: 1.0000 | ORAL_TABLET | Freq: Every day | ORAL | 0 refills | Status: DC

## 2023-08-04 NOTE — Discharge Summary (Signed)
 Physician Discharge Summary   Patient: Austin Brown MRN: [id number] DOB: 07/08/1971  Admit date:     07/30/2023  Discharge date: 08/04/23  Discharge Physician: Alejandro Marker, DO    PCP: Clarice Nottingham, MD   Recommendations at discharge:   Follow up with PCP within 1 to 2 weeks and repeat CBC, CMP, mag, Phos within 1 week  Discharge Diagnoses: Principal Problem:   Alcohol withdrawal (HCC) Active Problems:   Hyponatremia   Hypokalemia   Hypomagnesemia   Essential hypertension   Mild intermittent asthma without complication   Prolonged QT interval   Nicotine  dependence   Low blood pressure   Electrolyte abnormality  Resolved Problems:   * No resolved hospital problems. [facility] Course: The patient is a 53 year old Caucasian male with a past medical history significant for but limited to alcohol abuse, hypertension, asthma as well as other comorbidities who presents to the ED with symptoms concerning for alcohol withdrawal.  He states for last few days he did not drink alcohol with the intention of quitting alcohol but since then he started becoming tremulous and had 3 drinks before coming to the ED.  He has had on again off again diarrhea which is chronic but denied chest pain or shortness of breath or abdominal discomfort.  He denies any loss of consciousness weakness or any other issue going on and he states that he was recently seen by his PCP in last few months and states that his Coreg  has been increased.  In the ED his blood pressure was low normal and labs showed multiple electrolyte abnormalities including a sodium of 128 and a potassium of 2.5 and a magnesium  1.7.  EKG showed prolonged QTc of 538 ms.  He is given 2 L IV fluid bolus in the ED and given IV potassium and IV magnesium  and admitted for further management of his alcohol withdrawal. Slowly improving but continues to be Tremulous. Getting closer to being Discharged and was planned to be discharged today but LFTs went up  so we will work this up with a right upper quadrant ultrasound which showed hepatic steatosis.  LFTs are still mildly elevated but relatively stable and likely can be discharged and follow-up these in outpatient setting within 1 week.  Assessment and Plan:  Alcohol Abuse with Withdrawal, improving -Will keep patient on CIWA protocol using po/IV Lorazepam  0-4 mg q6h for 48 hours followed by q12h for 48 hours and then 1 mg po/IV q1hprn Withdrawal Symptoms -Last Drink was Wednesday evening  -C/w Folic Acid  1 mg po Daily, MVI + Minerals 1 tab po Daily, and Thiamine  100 mg po/IV daily  -CIWA scores are slowly improving but will need to continue monitor and ensure that his CIWA scores remain under 5 consistently prior to discharging; CIWA Scores ranging from 4-5  Hyponatremia, improved  -Has had poor oral intake and has had intermittent diarrhea; Na+ Trend: Recent Labs  Lab 07/30/23 2358 07/31/23 1050 08/01/23 0457 08/02/23 0433 08/03/23 0404 08/04/23 0358  NA 128* 130* 137 136 135 131*  -IVF Hydration now stopped  -Checking Urine Sodium but never sent  -Continue to Monitor and Trend and repeat CMP in the AM  Hypokalemia -Has had poor oral intake and has had intermittent diarrhea; Patient's K+ Level Trend: Recent Labs  Lab 07/30/23 2358 07/31/23 1050 08/01/23 0457 08/02/23 0433 08/03/23 0404 08/04/23 0358  K 2.5* 3.1* 3.2* 3.5 3.5 3.8  -Replete with p.o. KCl 40 mEq BID x2 -Continue to Monitor and Replete  as Necessary -Repeat CMP within 1 week  Hypophosphatemia -Phos Level Trend: Recent Labs  Lab 07/31/23 1050 08/01/23 0457 08/02/23 0433 08/03/23 0404 08/04/23 0358  PHOS 1.9* 1.7* 2.8 2.8 3.6  -Continue to monitor and replete as necessary -Repeat Phos Level within 1 week   Hypomagnesemia -Has had poor oral intake and has had intermittent diarrhea; Patient's Mag Level Trend: Recent Labs  Lab 07/31/23 0250 07/31/23 1050 08/01/23 0457 08/02/23 0433 08/03/23 0404  08/04/23 0358  MG 1.3* 1.4* 2.0 1.8 2.0 2.0  -Continue to Monitor and Replete as Necessary -Repeat Mag in the AM   Hyponatremia -Na+ Trend: Recent Labs  Lab 07/30/23 2358 07/31/23 1050 08/01/23 0457 08/02/23 0433 08/03/23 0404 08/04/23 0358  NA 128* 130* 137 136 135 131*  -Continue to Monitor and Trend in the outpatient setting within 1 week  Hypertension -Initially had low normal BP Likely from dehydration no definite signs of sepsis.  Received fluids -Held Coreg  was given IV fluid hydration which is now finished -Carvedilol  6.25 mg po BID has now be resumed  -Blood pressure is now better and increasing and will need to continue monitor blood pressures per protocol -Last blood pressure reading was 112/90 . History of Asthma -Takes as needed Albuterol  which has been changed to 3 mL IH q6hprn Wheezing and SOB  Depression and Anxiety -C/w Fluoxetine  40 mg po Daily   Polysubstance Abuse -UDS + for Benzodiazepines and THC -Counseling given   Abnormal/Elevated LFTs  Hyperbilirubinemia, slightly worsened from yesterday -Likely from alcohol related.   -AST/ALT and Bilirubin Trend: Recent Labs  Lab 07/30/23 2358 07/31/23 1050 08/01/23 0457 08/02/23 0433 08/03/23 0404 08/04/23 0358  AST 94* 89* 74* 97* 121* 131*  ALT 41 35 31 38 50* 64*  BILITOT 2.2* 2.4* 1.9* 0.9 1.3* 1.1  -Checked PT-INR to calculate hepatitis discriminant function score and was 11.6 and indicated a Good Prognosis  -Check Acute Hepatitis Panel and Negative -Given Elevation today will obtain RUQ U/S which showed Hepatic steatosis. Please note limited evaluation for focal hepatic masses in a patient with hepatic steatosis due to decreased penetration of the acoustic ultrasound waves. -Continue to Monitor and Trend Hepatic Fxn Panel and repeat CMP within 1 week  Pancytopenia -CBC Trend: Recent Labs  Lab 07/30/23 2358 07/31/23 0511 08/01/23 0457 08/02/23 0921 08/03/23 0404 08/04/23 0358  WBC  6.7 4.5 3.7* 5.3 7.3 7.5  HGB 15.0 12.9* 12.3* 12.8* 12.9* 12.8*  HCT 41.7 36.3* 35.9* 36.3* 36.2* 37.9*  MCV 99.8 100.3* 102.3* 102.8* 102.3* 105.0*  PLT 146* 110* 100* 137* 158 163  -Checked Anemia Panel showed an iron level of 60, UIBC 244, TIBC 304, saturation ratios of 20%, ferritin level 1489, folate level 8.4 and a vitamin B12 766 -Continue to monitor for signs of bleeding; no overt bleeding noted Repeat CBC within 1 week  Thrombocytopenia  -Likely from alcohol use. Appears to be chronic but is trending down. -Platelet Count Trend: Recent Labs  Lab 07/30/23 2358 07/31/23 0511 08/01/23 0457 08/02/23 0921 08/03/23 0404 08/04/23 0358  PLT 146* 110* 100* 137* 158 163  -Continue to Monitor for S/Sx of Bleeding; No overt bleeding noted -Repeat CBC within 1 week   Prolonged QTc  -Recheck EKG after correction of electrolytes.  Avoid QT prolonging medications. -Initial qTC was 538 ms and repeat was 456  Hypoalbuminemia -Patient's Albumin Trend: Recent Labs  Lab 07/30/23 2358 07/31/23 1050 08/01/23 0457 08/02/23 0433 08/03/23 0404 08/04/23 0358  ALBUMIN 3.9 3.5 3.1* 3.3* 3.7 3.4*  -  Continue to Monitor and Trend and repeat CMP within 1 week  Class I Obesity -Complicates overall prognosis and care -Estimated body mass index is 30.94 kg/m as calculated from the following:   Height as of this encounter: 5' 10 (1.778 m).   Weight as of this encounter: 97.8 kg.  -Weight Loss and Dietary Counseling given  Consultants: No family present at bedside Procedures performed: As delineated as above  Disposition: Home Diet recommendation:  Discharge Diet Orders (From admission, onward)     Start     Ordered   08/04/23 0000  Diet - low sodium heart healthy        08/04/23 1241           Cardiac diet DISCHARGE MEDICATION: Allergies as of 08/04/2023       Reactions   Amoxicillin Other (See Comments)   unknown        Medication List     STOP taking these  medications    acetaminophen  500 MG tablet Commonly known as: TYLENOL    azithromycin 250 MG tablet Commonly known as: ZITHROMAX   potassium chloride  SA 20 MEQ tablet Commonly known as: KLOR-CON  M       TAKE these medications    albuterol  108 (90 Base) MCG/ACT inhaler Commonly known as: VENTOLIN  HFA Inhale 2 puffs into the lungs every 6 (six) hours as needed for wheezing or shortness of breath.   carvedilol  6.25 MG tablet Commonly known as: COREG  Take 6.25 mg by mouth 2 (two) times daily. What changed: Another medication with the same name was removed. Continue taking this medication, and follow the directions you see here.   famotidine 20 MG tablet Commonly known as: PEPCID Take 20 mg by mouth daily.   feeding supplement Liqd Take 237 mLs by mouth 2 (two) times daily between meals.   fexofenadine 180 MG tablet Commonly known as: ALLEGRA Take 180 mg by mouth daily as needed for allergies or rhinitis.   FLUoxetine  40 MG capsule Commonly known as: PROZAC  Take 40 mg by mouth daily.   fluticasone  50 MCG/ACT nasal spray Commonly known as: FLONASE  Place 2 sprays into both nostrils daily. 2 sprays each nostril at night   folic acid  1 MG tablet Commonly known as: FOLVITE  Take 1 tablet (1 mg total) by mouth daily.   LUMIFY  OP Place 1 drop into both eyes daily.   metFORMIN 500 MG tablet Commonly known as: GLUCOPHAGE Take 500 mg by mouth daily.   multivitamin with minerals Tabs tablet Take 1 tablet by mouth daily. Start taking on: August 05, 2023   naltrexone 50 MG tablet Commonly known as: DEPADE Take 50 mg by mouth daily.   nicotine  21 mg/24hr patch Commonly known as: NICODERM CQ  - dosed in mg/24 hours Place 1 patch (21 mg total) onto the skin daily.   Potassium Chloride  ER 20 MEQ Tbcr Take 1 tablet by mouth daily.   thiamine  100 MG tablet Commonly known as: VITAMIN B1 Take 1 tablet (100 mg total) by mouth daily.   traZODone  100 MG tablet Commonly  known as: DESYREL  Take 100 mg by mouth at bedtime as needed for sleep.        Follow-up Information     Clarice Nottingham, MD. Call.   Specialty: Internal Medicine Why: Follow up within 1-2 weeks Contact information: [address] [place] [address] [phone]                Discharge Exam: Fredricka Weights   07/31/23  1509  Weight: 97.8 kg   Vitals:   08/03/23 2009 08/04/23 0500  BP: 139/89 (!) 112/90  Pulse: 77 76  Resp: 18 18  Temp: 99.1 F (37.3 C) 98 F (36.7 C)  SpO2: 95% 96%   Examination: Physical Exam:  Constitutional: WN/WD obese Caucasian male in NAD Respiratory: Diminished to auscultation bilaterally, no wheezing, rales, rhonchi or crackles. Normal respiratory effort and patient is not tachypenic. No accessory muscle use. Unlabored breathing  Cardiovascular: RRR, no murmurs / rubs / gallops. S1 and S2 auscultated. No extremity edema.   Abdomen: Soft, non-tender, Distended 2/2 body habitus. Bowel sounds positive.  GU: Deferred. Musculoskeletal: No clubbing / cyanosis of digits/nails. No joint deformity upper and lower extremities.  Skin: No rashes, lesions, ulcers on a limited skin evaluation. No induration; Warm and dry.  Neurologic: CN 2-12 grossly intact with no focal deficits. Romberg sign and cerebellar reflexes not assessed.  Psychiatric: Normal judgment and insight. Alert and oriented x 3. Normal mood and appropriate affect.   Condition at discharge: stable  The results of significant diagnostics from this hospitalization (including imaging, microbiology, ancillary and laboratory) are listed below for reference.   Imaging Studies: US  Abdomen Limited RUQ (LIVER/GB) Result Date: 08/03/2023 CLINICAL DATA:  402470 Abnormal LFTs 402470 EXAM: ULTRASOUND ABDOMEN LIMITED RIGHT UPPER QUADRANT COMPARISON:  None Available. FINDINGS: Gallbladder: No gallstones or wall thickening visualized. No sonographic Murphy sign noted by sonographer.  Common bile duct: Diameter: 2 mm. Liver: No focal lesion identified. Increased parenchymal echogenicity. Portal vein is patent on color Doppler imaging with normal direction of blood flow towards the liver. Other: Fluid dense lesion within the right kidney likely represents a simple renal cyst-no further follow-up indicated IMPRESSION: Hepatic steatosis. Please note limited evaluation for focal hepatic masses in a patient with hepatic steatosis due to decreased penetration of the acoustic ultrasound waves. Electronically Signed   By: Morgane  Naveau M.D.   On: 08/03/2023 22:59   Microbiology: No results found for this or any previous visit.  Labs: CBC: Recent Labs  Lab 07/31/23 0511 08/01/23 0457 08/02/23 0921 08/03/23 0404 08/04/23 0358  WBC 4.5 3.7* 5.3 7.3 7.5  NEUTROABS  --  1.9 3.0 4.6 4.4  HGB 12.9* 12.3* 12.8* 12.9* 12.8*  HCT 36.3* 35.9* 36.3* 36.2* 37.9*  MCV 100.3* 102.3* 102.8* 102.3* 105.0*  PLT 110* 100* 137* 158 163   Basic Metabolic Panel: Recent Labs  Lab 07/31/23 1050 08/01/23 0457 08/02/23 0433 08/03/23 0404 08/04/23 0358  NA 130* 137 136 135 131*  K 3.1* 3.2* 3.5 3.5 3.8  CL 83* 98 97* 100 98  CO2 32 30 31 28 24   GLUCOSE 82 93 103* 90 87  BUN 10 8 8 8 11   CREATININE 0.99 0.66 0.66 0.53* 0.76  CALCIUM 8.5* 8.5* 9.2 9.3 9.0  MG 1.4* 2.0 1.8 2.0 2.0  PHOS 1.9* 1.7* 2.8 2.8 3.6   Liver Function Tests: Recent Labs  Lab 07/31/23 1050 08/01/23 0457 08/02/23 0433 08/03/23 0404 08/04/23 0358  AST 89* 74* 97* 121* 131*  ALT 35 31 38 50* 64*  ALKPHOS 92 89 117 93 88  BILITOT 2.4* 1.9* 0.9 1.3* 1.1  PROT 6.7 6.0* 6.4* 7.0 6.6  ALBUMIN 3.5 3.1* 3.3* 3.7 3.4*   CBG: No results for input(s): GLUCAP in the last 168 hours.  Discharge time spent: greater than 30 minutes.  Signed: Alejandro Marker, DO [facility] 08/04/2023

## 2023-08-04 NOTE — Plan of Care (Signed)
  Problem: Education: Goal: Knowledge of General Education information will improve Description: Including pain rating scale, medication(s)/side effects and non-pharmacologic comfort measures Outcome: Progressing   Problem: Clinical Measurements: Goal: Respiratory complications will improve Outcome: Progressing Goal: Cardiovascular complication will be avoided Outcome: Progressing   Problem: Nutrition: Goal: Adequate nutrition will be maintained Outcome: Progressing   Problem: Coping: Goal: Level of anxiety will decrease Outcome: Progressing

## 2023-08-04 NOTE — Plan of Care (Signed)
  Problem: Clinical Measurements: Goal: Respiratory complications will improve Outcome: Progressing Goal: Cardiovascular complication will be avoided Outcome: Progressing   Problem: Activity: Goal: Risk for activity intolerance will decrease Outcome: Progressing   Problem: Safety: Goal: Ability to remain free from injury will improve Outcome: Progressing   Problem: Skin Integrity: Goal: Risk for impaired skin integrity will decrease Outcome: Progressing   Problem: Self-Concept: Goal: Level of anxiety will decrease Outcome: Progressing

## 2023-08-05 ENCOUNTER — Telehealth: Payer: Self-pay | Admitting: *Deleted

## 2023-08-05 NOTE — Transitions of Care (Post Inpatient/ED Visit) (Signed)
   08/05/2023  Name: Austin Brown MRN: [id number] DOB: 09/08/1970  Today's TOC FU Call Status: Today's TOC FU Call Status:: Unsuccessful Call (1st Attempt) Unsuccessful Call (1st Attempt) Date: 08/05/23  Attempted to reach the patient regarding the most recent Inpatient/ED visit.  Follow Up Plan: Additional outreach attempts will be made to reach the patient to complete the Transitions of Care (Post Inpatient/ED visit) call.   Una Ganser BSN RN Population Health- Transition of Care Team.  [facility] [phone]

## 2023-08-06 ENCOUNTER — Telehealth: Payer: Self-pay

## 2023-08-06 NOTE — Transitions of Care (Post Inpatient/ED Visit) (Signed)
   08/06/2023  Name: BRIGGS ALCIDE MRN: [id number] DOB: 22-Apr-1971  Today's TOC FU Call Status: Today's TOC FU Call Status:: Unsuccessful Call (2nd Attempt) Unsuccessful Call (2nd Attempt) Date: 08/06/23  Attempted to reach the patient regarding the most recent Inpatient/ED visit. Left HIPAA compliant confidential VM to personal mail box.   Follow Up Plan: Additional outreach attempts will be made to reach the patient to complete the Transitions of Care (Post Inpatient/ED visit) call.   Gabriel Cirri MSN, RN Care Coordinator [facility] Office Hours Wed/Thur  8:00 am-6:00 pm Direct Dial: [phone] Main Phone [phone]  Fax: [phone]

## 2023-08-07 ENCOUNTER — Telehealth: Payer: Self-pay

## 2023-08-07 NOTE — Transitions of Care (Post Inpatient/ED Visit) (Signed)
08/07/2023  Name: Austin Brown MRN: [id number] DOB: 01/18/1971  Today's TOC FU Call Status: Today's TOC FU Call Status:: Successful TOC FU Call Completed TOC FU Call Complete Date: 08/07/23 Patient's Name and Date of Birth confirmed.  Transition Care Management Follow-up Telephone Call Date of Discharge: 08/04/23 Discharge Facility: [facility]) Type of Discharge: Inpatient Admission Primary Inpatient Discharge Diagnosis:: Alcohol withdrawal How have you been since you were released from the hospital?: Better Any questions or concerns?: No  Items Reviewed: Did you receive and understand the discharge instructions provided?: Yes Medications obtained,verified, and reconciled?: Yes (Medications Reviewed) Any new allergies since your discharge?: No Dietary orders reviewed?: Yes Type of Diet Ordered:: Low Sodium Heart Healthy diet was advised, along with taking additional vitamins, Folic Acid 1mg  daily, Multi-Vitamin&Mineral daily, and Thiamine daily as ordered Do you have support at home?: Yes People in Home: alone Name of Support/Comfort Primary Source: lives alone but has supportive parents who live nearby  Medications Reviewed Today: Medications Reviewed Today     Reviewed by Marcos Eke, RN (Registered Nurse) on 08/07/23 at 1430  Med List Status: <None>   Medication Order Taking? Sig Documenting Provider Last Dose Status Informant  albuterol (VENTOLIN HFA) 108 (90 Base) MCG/ACT inhaler [id number] Yes Inhale 2 puffs into the lungs every 6 (six) hours as needed for wheezing or shortness of breath. [provider] Taking Active Self, Pharmacy Records           Med Note Jomarie Longs, [facility]   Wed Nov 06, 2021 10:05 PM)    Brimonidine Tartrate (LUMIFY OP) [id number] Yes Place 1 drop into both eyes daily. [provider] Taking Active Self, Pharmacy Records  carvedilol (COREG) 6.25 MG tablet [id number] Yes Take 6.25 mg by mouth 2 (two) times daily. [provider] Taking Active Self, Pharmacy Records  famotidine (PEPCID) 20 MG tablet [id number] Yes Take 20 mg by mouth daily. [provider] Taking Active Self, Pharmacy Records  feeding supplement (ENSURE ENLIVE / ENSURE PLUS) LIQD [id number] Yes Take 237 mLs by mouth 2 (two) times daily between meals. Sheikh, Omair [place], [place] Taking Active   fexofenadine (ALLEGRA) 180 MG tablet [id number] Yes Take 180 mg by mouth daily as needed for allergies or rhinitis. [provider] Taking Active Self, Pharmacy Records  FLUoxetine (PROZAC) 40 MG capsule [id number] Yes Take 40 mg by mouth daily. [provider] Taking Active Self, Pharmacy Records  fluticasone [facility]) 50 MCG/ACT nasal spray [id number] Yes Place 2 sprays into both nostrils daily. 2 sprays each nostril at night  Patient taking differently: Place 2 sprays into both nostrils at bedtime.   Drema Halon, MD Taking Active Self, Pharmacy Records  folic acid (FOLVITE) 1 MG tablet [id number] Yes Take 1 tablet (1 mg total) by mouth daily. Alba Cory, MD Taking Active Self, Pharmacy Records  metFORMIN (GLUCOPHAGE) 500 MG tablet [id number] Yes Take 500 mg by mouth daily. [provider] Taking Active Self, Pharmacy Records  Multiple Vitamin (MULTIVITAMIN WITH MINERALS) TABS tablet [id number] Yes Take 1 tablet by mouth daily. Sheikh, Omair [place], [place] Taking Active   naltrexone (DEPADE) 50 MG tablet [id number] Yes Take 50 mg by mouth daily. [provider] Taking Active Self, Pharmacy Records  nicotine (NICODERM CQ - DOSED IN MG/24 HOURS) 21 mg/24hr patch [id number] No Place 1 patch (21 mg total) onto the skin daily.  Patient not taking: Reported on 07/31/2023   Alba Cory, MD Unknown Active Self, Pharmacy Records  Potassium  Chloride ER 20 MEQ TBCR [id number] Yes Take 1 tablet by mouth daily. [provider] Taking Active Self, Pharmacy Records  thiamine 100 MG tablet [id number] Yes Take 1  tablet (100 mg total) by mouth daily. Alba Cory, MD Taking Active Self, Pharmacy Records  traZODone (DESYREL) 100 MG tablet [id number] Yes Take 100 mg by mouth at bedtime as needed for sleep. [provider] Taking Active Self, Pharmacy Records           Med Note Jomarie Longs, [facility]   Wed Nov 06, 2021 10:06 PM)              Home Care and Equipment/Supplies: Were Home Health Services Ordered?: NA Any new equipment or medical supplies ordered?: NA  Functional Questionnaire: Do you need assistance with bathing/showering or dressing?: No Do you need assistance with meal preparation?: No Do you need assistance with eating?: No Do you have difficulty maintaining continence: No Do you need assistance with getting out of bed/getting out of a chair/moving?: No Do you have difficulty managing or taking your medications?: No  Follow up appointments reviewed: PCP Follow-up appointment confirmed?: Yes Follow-up Provider: Patient states he has follow up with Dr. Renne Crigler "next week" Specialist Hospital Follow-up appointment confirmed?: NA Do you need transportation to your follow-up appointment?: No Do you understand care options if your condition(s) worsen?: Yes-patient verbalized understanding    Alyse Low, RN, BA, [facility], CRRN [facility] Population Health Care Management Coordinator, Transition of Care Ph # [phone]

## 2023-08-24 ENCOUNTER — Other Ambulatory Visit: Payer: Self-pay | Admitting: Internal Medicine

## 2023-08-24 DIAGNOSIS — F17210 Nicotine dependence, cigarettes, uncomplicated: Secondary | ICD-10-CM

## 2023-09-29 ENCOUNTER — Ambulatory Visit (INDEPENDENT_AMBULATORY_CARE_PROVIDER_SITE_OTHER)
Admission: EM | Admit: 2023-09-29 | Discharge: 2023-09-30 | Disposition: A | Discharge status: Other Acute Inpatient Hospital | Source: Home / Self Care

## 2023-09-29 DIAGNOSIS — F1010 Alcohol abuse, uncomplicated: Secondary | ICD-10-CM | POA: Diagnosis not present

## 2023-09-29 DIAGNOSIS — F1210 Cannabis abuse, uncomplicated: Secondary | ICD-10-CM | POA: Diagnosis not present

## 2023-09-29 DIAGNOSIS — E871 Hypo-osmolality and hyponatremia: Secondary | ICD-10-CM | POA: Diagnosis not present

## 2023-09-29 DIAGNOSIS — J45909 Unspecified asthma, uncomplicated: Secondary | ICD-10-CM | POA: Diagnosis not present

## 2023-09-29 DIAGNOSIS — F17210 Nicotine dependence, cigarettes, uncomplicated: Secondary | ICD-10-CM | POA: Diagnosis not present

## 2023-09-29 DIAGNOSIS — F10939 Alcohol use, unspecified with withdrawal, unspecified: Secondary | ICD-10-CM | POA: Diagnosis not present

## 2023-09-29 DIAGNOSIS — Z79899 Other long term (current) drug therapy: Secondary | ICD-10-CM | POA: Diagnosis not present

## 2023-09-29 DIAGNOSIS — F1220 Cannabis dependence, uncomplicated: Secondary | ICD-10-CM | POA: Insufficient documentation

## 2023-09-29 DIAGNOSIS — I10 Essential (primary) hypertension: Secondary | ICD-10-CM | POA: Diagnosis not present

## 2023-09-29 DIAGNOSIS — F10931 Alcohol use, unspecified with withdrawal delirium: Secondary | ICD-10-CM | POA: Diagnosis not present

## 2023-09-29 DIAGNOSIS — R4589 Other symptoms and signs involving emotional state: Secondary | ICD-10-CM | POA: Diagnosis not present

## 2023-09-29 DIAGNOSIS — E876 Hypokalemia: Secondary | ICD-10-CM | POA: Insufficient documentation

## 2023-09-29 DIAGNOSIS — R000 Tachycardia, unspecified: Secondary | ICD-10-CM | POA: Diagnosis not present

## 2023-09-29 DIAGNOSIS — Z880 Allergy status to penicillin: Secondary | ICD-10-CM | POA: Diagnosis not present

## 2023-09-29 DIAGNOSIS — E639 Nutritional deficiency, unspecified: Secondary | ICD-10-CM | POA: Diagnosis not present

## 2023-09-29 DIAGNOSIS — D696 Thrombocytopenia, unspecified: Secondary | ICD-10-CM | POA: Diagnosis not present

## 2023-09-29 DIAGNOSIS — Z9181 History of falling: Secondary | ICD-10-CM | POA: Diagnosis not present

## 2023-09-29 DIAGNOSIS — F418 Other specified anxiety disorders: Secondary | ICD-10-CM | POA: Insufficient documentation

## 2023-09-29 DIAGNOSIS — F1020 Alcohol dependence, uncomplicated: Secondary | ICD-10-CM | POA: Insufficient documentation

## 2023-09-29 DIAGNOSIS — R0682 Tachypnea, not elsewhere classified: Secondary | ICD-10-CM | POA: Diagnosis not present

## 2023-09-29 DIAGNOSIS — R569 Unspecified convulsions: Secondary | ICD-10-CM | POA: Diagnosis not present

## 2023-09-29 DIAGNOSIS — Y907 Blood alcohol level of 200-239 mg/100 ml: Secondary | ICD-10-CM | POA: Insufficient documentation

## 2023-09-29 DIAGNOSIS — F10239 Alcohol dependence with withdrawal, unspecified: Secondary | ICD-10-CM | POA: Diagnosis not present

## 2023-09-29 DIAGNOSIS — F10229 Alcohol dependence with intoxication, unspecified: Secondary | ICD-10-CM | POA: Diagnosis not present

## 2023-09-29 DIAGNOSIS — F10929 Alcohol use, unspecified with intoxication, unspecified: Secondary | ICD-10-CM | POA: Diagnosis not present

## 2023-09-29 DIAGNOSIS — F10930 Alcohol use, unspecified with withdrawal, uncomplicated: Secondary | ICD-10-CM | POA: Diagnosis not present

## 2023-09-29 DIAGNOSIS — G40509 Epileptic seizures related to external causes, not intractable, without status epilepticus: Secondary | ICD-10-CM | POA: Diagnosis not present

## 2023-09-29 DIAGNOSIS — Z7984 Long term (current) use of oral hypoglycemic drugs: Secondary | ICD-10-CM | POA: Diagnosis not present

## 2023-09-29 MED ORDER — OLANZAPINE 10 MG IM SOLR
10.0000 mg | Freq: Three times a day (TID) | INTRAMUSCULAR | Status: DC | PRN
Start: 2023-09-29 — End: 2023-09-30

## 2023-09-29 MED ORDER — MAGNESIUM HYDROXIDE 400 MG/5ML PO SUSP: 30.0000 mL | Freq: Every day | ORAL | Status: DC | PRN

## 2023-09-29 MED ORDER — ALUM & MAG HYDROXIDE-SIMETH 200-200-20 MG/5ML PO SUSP: 30.0000 mL | ORAL | Status: DC | PRN

## 2023-09-29 MED ORDER — ACETAMINOPHEN 325 MG PO TABS
650.0000 mg | ORAL_TABLET | Freq: Four times a day (QID) | ORAL | Status: DC | PRN
  Administered 2023-09-30: 650 mg via ORAL
  Filled 2023-09-29: qty 2

## 2023-09-29 MED ORDER — OLANZAPINE 10 MG IM SOLR: 5.0000 mg | Freq: Three times a day (TID) | INTRAMUSCULAR | Status: DC | PRN

## 2023-09-29 MED ORDER — OLANZAPINE 5 MG PO TBDP: 5.0000 mg | ORAL_TABLET | Freq: Three times a day (TID) | ORAL | Status: DC | PRN

## 2023-09-29 NOTE — ED Provider Notes (Signed)
 [facility] Continuous Assessment Admission H&P  Date: 09/30/23 Patient Name: Austin Brown MRN: [id number] Chief Complaint: looking for alcohol detox  Diagnoses:  Final diagnoses:  Alcohol abuse  Marijuana abuse  Anxious appearance    HPI: Austin Brown, 53 y/o male with a history of alcohol abuse,  presented to [facility] voluntarily.  Per the patient he strive to get detox from alcohol.  According to the patient he has tried detox in the past he was sober for 2 years after detox and that [facility].  He also stated his most recent detox program was at [facility] for 5 days a couple weeks ago.  However patient reports relapse.  According to patient he works as a Production designer, theatre/television/film person, lives with his family.  Patient denies seeing a psychiatrist or therapist at this time.  Per the patient he used to go to [facility] but had stopped going for a while.  According to patient he has been drinking alcohol since he was 53 years old.  And according to him he will drink 14 shots a day.  Face-to-face evaluation of patient, patient is alert and oriented x 4, speech is clear, maintain eye contact.  Patient answer questions appropriately.  Pleasant overall.  Patient denies SI, HI, AVH or paranoia.  Reported consuming at least 14 cans of liquor on a daily basis.  Patient reports he also smoked marijuana every day.  Patient denies any other illicit drug use at this time.  Patient denies access to guns, denies wanting to hurt himself or others. At this time patient does not seem to be influenced by external or internal stimuli does not pose a risk to himself or others.  Writer discussed with patient the admission process to [facility] and what is expected.  Patient verbalized understanding with the plan of care.  PHQ9 completed pt scored 16  Recommend observation with possible admission to [facility] when a bed becomes available tomorrow.  Total Time spent with patient: 30 minutes  Musculoskeletal  Strength &  Muscle Tone: within normal limits Gait & Station: normal Patient leans: N/A  Psychiatric Specialty Exam  Presentation General Appearance:  Casual  Eye Contact: Good  Speech: Clear and Coherent  Speech Volume: Normal  Handedness: Right   Mood and Affect  Mood: Anxious  Affect: Congruent   Thought Process  Thought Processes: Coherent  Descriptions of Associations:Intact  Orientation:Full (Time, Place and Person)  Thought Content:Logical    Hallucinations:Hallucinations: None  Ideas of Reference:None  Suicidal Thoughts:Suicidal Thoughts: No  Homicidal Thoughts:Homicidal Thoughts: No   Sensorium  Memory: Immediate Good  Judgment: Fair  Insight: Fair   Art therapist  Concentration: Good  Attention Span: Good  Recall: Good  Fund of Knowledge: Good  Language: Good   Psychomotor Activity  Psychomotor Activity: Psychomotor Activity: Normal   Assets  Assets: Desire for Improvement; Resilience   Sleep  Sleep: Sleep: Fair Number of Hours of Sleep: 8   Nutritional Assessment (For OBS and FBC admissions only) Has the patient had a weight loss or gain of 10 pounds or more in the last 3 months?: No Has the patient had a decrease in food intake/or appetite?: No Does the patient have dental problems?: No Does the patient have eating habits or behaviors that may be indicators of an eating disorder including binging or inducing vomiting?: No Has the patient recently lost weight without trying?: 0 Has the patient been eating poorly because of a decreased appetite?: 0 Malnutrition Screening Tool Score: 0  Physical Exam HENT:     Head: Normocephalic.     Nose: Nose normal.  Eyes:     Pupils: Pupils are equal, round, and reactive to light.  Cardiovascular:     Rate and Rhythm: Normal rate.  Pulmonary:     Effort: Pulmonary effort is normal.  Musculoskeletal:        General: Normal range of motion.     Cervical back:  Normal range of motion.  Neurological:     General: No focal deficit present.     Mental Status: He is alert.  Psychiatric:        Mood and Affect: Mood normal.        Behavior: Behavior normal.        Thought Content: Thought content normal.        Judgment: Judgment normal.    Review of Systems  Constitutional: Negative.   HENT: Negative.    Eyes: Negative.   Respiratory: Negative.    Cardiovascular: Negative.   Gastrointestinal: Negative.   Genitourinary: Negative.   Musculoskeletal: Negative.   Skin: Negative.   Neurological: Negative.   Psychiatric/Behavioral:  Positive for substance abuse. The patient is nervous/anxious.     Blood pressure 110/73, pulse 100, temperature 98.2 F (36.8 C), temperature source Oral, resp. rate 18, SpO2 98%. There is no height or weight on file to calculate BMI.  Past Psychiatric History: Alcohol abuse, marijuana abuse.  Is the patient at risk to self? No  Has the patient been a risk to self in the past 6 months? No .    Has the patient been a risk to self within the distant past? No   Is the patient a risk to others? No   Has the patient been a risk to others in the past 6 months? No   Has the patient been a risk to others within the distant past? No   Past Medical History: see chart  Family History: unknown  Social History: alcohol and marijuana  Last Labs:  Admission on 09/29/2023  Component Date Value Ref Range Status   WBC 09/29/2023 6.4  4.0 - 10.5 K/uL Final   RBC 09/29/2023 3.91 (L)  4.22 - 5.81 MIL/uL Final   Hemoglobin 09/29/2023 14.3  13.0 - 17.0 g/dL Final   HCT 16/04/9603 38.5 (L)  39.0 - 52.0 % Final   MCV 09/29/2023 98.5  80.0 - 100.0 fL Final   MCH 09/29/2023 36.6 (H)  26.0 - 34.0 pg Final   MCHC 09/29/2023 37.1 (H)  30.0 - 36.0 g/dL Final   RDW 54/03/8118 13.2  11.5 - 15.5 % Final   Platelets 09/29/2023 126 (L)  150 - 400 K/uL Final   nRBC 09/29/2023 0.0  0.0 - 0.2 % Final   Neutrophils Relative % 09/29/2023  61  % Final   Neutro Abs 09/29/2023 3.9  1.7 - 7.7 K/uL Final   Lymphocytes Relative 09/29/2023 21  % Final   Lymphs Abs 09/29/2023 1.4  0.7 - 4.0 K/uL Final   Monocytes Relative 09/29/2023 16  % Final   Monocytes Absolute 09/29/2023 1.0  0.1 - 1.0 K/uL Final   Eosinophils Relative 09/29/2023 1  % Final   Eosinophils Absolute 09/29/2023 0.1  0.0 - 0.5 K/uL Final   Basophils Relative 09/29/2023 1  % Final   Basophils Absolute 09/29/2023 0.0  0.0 - 0.1 K/uL Final   Immature Granulocytes 09/29/2023 0  % Final   Abs Immature Granulocytes 09/29/2023 0.02  0.00 - 0.07 K/uL  Final   Performed at [facility], [address] [place], [place] [address]   Sodium 09/29/2023 129 (L)  135 - 145 mmol/L Final   Potassium 09/29/2023 2.1 (LL)  3.5 - 5.1 mmol/L Final   CRITICAL RESULT CALLED TO, READ BACK BY AND VERIFIED WITH S.MOGNF RN [id number] 09/30/2023 BY G.GANADEN   Chloride 09/29/2023 79 (L)  98 - 111 mmol/L Final   CO2 09/29/2023 30  22 - 32 mmol/L Final   Glucose, Bld 09/29/2023 84  70 - 99 mg/dL Final   Glucose reference range applies only to samples taken after fasting for at least 8 hours.   BUN 09/29/2023 <5 (L)  6 - 20 mg/dL Final   Creatinine, Ser 09/29/2023 0.76  0.61 - 1.24 mg/dL Final   Calcium 78/46/9629 8.7 (L)  8.9 - 10.3 mg/dL Final   Total Protein 52/84/1324 6.3 (L)  6.5 - 8.1 g/dL Final   Albumin 40/04/2724 3.6  3.5 - 5.0 g/dL Final   AST 36/64/4034 79 (H)  15 - 41 U/L Final   ALT 09/29/2023 25  0 - 44 U/L Final   Alkaline Phosphatase 09/29/2023 85  38 - 126 U/L Final   Total Bilirubin 09/29/2023 1.4 (H)  0.0 - 1.2 mg/dL Final   GFR, Estimated 09/29/2023 >60  >60 mL/min Final   Comment: (NOTE) Calculated using the CKD-EPI Creatinine Equation (2021)    Anion gap 09/29/2023 20 (H)  5 - 15 Final   Performed at [facility], [address] [place], [place] [address]   Alcohol, Ethyl (B) 09/29/2023 205 (H)  <10 mg/dL Final   Comment: (NOTE) Lowest detectable limit for  serum alcohol is 10 mg/dL.  For medical purposes only. Performed at [facility], [address] [place], [place] [address]    TSH 09/29/2023 1.743  0.350 - 4.500 uIU/mL Final   Comment: Performed by a 3rd Generation assay with a functional sensitivity of <=0.01 uIU/mL. Performed at [facility], [address], [place], [place] [address]   No results displayed because visit has over 200 results.    Admission on 05/12/2023, Discharged on 05/12/2023  Component Date Value Ref Range Status   Sodium 05/12/2023 137  135 - 145 mmol/L Final   Potassium 05/12/2023 3.1 (L)  3.5 - 5.1 mmol/L Final   Chloride 05/12/2023 95 (L)  98 - 111 mmol/L Final   CO2 05/12/2023 31  22 - 32 mmol/L Final   Glucose, Bld 05/12/2023 109 (H)  70 - 99 mg/dL Final   Glucose reference range applies only to samples taken after fasting for at least 8 hours.   BUN 05/12/2023 5 (L)  6 - 20 mg/dL Final   Creatinine, Ser 05/12/2023 0.82  0.61 - 1.24 mg/dL Final   Calcium 29/51/8841 9.5  8.9 - 10.3 mg/dL Final   Total Protein 66/12/3014 7.8  6.5 - 8.1 g/dL Final   Albumin 07/29/3233 4.5  3.5 - 5.0 g/dL Final   AST 57/32/2025 37  15 - 41 U/L Final   ALT 05/12/2023 15  0 - 44 U/L Final   Alkaline Phosphatase 05/12/2023 82  38 - 126 U/L Final   Total Bilirubin 05/12/2023 0.8  0.3 - 1.2 mg/dL Final   GFR, Estimated 05/12/2023 >60  >60 mL/min Final   Comment: (NOTE) Calculated using the CKD-EPI Creatinine Equation (2021)    Anion gap 05/12/2023 11  5 - 15 Final   Performed at [facility],  [address] [place], [place] [address]   Alcohol, Ethyl (B) 05/12/2023 227 (H)  <10 mg/dL Final   Comment: (NOTE) Lowest detectable limit for serum alcohol is 10 mg/dL.  For medical purposes only. Performed at [facility], [address] [place], [place] [address]    WBC 05/12/2023 6.0  4.0 - 10.5 K/uL Final   RBC 05/12/2023 5.16  4.22 - 5.81 MIL/uL Final   Hemoglobin  05/12/2023 17.3 (H)  13.0 - 17.0 g/dL Final   HCT 09/81/1914 49.5  39.0 - 52.0 % Final   MCV 05/12/2023 95.9  80.0 - 100.0 fL Final   MCH 05/12/2023 33.5  26.0 - 34.0 pg Final   MCHC 05/12/2023 34.9  30.0 - 36.0 g/dL Final   RDW 78/29/5621 16.4 (H)  11.5 - 15.5 % Final   Platelets 05/12/2023 130 (L)  150 - 400 K/uL Final   nRBC 05/12/2023 0.0  0.0 - 0.2 % Final   Performed at [facility], [address] [place], [place] [address]    Allergies: Amoxicillin  Medications:  Facility Ordered Medications  Medication   acetaminophen (TYLENOL) tablet 650 mg   alum & mag hydroxide-simeth (MAALOX/MYLANTA) 200-200-20 MG/5ML suspension 30 mL   magnesium hydroxide (MILK OF MAGNESIA) suspension 30 mL   OLANZapine zydis (ZYPREXA) disintegrating tablet 5 mg   OLANZapine (ZYPREXA) injection 5 mg   OLANZapine (ZYPREXA) injection 10 mg   [COMPLETED] potassium chloride SA (KLOR-CON M) CR tablet 40 mEq   [COMPLETED] thiamine (VITAMIN B1) injection 100 mg   [START ON 10/01/2023] thiamine (VITAMIN B1) tablet 100 mg   multivitamin with minerals tablet 1 tablet   LORazepam (ATIVAN) tablet 1 mg   hydrOXYzine (ATARAX) tablet 25 mg   loperamide (IMODIUM) capsule 2-4 mg   LORazepam (ATIVAN) tablet 1 mg   Followed by   Melene Muller ON 10/01/2023] LORazepam (ATIVAN) tablet 1 mg   Followed by   Melene Muller ON 10/02/2023] LORazepam (ATIVAN) tablet 1 mg   Followed by   Melene Muller ON 10/03/2023] LORazepam (ATIVAN) tablet 1 mg   PTA Medications  Medication Sig   fexofenadine (ALLEGRA) 180 MG tablet Take 180 mg by mouth daily as needed for allergies or rhinitis.   fluticasone (FLONASE) 50 MCG/ACT nasal spray Place 2 sprays into both nostrils daily. 2 sprays each nostril at night (Patient taking differently: Place 2 sprays into both nostrils at bedtime.)   traZODone (DESYREL) 100 MG tablet Take 100 mg by mouth at bedtime as needed for sleep.   albuterol (VENTOLIN HFA) 108 (90 Base) MCG/ACT inhaler Inhale 2  puffs into the lungs every 6 (six) hours as needed for wheezing or shortness of breath.   Brimonidine Tartrate (LUMIFY OP) Place 1 drop into both eyes daily.   folic acid (FOLVITE) 1 MG tablet Take 1 tablet (1 mg total) by mouth daily.   nicotine (NICODERM CQ - DOSED IN MG/24 HOURS) 21 mg/24hr patch Place 1 patch (21 mg total) onto the skin daily. (Patient not taking: Reported on 07/31/2023)   thiamine 100 MG tablet Take 1 tablet (100 mg total) by mouth daily.   FLUoxetine (PROZAC) 40 MG capsule Take 40 mg by mouth daily.   metFORMIN (GLUCOPHAGE) 500 MG tablet Take 500 mg by mouth daily.   Potassium Chloride ER 20 MEQ TBCR Take 1 tablet by mouth daily.   naltrexone (DEPADE) 50 MG tablet Take 50 mg by mouth daily.   famotidine (PEPCID) 20 MG tablet Take 20 mg by mouth daily.   carvedilol (  COREG) 6.25 MG tablet Take 6.25 mg by mouth 2 (two) times daily.   feeding supplement (ENSURE ENLIVE / ENSURE PLUS) LIQD Take 237 mLs by mouth 2 (two) times daily between meals.   Multiple Vitamin (MULTIVITAMIN WITH MINERALS) TABS tablet Take 1 tablet by mouth daily.    Screenings    Flowsheet Row Most Recent Value  CIWA-Ar Total 11       Medical Decision Making  Observation and FBC admission when a  bed become available    Meds ordered this encounter  Medications   acetaminophen (TYLENOL) tablet 650 mg   alum & mag hydroxide-simeth (MAALOX/MYLANTA) 200-200-20 MG/5ML suspension 30 mL   magnesium hydroxide (MILK OF MAGNESIA) suspension 30 mL   OLANZapine zydis (ZYPREXA) disintegrating tablet 5 mg   OLANZapine (ZYPREXA) injection 5 mg   OLANZapine (ZYPREXA) injection 10 mg   potassium chloride SA (KLOR-CON M) CR tablet 40 mEq   thiamine (VITAMIN B1) injection 100 mg   thiamine (VITAMIN B1) tablet 100 mg   multivitamin with minerals tablet 1 tablet   LORazepam (ATIVAN) tablet 1 mg   hydrOXYzine (ATARAX) tablet 25 mg   loperamide (IMODIUM) capsule 2-4 mg   FOLLOWED BY Linked Order Group     LORazepam (ATIVAN) tablet 1 mg    LORazepam (ATIVAN) tablet 1 mg    LORazepam (ATIVAN) tablet 1 mg    LORazepam (ATIVAN) tablet 1 mg    Lab Orders         SARS Coronavirus 2 by RT PCR (hospital order, performed in [facility] hospital lab) *cepheid single result test* Anterior Nasal Swab         CBC with Differential/Platelet         Comprehensive metabolic panel         Ethanol         TSH         POCT Urine Drug Screen - (I-Screen)     Recommendations  Based on my evaluation the patient does not appear to have an emergency medical condition.  Sindy Guadeloupe, NP 09/30/23  5:37 AM

## 2023-09-29 NOTE — BH Assessment (Signed)
 Comprehensive Clinical Assessment (CCA) Note  09/29/2023 Austin Brown [id number]  Disposition: Per Austin Guadeloupe, NP, patient is recommended for continuous assessment at this time, no available beds on the [facility] unit at this time.   Chief Complaint:  Chief Complaint  Patient presents with   Alcohol Problem   Detox   Visit Diagnosis: Alcohol Use Disorder, Severe Cannabis Use Disorder, Severe  Anxiety Use Disorder  Austin Brown is a 53 year male. Pt. arrived accompanied by his wife, he reporterd that his visit is for alcohol and detox. Pt. reports that he has struggled with alcohol for years and that it is an ongoing issue. Patient started drinking alcohol at the age of 53 years old. He reports daily use and average amount of use is 7-8 malt beers per day. Last use today and he reports drink ing 4-5 malt beers. Additionally, patient reports use of THC starting at a young age. He uses THC "occasionally", average amount of [place] varies, and last use was today, 09/29/2023. Patient has no withdrawal symptoms at present. However, he fears that he will be facing withdrawals soon which usually consist of vomiting, diarrhea, and tremors, agitation, and sweats.  Patient denies SI, HI, and AVH's. Patient reports that he owns a firearm for his own protection. He denies depressive symptoms but does report significant anxiety symptoms, especially when experiencing withdrawal symptoms. His sleep and appetite are both reported as normal. He sleeps 7 to 8 hrs per night. No significant weight gain or loss. Patient does not have a therapist or psychiatrist. No hx of mental health treatment. He has completed residential treatment at [facility] twice and last time was April 2024. Patient lives alone, he is single, and has no children. Currently, he is employed with his families business.     CCA Screening, Triage and Referral (STR)  Patient Reported Information How did you hear about [place]? Family/Friend  What Is  the Reason for Your Visit/Call Today? Austin Navy, NP  How Long Has This Been Causing You Problems? > than 6 months  What Do You Feel Would Help You the Most Today? Alcohol or Drug Use Treatment   Have You Recently Had Any Thoughts About Hurting Yourself? No  Are You Planning to Commit Suicide/Harm Yourself At This time? No   Flowsheet Row ED from 09/29/2023 in [facility] ED to Hosp-Admission (Discharged) from 07/30/2023 in [facility] ED from [facility]/14/2023 in [facility]  C-SSRS RISK CATEGORY Error: Question 6 not populated No Risk No Risk       Have you Recently Had Thoughts About Hurting Someone Austin Brown? No  Are You Planning to Harm Someone at This Time? No  Explanation: Patient denies.  Have You Used Any Alcohol or Drugs in the Past 24 Hours? Yes  How Long Ago Did You Use Drugs or Alcohol? Yes; alcohol; on-going use; last use was today "a few beers"  What Did You Use and How Much? Alcohol; "A few beers"   Do You Currently Have a Therapist/Psychiatrist? Patient denies.  Name of Therapist/Psychiatrist:    Have You Been Recently Discharged From Any Office Practice or Programs? No  Explanation of Discharge From Practice/Program: Patient denies.    CCA Screening Triage Referral Assessment Type of Contact: Face-to-Face  Telemedicine Service Delivery:   Is this Initial or Reassessment?   Date Telepsych consult ordered in [facility]:    Time Telepsych consult ordered in [facility]:  Location of Assessment: [facility] Assessment Services  Provider Location: [facility] Assessment Services   Collateral Involvement: No collateral information received at this time.   Does Patient Have a Automotive engineer Guardian? No  Legal Guardian Contact Information: n/a  Copy of Legal Guardianship Form: No - copy requested  Legal Guardian Notified of Arrival: -- (No legal guardian.)  Legal Guardian  Notified of Pending Discharge: -- (No legal guardian.)  If Minor and Not Living with Parent(s), Who has Custody? N/A  Is CPS involved or ever been involved? Never  Is APS involved or ever been involved? Never   Patient Determined To Be At Risk for Harm To Self or Others Based on Review of Patient Reported Information or Presenting Complaint? No  Method: No Plan  Availability of Means: Has close by (He has access to a gun for protection.)  Intent: Vague intent or NA  Notification Required: No need or identified person  Additional Information for Danger to Others Potential: -- (n/a)  Additional Comments for Danger to Others Potential: n/a  Are There Guns or Other Weapons in Your Home? No  Types of Guns/Weapons: No access to guns or weapons.  Are These Weapons Safely Secured?                            Yes  Who Could Verify You Are Able To Have These Secured: Austin Brown (Mother)  [phone] (Home Phone)  Do You Have any Outstanding Charges, Pending Court Dates, Parole/Probation? Patient denies.  Contacted To Inform of Risk of Harm To Self or Others: -- (No risk to others.)    Does Patient Present under Involuntary Commitment? No    [place] of Residence: [place]   Patient Currently Receiving the Following Services: -- (Patient has no services in place at this time.)   Determination of Need: Urgent (48 hours)   Options For Referral: Medication Management; Facility-Based Crisis; Chemical Dependency Intensive Outpatient Therapy (CDIOP)     CCA Biopsychosocial Patient Reported Schizophrenia/Schizoaffective Diagnosis in Past: No   Strengths: Patient is forthcoming with information and states that he wants to stop drinking.   Mental Health Symptoms Depression:  Fatigue; Difficulty Concentrating   Duration of Depressive symptoms: Duration of Depressive Symptoms: Greater than two weeks   Mania:  None   Anxiety:   Difficulty concentrating   Psychosis:   None   Duration of Psychotic symptoms:    Trauma:  N/A   Obsessions:  N/A   Compulsions:  None   Inattention:  None   Hyperactivity/Impulsivity:  None   Oppositional/Defiant Behaviors:  None   Emotional Irregularity:  None   Other Mood/Personality Symptoms:  cooperative    Mental Status Exam Appearance and self-care  Stature:  Average   Weight:  Average weight   Clothing:  Appropriate   Grooming:  Normal   Cosmetic use:  Age appropriate   Posture/gait:  Normal   Motor activity:  Not Remarkable   Sensorium  Attention:  Normal   Concentration:  Normal   Orientation:  Time; Situation; Place; Person   Recall/memory:  Normal   Affect and Mood  Affect:  Appropriate   Mood:  Anxious   Relating  Eye contact:  None   Facial expression:  Depressed   Attitude toward examiner:  Cooperative   Thought and Language  Speech flow: Clear and Coherent   Thought content:  Appropriate to Mood and Circumstances   Preoccupation:  None   Hallucinations:  None   Organization:  Patent examiner of Knowledge:  Average   Intelligence:  Average   Abstraction:  Normal   Judgement:  Poor   Reality Testing:  Adequate   Insight:  Good   Decision Making:  Normal   Social Functioning  Social Maturity:  Responsible   Social Judgement:  Normal   Stress  Stressors:  Family conflict   Coping Ability:  Normal   Skill Deficits:  Activities of daily living   Supports:  Support needed     Religion: Religion/Spirituality Are You A Religious Person?: No How Might This Affect Treatment?: n/a  Leisure/Recreation: Leisure / Recreation Do You Have Hobbies?: Yes Leisure and Hobbies: Fishing  Exercise/Diet: Exercise/Diet Do You Exercise?: No Have You Gained or Lost A Significant Amount of Weight in the Past Six Months?: No Do You Follow a Special Diet?: No Do You Have Any Trouble Sleeping?: No   CCA  Employment/Education Employment/Work Situation: Employment / Work Situation Employment Situation: Employed Work Stressors: none reported Patient's Job has Been Impacted by Current Illness: No Has Patient ever Been in Equities trader?: No  Education: Education Is Patient Currently Attending School?: No Last Grade Completed:  (some college) Did Theme park manager?: No Did You Have An Individualized Education Program (IIEP): No Did You Have Any Difficulty At [employer]?: No Patient's Education Has Been Impacted by Current Illness: No   CCA Family/Childhood History Family and Relationship History: Family history Marital status: Single Does patient have children?: No  Childhood History:  Childhood History By whom was/is the patient raised?: Both parents Did patient suffer any verbal/emotional/physical/sexual abuse as a child?: No Did patient suffer from severe childhood neglect?: No Has patient ever been sexually abused/assaulted/raped as an adolescent or adult?: No Was the patient ever a victim of a crime or a disaster?: No Witnessed domestic violence?: No Has patient been affected by domestic violence as an adult?: No       CCA Substance Use Alcohol/Drug Use: Alcohol / Drug Use Pain Medications: Xanax Prescriptions: Abuses xanex (per chart review) Over the Counter: n/a History of alcohol / drug use?: Yes Longest period of sobriety (when/how long): 6 months or more Negative Consequences of Use: Personal relationships, Surveyor, quantity, Work / School Substance #1 Name of Substance 1: Alcohol 1 - Age of First Use: 53 years old 1 - Amount (size/oz): 7-8 malt beers 1 - Frequency: daily 1 - Duration: on-going 1 - Last Use / Amount: 09/29/2023; 4-5 malt beers 1 - Method of Aquiring: varies 1- Route of Use: oral Substance #2 Name of Substance 2: THC 2 - Age of First Use: unkown; patient did not provide a response 2 - Amount (size/oz): <1 mg 2 - Frequency: varies 2 - Duration:  on-going 2 - Last Use / Amount: 09/29/2023 2 - Method of Aquiring: varies 2 - Route of Substance Use: smokes                     ASAM's:  Six Dimensions of Multidimensional Assessment  Dimension 1:  Acute Intoxication and/or Withdrawal Potential:      Dimension 2:  Biomedical Conditions and Complications:      Dimension 3:  Emotional, Behavioral, or Cognitive Conditions and Complications:     Dimension 4:  Readiness to Change:     Dimension 5:  Relapse, Continued use, or Continued Problem Potential:     Dimension 6:  Recovery/Living Environment:     ASAM Severity Score:  ASAM Recommended Level of Treatment:     Substance use Disorder (SUD) Substance Use Disorder (SUD)  Checklist Symptoms of Substance Use: Continued use despite having a persistent/recurrent physical/psychological problem caused/exacerbated by use, Continued use despite persistent or recurrent social, interpersonal problems, caused or exacerbated by use, Evidence of tolerance, Evidence of withdrawal (Comment), Large amounts of time spent to obtain, use or recover from the substance(s), Persistent desire or unsuccessful efforts to cut down or control use, Presence of craving or strong urge to use, Recurrent use that results in a failure to fulfill major role obligations (work, school, home), Repeated use in physically hazardous situations, Social, occupational, recreational activities given up or reduced due to use, Substance(s) often taken in larger amounts or over longer times than was intended  Recommendations for Services/Supports/Treatments: Recommendations for Services/Supports/Treatments Recommendations For Services/Supports/Treatments: Insurance claims handler, CD-IOP Intensive Chemical Dependency Program, Medication Management, SAIOP (Substance Abuse Intensive Outpatient Program), Residential-Level 1  Disposition Recommendation per psychiatric provider: We recommend transfer to [facility].   DSM5 Diagnoses: Patient Active Problem List   Diagnosis Date Noted   Alcohol withdrawal (HCC) 07/31/2023   Low blood pressure 07/31/2023   Electrolyte abnormality 07/31/2023   Nicotine dependence 11/07/2021   Hyponatremia 11/06/2021   Alcohol abuse 11/06/2021   Hypokalemia 11/06/2021   Hypomagnesemia 11/06/2021   Essential hypertension 11/06/2021   Mild intermittent asthma without complication 11/06/2021   Prolonged QT interval 11/06/2021     Referrals to Alternative Service(s): Referred to Alternative Service(s):   Place:   Date:   Time:    Referred to Alternative Service(s):   Place:   Date:   Time:    Referred to Alternative Service(s):   Place:   Date:   Time:    Referred to Alternative Service(s):   Place:   Date:   Time:     Melynda Ripple, Counselor

## 2023-09-29 NOTE — Progress Notes (Signed)
   09/29/23 1958  [facility]UC Triage Screening (Walk-ins at [facility] only)  How Did You Hear About [place]? Family/Friend  What Is the Reason for Your Visit/Call Today? Pt. arrived accompanied by his wife, he reporterd that his visit is for alcohol and detox. Pt. reports that he has struggled with alcohol for years and that it is an ongoing issue. Pt. reported that he smoked a gram of marijuana and had about 5 drinks within the last 24 hours. Pt reports no SI, HI, or AVH.  How Long Has This Been Causing You Problems? > than 6 months  Have You Recently Had Any Thoughts About Hurting Yourself? No  Are You Planning to Commit Suicide/Harm Yourself At This time? No  Have you Recently Had Thoughts About Hurting Someone [name]? No  Are You Planning To Harm Someone At This Time? No  Physical Abuse Denies  Verbal Abuse Denies  Sexual Abuse Denies  Exploitation of patient/patient's resources Denies  Self-Neglect Denies  Are you currently experiencing any auditory, visual or other hallucinations? No  Have You Used Any Alcohol or Drugs in the Past 24 Hours? No  Do you have any current medical co-morbidities that require immediate attention? No  Clinician description of patient physical appearance/behavior: Groggy and hungry, calm and cooperative.  What Do You Feel Would Help You the Most Today? Alcohol or Drug Use Treatment  If access to [facility] was not available, would you have sought care in the Emergency Department? Yes  Determination of Need Urgent (48 hours)  Options For Referral Facility-Based Crisis;BH Urgent Care;Chemical Dependency Intensive Outpatient Therapy (CDIOP);Outpatient Therapy;Other: Comment  Determination of Need filed? Yes

## 2023-09-30 ENCOUNTER — Inpatient Hospital Stay (HOSPITAL_COMMUNITY)
Admission: EM | Admit: 2023-09-30 | Discharge: 2023-10-04 | DRG: 897 | Disposition: A | Discharge status: Home / Self Care | Attending: Internal Medicine | Admitting: Internal Medicine

## 2023-09-30 ENCOUNTER — Encounter (HOSPITAL_COMMUNITY): Payer: Self-pay

## 2023-09-30 ENCOUNTER — Other Ambulatory Visit: Payer: Self-pay

## 2023-09-30 DIAGNOSIS — E639 Nutritional deficiency, unspecified: Secondary | ICD-10-CM | POA: Diagnosis present

## 2023-09-30 DIAGNOSIS — E871 Hypo-osmolality and hyponatremia: Secondary | ICD-10-CM | POA: Diagnosis present

## 2023-09-30 DIAGNOSIS — F1010 Alcohol abuse, uncomplicated: Secondary | ICD-10-CM

## 2023-09-30 DIAGNOSIS — F17210 Nicotine dependence, cigarettes, uncomplicated: Secondary | ICD-10-CM | POA: Diagnosis present

## 2023-09-30 DIAGNOSIS — F10930 Alcohol use, unspecified with withdrawal, uncomplicated: Secondary | ICD-10-CM | POA: Diagnosis not present

## 2023-09-30 DIAGNOSIS — G40509 Epileptic seizures related to external causes, not intractable, without status epilepticus: Secondary | ICD-10-CM | POA: Diagnosis present

## 2023-09-30 DIAGNOSIS — D696 Thrombocytopenia, unspecified: Secondary | ICD-10-CM | POA: Diagnosis present

## 2023-09-30 DIAGNOSIS — F10931 Alcohol use, unspecified with withdrawal delirium: Secondary | ICD-10-CM | POA: Diagnosis not present

## 2023-09-30 DIAGNOSIS — F10939 Alcohol use, unspecified with withdrawal, unspecified: Secondary | ICD-10-CM | POA: Diagnosis present

## 2023-09-30 DIAGNOSIS — E876 Hypokalemia: Secondary | ICD-10-CM | POA: Diagnosis present

## 2023-09-30 DIAGNOSIS — Z9181 History of falling: Secondary | ICD-10-CM | POA: Diagnosis not present

## 2023-09-30 DIAGNOSIS — F10229 Alcohol dependence with intoxication, unspecified: Secondary | ICD-10-CM | POA: Diagnosis present

## 2023-09-30 DIAGNOSIS — Z7984 Long term (current) use of oral hypoglycemic drugs: Secondary | ICD-10-CM | POA: Diagnosis not present

## 2023-09-30 DIAGNOSIS — I10 Essential (primary) hypertension: Secondary | ICD-10-CM | POA: Diagnosis present

## 2023-09-30 DIAGNOSIS — Z880 Allergy status to penicillin: Secondary | ICD-10-CM

## 2023-09-30 DIAGNOSIS — Y907 Blood alcohol level of 200-239 mg/100 ml: Secondary | ICD-10-CM | POA: Diagnosis present

## 2023-09-30 DIAGNOSIS — J45909 Unspecified asthma, uncomplicated: Secondary | ICD-10-CM | POA: Diagnosis present

## 2023-09-30 DIAGNOSIS — F10239 Alcohol dependence with withdrawal, unspecified: Principal | ICD-10-CM | POA: Diagnosis present

## 2023-09-30 DIAGNOSIS — R569 Unspecified convulsions: Secondary | ICD-10-CM | POA: Diagnosis not present

## 2023-09-30 DIAGNOSIS — Z79899 Other long term (current) drug therapy: Secondary | ICD-10-CM

## 2023-09-30 DIAGNOSIS — R4589 Other symptoms and signs involving emotional state: Secondary | ICD-10-CM

## 2023-09-30 DIAGNOSIS — F1210 Cannabis abuse, uncomplicated: Secondary | ICD-10-CM | POA: Diagnosis present

## 2023-09-30 LAB — CBC WITH DIFFERENTIAL/PLATELET
Abs Immature Granulocytes: 0.02 10*3/uL (ref 0.00–0.07)
Abs Immature Granulocytes: 0.02 10*3/uL (ref 0.00–0.07)
Basophils Absolute: 0 10*3/uL (ref 0.0–0.1)
Basophils Absolute: 0 10*3/uL (ref 0.0–0.1)
Basophils Relative: 1 %
Basophils Relative: 1 %
Eosinophils Absolute: 0.1 10*3/uL (ref 0.0–0.5)
Eosinophils Absolute: 0.1 10*3/uL (ref 0.0–0.5)
Eosinophils Relative: 1 %
Eosinophils Relative: 1 %
HCT: 38.5 % — ABNORMAL LOW (ref 39.0–52.0)
HCT: 41.4 % (ref 39.0–52.0)
Hemoglobin: 14.3 g/dL (ref 13.0–17.0)
Hemoglobin: 14.9 g/dL (ref 13.0–17.0)
Immature Granulocytes: 0 %
Immature Granulocytes: 0 %
Lymphocytes Relative: 21 %
Lymphocytes Relative: 17 %
Lymphs Abs: 1.4 10*3/uL (ref 0.7–4.0)
Lymphs Abs: 0.7 10*3/uL (ref 0.7–4.0)
MCH: 36.6 pg — ABNORMAL HIGH (ref 26.0–34.0)
MCH: 36.1 pg — ABNORMAL HIGH (ref 26.0–34.0)
MCHC: 37.1 g/dL — ABNORMAL HIGH (ref 30.0–36.0)
MCHC: 36 g/dL (ref 30.0–36.0)
MCV: 98.5 fL (ref 80.0–100.0)
MCV: 100.2 fL — ABNORMAL HIGH (ref 80.0–100.0)
Monocytes Absolute: 1 10*3/uL (ref 0.1–1.0)
Monocytes Absolute: 0.8 10*3/uL (ref 0.1–1.0)
Monocytes Relative: 16 %
Monocytes Relative: 17 %
Neutro Abs: 3.9 10*3/uL (ref 1.7–7.7)
Neutro Abs: 2.9 10*3/uL (ref 1.7–7.7)
Neutrophils Relative %: 61 %
Neutrophils Relative %: 64 %
Platelets: 126 10*3/uL — ABNORMAL LOW (ref 150–400)
Platelets: 103 10*3/uL — ABNORMAL LOW (ref 150–400)
RBC: 3.91 MIL/uL — ABNORMAL LOW (ref 4.22–5.81)
RBC: 4.13 MIL/uL — ABNORMAL LOW (ref 4.22–5.81)
RDW: 13.2 % (ref 11.5–15.5)
RDW: 13.1 % (ref 11.5–15.5)
WBC: 6.4 10*3/uL (ref 4.0–10.5)
WBC: 4.5 10*3/uL (ref 4.0–10.5)
nRBC: 0 % (ref 0.0–0.2)
nRBC: 0 % (ref 0.0–0.2)

## 2023-09-30 LAB — LIPID PANEL
Cholesterol: 220 mg/dL — ABNORMAL HIGH (ref 0–200)
HDL: 57 mg/dL (ref >40)
LDL Cholesterol: 140 mg/dL — ABNORMAL HIGH (ref 0–99)
Total CHOL/HDL Ratio: 3.9 ratio
Triglycerides: 115 mg/dL (ref <150)
VLDL: 23 mg/dL (ref 0–40)

## 2023-09-30 LAB — COMPREHENSIVE METABOLIC PANEL
ALT: 25 U/L (ref 0–44)
ALT: 25 U/L (ref 0–44)
AST: 79 U/L — ABNORMAL HIGH (ref 15–41)
AST: 78 U/L — ABNORMAL HIGH (ref 15–41)
Albumin: 3.6 g/dL (ref 3.5–5.0)
Albumin: 4.1 g/dL (ref 3.5–5.0)
Alkaline Phosphatase: 85 U/L (ref 38–126)
Alkaline Phosphatase: 95 U/L (ref 38–126)
Anion gap: 20 — ABNORMAL HIGH (ref 5–15)
Anion gap: 13 (ref 5–15)
BUN: <5 mg/dL — ABNORMAL LOW (ref 6–20)
BUN: 9 mg/dL (ref 6–20)
CO2: 30 mmol/L (ref 22–32)
CO2: 34 mmol/L — ABNORMAL HIGH (ref 22–32)
Calcium: 8.7 mg/dL — ABNORMAL LOW (ref 8.9–10.3)
Calcium: 8.7 mg/dL — ABNORMAL LOW (ref 8.9–10.3)
Chloride: 79 mmol/L — ABNORMAL LOW (ref 98–111)
Chloride: 84 mmol/L — ABNORMAL LOW (ref 98–111)
Creatinine, Ser: 0.76 mg/dL (ref 0.61–1.24)
Creatinine, Ser: 0.64 mg/dL (ref 0.61–1.24)
GFR, Estimated: >60 mL/min (ref >60)
GFR, Estimated: >60 mL/min (ref >60)
Glucose, Bld: 84 mg/dL (ref 70–99)
Glucose, Bld: 115 mg/dL — ABNORMAL HIGH (ref 70–99)
Potassium: 2.1 mmol/L — CL (ref 3.5–5.1)
Potassium: 2.6 mmol/L — CL (ref 3.5–5.1)
Sodium: 129 mmol/L — ABNORMAL LOW (ref 135–145)
Sodium: 131 mmol/L — ABNORMAL LOW (ref 135–145)
Total Bilirubin: 1.4 mg/dL — ABNORMAL HIGH (ref 0.0–1.2)
Total Bilirubin: 3.1 mg/dL — ABNORMAL HIGH (ref 0.0–1.2)
Total Protein: 6.3 g/dL — ABNORMAL LOW (ref 6.5–8.1)
Total Protein: 7.3 g/dL (ref 6.5–8.1)

## 2023-09-30 LAB — BASIC METABOLIC PANEL
Anion gap: 19 — ABNORMAL HIGH (ref 5–15)
BUN: 5 mg/dL — ABNORMAL LOW (ref 6–20)
CO2: 33 mmol/L — ABNORMAL HIGH (ref 22–32)
Calcium: 9.1 mg/dL (ref 8.9–10.3)
Chloride: 82 mmol/L — ABNORMAL LOW (ref 98–111)
Creatinine, Ser: 0.73 mg/dL (ref 0.61–1.24)
GFR, Estimated: >60 mL/min (ref >60)
Glucose, Bld: 103 mg/dL — ABNORMAL HIGH (ref 70–99)
Potassium: 2.3 mmol/L — CL (ref 3.5–5.1)
Sodium: 134 mmol/L — ABNORMAL LOW (ref 135–145)

## 2023-09-30 LAB — HEMOGLOBIN A1C
Hgb A1c MFr Bld: 4.9 % (ref 4.8–5.6)
Mean Plasma Glucose: 93.93 mg/dL

## 2023-09-30 LAB — ETHANOL: Alcohol, Ethyl (B): 205 mg/dL — ABNORMAL HIGH (ref <10)

## 2023-09-30 LAB — MAGNESIUM: Magnesium: 1.4 mg/dL — ABNORMAL LOW (ref 1.7–2.4)

## 2023-09-30 LAB — TSH: TSH: 1.743 u[IU]/mL (ref 0.350–4.500)

## 2023-09-30 MED ORDER — LORAZEPAM 1 MG PO TABS: 1.0000 mg | ORAL_TABLET | Freq: Three times a day (TID) | ORAL | Status: DC

## 2023-09-30 MED ORDER — LORAZEPAM 1 MG PO TABS: 1.0000 mg | ORAL_TABLET | Freq: Every day | ORAL | Status: DC

## 2023-09-30 MED ORDER — HYDROXYZINE HCL 25 MG PO TABS: 25.0000 mg | ORAL_TABLET | Freq: Four times a day (QID) | ORAL | Status: DC | PRN

## 2023-09-30 MED ORDER — THIAMINE MONONITRATE 100 MG PO TABS: 100.0000 mg | ORAL_TABLET | Freq: Every day | ORAL | Status: DC

## 2023-09-30 MED ORDER — FLUOXETINE HCL 20 MG PO CAPS
40.0000 mg | ORAL_CAPSULE | Freq: Every day | ORAL | Status: DC
  Administered 2023-09-30 – 2023-10-04 (×5): 40 mg via ORAL
  Filled 2023-09-30 (×5): qty 2

## 2023-09-30 MED ORDER — ACETAMINOPHEN 325 MG PO TABS: 650.0000 mg | ORAL_TABLET | Freq: Four times a day (QID) | ORAL | Status: DC | PRN

## 2023-09-30 MED ORDER — TRAZODONE HCL 50 MG PO TABS: 50.0000 mg | ORAL_TABLET | Freq: Every evening | ORAL | Status: DC | PRN

## 2023-09-30 MED ORDER — THIAMINE HCL 100 MG/ML IJ SOLN
100.0000 mg | Freq: Every day | INTRAMUSCULAR | Status: DC
  Administered 2023-09-30: 100 mg via INTRAVENOUS
  Filled 2023-09-30: qty 2

## 2023-09-30 MED ORDER — LORAZEPAM 2 MG/ML IJ SOLN: 0.0000 mg | Freq: Two times a day (BID) | INTRAMUSCULAR | Status: DC

## 2023-09-30 MED ORDER — FLUTICASONE PROPIONATE 50 MCG/ACT NA SUSP: 2.0000 | Freq: Every day | NASAL | Status: DC | PRN

## 2023-09-30 MED ORDER — LORAZEPAM 1 MG PO TABS
1.0000 mg | ORAL_TABLET | Freq: Four times a day (QID) | ORAL | Status: DC | PRN
  Administered 2023-09-30 (×2): 1 mg via ORAL
  Filled 2023-09-30 (×2): qty 1

## 2023-09-30 MED ORDER — THIAMINE HCL 100 MG/ML IJ SOLN
100.0000 mg | Freq: Once | INTRAMUSCULAR | Status: AC
  Administered 2023-09-30: 100 mg via INTRAMUSCULAR
  Filled 2023-09-30: qty 2

## 2023-09-30 MED ORDER — ADULT MULTIVITAMIN W/MINERALS CH
1.0000 | ORAL_TABLET | Freq: Every day | ORAL | Status: DC
  Administered 2023-09-30 – 2023-10-04 (×5): 1 via ORAL
  Filled 2023-09-30 (×5): qty 1

## 2023-09-30 MED ORDER — POTASSIUM CHLORIDE 10 MEQ/100ML IV SOLN
10.0000 meq | INTRAVENOUS | Status: AC
  Administered 2023-09-30 (×5): 10 meq via INTRAVENOUS
  Filled 2023-09-30 (×5): qty 100

## 2023-09-30 MED ORDER — LORAZEPAM 1 MG PO TABS
1.0000 mg | ORAL_TABLET | Freq: Four times a day (QID) | ORAL | Status: DC
  Administered 2023-09-30: 1 mg via ORAL
  Filled 2023-09-30: qty 1

## 2023-09-30 MED ORDER — LORAZEPAM 1 MG PO TABS
0.0000 mg | ORAL_TABLET | Freq: Two times a day (BID) | ORAL | Status: DC
  Administered 2023-10-03 (×2): 1 mg via ORAL
  Filled 2023-09-30 (×2): qty 2
  Filled 2023-09-30: qty 1

## 2023-09-30 MED ORDER — ALBUTEROL SULFATE (2.5 MG/3ML) 0.083% IN NEBU: 2.5000 mg | INHALATION_SOLUTION | RESPIRATORY_TRACT | Status: DC | PRN

## 2023-09-30 MED ORDER — LOPERAMIDE HCL 2 MG PO CAPS
2.0000 mg | ORAL_CAPSULE | ORAL | Status: AC | PRN
  Administered 2023-10-02: 4 mg via ORAL
  Filled 2023-09-30: qty 2

## 2023-09-30 MED ORDER — CARVEDILOL 6.25 MG PO TABS
6.2500 mg | ORAL_TABLET | Freq: Two times a day (BID) | ORAL | Status: DC
  Administered 2023-09-30 – 2023-10-04 (×9): 6.25 mg via ORAL
  Filled 2023-09-30: qty 2
  Filled 2023-09-30: qty 1
  Filled 2023-09-30: qty 2
  Filled 2023-09-30 (×4): qty 1
  Filled 2023-09-30: qty 2
  Filled 2023-09-30: qty 1

## 2023-09-30 MED ORDER — POTASSIUM CHLORIDE CRYS ER 20 MEQ PO TBCR
40.0000 meq | EXTENDED_RELEASE_TABLET | Freq: Once | ORAL | Status: AC
  Administered 2023-09-30: 40 meq via ORAL
  Filled 2023-09-30: qty 2

## 2023-09-30 MED ORDER — LOPERAMIDE HCL 2 MG PO CAPS
2.0000 mg | ORAL_CAPSULE | ORAL | Status: DC | PRN
Start: 2023-09-30 — End: 2023-09-30

## 2023-09-30 MED ORDER — CHLORDIAZEPOXIDE HCL 25 MG PO CAPS
25.0000 mg | ORAL_CAPSULE | Freq: Every day | ORAL | Status: AC
Start: 2023-10-03 — End: 2023-10-03
  Administered 2023-10-03: 25 mg via ORAL
  Filled 2023-09-30: qty 1

## 2023-09-30 MED ORDER — ADULT MULTIVITAMIN W/MINERALS CH
1.0000 | ORAL_TABLET | Freq: Every day | ORAL | Status: DC
  Administered 2023-09-30: 1 via ORAL
  Filled 2023-09-30: qty 1

## 2023-09-30 MED ORDER — POTASSIUM CHLORIDE 20 MEQ PO PACK
60.0000 meq | PACK | Freq: Two times a day (BID) | ORAL | Status: DC
  Filled 2023-09-30: qty 3

## 2023-09-30 MED ORDER — LORAZEPAM 1 MG PO TABS: 1.0000 mg | ORAL_TABLET | Freq: Two times a day (BID) | ORAL | Status: DC

## 2023-09-30 MED ORDER — LORAZEPAM 1 MG PO TABS
1.0000 mg | ORAL_TABLET | ORAL | Status: AC | PRN
  Administered 2023-10-02: 3 mg via ORAL
  Administered 2023-10-02: 2 mg via ORAL
  Filled 2023-09-30: qty 3

## 2023-09-30 MED ORDER — POTASSIUM CHLORIDE 20 MEQ PO PACK
60.0000 meq | PACK | Freq: Once | ORAL | Status: AC
Start: 2023-09-30 — End: 2023-09-30
  Administered 2023-09-30: 60 meq via ORAL

## 2023-09-30 MED ORDER — MAGNESIUM SULFATE 2 GM/50ML IV SOLN
2.0000 g | Freq: Once | INTRAVENOUS | Status: AC
  Administered 2023-09-30: 2 g via INTRAVENOUS
  Filled 2023-09-30: qty 50

## 2023-09-30 MED ORDER — THIAMINE MONONITRATE 100 MG PO TABS
100.0000 mg | ORAL_TABLET | Freq: Every day | ORAL | Status: DC
Start: 2023-09-30 — End: 2023-09-30

## 2023-09-30 MED ORDER — LORAZEPAM 1 MG PO TABS: 0.0000 mg | ORAL_TABLET | Freq: Four times a day (QID) | ORAL | Status: DC

## 2023-09-30 MED ORDER — THIAMINE MONONITRATE 100 MG PO TABS
100.0000 mg | ORAL_TABLET | Freq: Every day | ORAL | Status: DC
  Administered 2023-10-01 – 2023-10-04 (×4): 100 mg via ORAL
  Filled 2023-09-30 (×4): qty 1

## 2023-09-30 MED ORDER — LORAZEPAM 2 MG/ML IJ SOLN
0.0000 mg | Freq: Four times a day (QID) | INTRAMUSCULAR | Status: DC
  Administered 2023-09-30: 1 mg via INTRAVENOUS
  Administered 2023-10-01: 2 mg via INTRAVENOUS
  Administered 2023-10-01: 1 mg via INTRAVENOUS
  Filled 2023-09-30 (×3): qty 1

## 2023-09-30 MED ORDER — THIAMINE HCL 100 MG/ML IJ SOLN: 100.0000 mg | Freq: Every day | INTRAMUSCULAR | Status: DC

## 2023-09-30 MED ORDER — CHLORDIAZEPOXIDE HCL 25 MG PO CAPS
25.0000 mg | ORAL_CAPSULE | Freq: Four times a day (QID) | ORAL | Status: AC
Start: 2023-09-30 — End: 2023-10-01
  Administered 2023-09-30 – 2023-10-01 (×4): 25 mg via ORAL
  Filled 2023-09-30 (×4): qty 1

## 2023-09-30 MED ORDER — CHLORDIAZEPOXIDE HCL 25 MG PO CAPS
25.0000 mg | ORAL_CAPSULE | ORAL | Status: AC
Start: 2023-10-02 — End: 2023-10-03
  Administered 2023-10-02 – 2023-10-03 (×2): 25 mg via ORAL
  Filled 2023-09-30 (×2): qty 1

## 2023-09-30 MED ORDER — HYDROXYZINE HCL 25 MG PO TABS
25.0000 mg | ORAL_TABLET | Freq: Four times a day (QID) | ORAL | Status: AC | PRN
  Administered 2023-10-02 – 2023-10-03 (×2): 25 mg via ORAL
  Filled 2023-09-30 (×2): qty 1

## 2023-09-30 MED ORDER — ENOXAPARIN SODIUM 40 MG/0.4ML IJ SOSY
40.0000 mg | PREFILLED_SYRINGE | INTRAMUSCULAR | Status: DC
  Administered 2023-09-30 – 2023-10-03 (×4): 40 mg via SUBCUTANEOUS
  Filled 2023-09-30 (×4): qty 0.4

## 2023-09-30 MED ORDER — FOLIC ACID 1 MG PO TABS
1.0000 mg | ORAL_TABLET | Freq: Every day | ORAL | Status: DC
  Administered 2023-09-30 – 2023-10-04 (×5): 1 mg via ORAL
  Filled 2023-09-30 (×5): qty 1

## 2023-09-30 MED ORDER — CHLORDIAZEPOXIDE HCL 25 MG PO CAPS
25.0000 mg | ORAL_CAPSULE | Freq: Three times a day (TID) | ORAL | Status: AC
  Administered 2023-10-01 – 2023-10-02 (×3): 25 mg via ORAL
  Filled 2023-09-30 (×3): qty 1

## 2023-09-30 MED ORDER — ACETAMINOPHEN 650 MG RE SUPP: 650.0000 mg | Freq: Four times a day (QID) | RECTAL | Status: DC | PRN

## 2023-09-30 NOTE — H&P (Signed)
 History and Physical  Austin Brown ZDG:[id number] DOB: [birth year] DOA: 09/30/2023  PCP: Merri Brunette, MD   Chief Complaint: Hypokalemia  HPI: Austin Brown is a 53 y.o. male with medical history significant for asthma, hypertension, alcohol abuse and prior hospitalization for alcohol withdrawal being admitted to the hospital with hypokalemia and concern for alcohol withdrawal symptoms.  He was recently hospitalized in January 2025 at this facility, due to alcohol withdrawal and multiple electrolyte abnormalities.  Patient states that he had withdrawal seizures during this hospitalization, but I do not see this documented.  Today he was brought by EMS from behavioral health urgent care, where he had presented requesting voluntary treatment for alcohol intoxication.  He reports that his last drink was 24 hours ago, on 09/29/2023.  Workup at behavioral health urgent care showed hypokalemia, for which she was brought to the ER for treatment.  Currently the patient is resting comfortably, states that he has intermittent diarrhea, and a couple of days ago had vomiting.  He denies any fevers, chest pain, hematemesis, or blood in his stool.  Denies abdominal pain.  Typically drinks about 7 or 8 mixed drinks per day.  Review of Systems: Please see HPI for pertinent positives and negatives. A complete 10 system review of systems are otherwise negative.  Past Medical History:  Diagnosis Date   Asthma    Hypertension    Past Surgical History:  Procedure Laterality Date   JOINT REPLACEMENT     Social History:  reports that he has been smoking cigarettes. He uses smokeless tobacco. He reports current alcohol use of about 8.0 standard drinks of alcohol per week. He reports that he does not use drugs.  Allergies  Allergen Reactions   Amoxicillin Other (See Comments)    unknown    Family History  Problem Relation Age of Onset   Heart disease Neg Hx      Prior to Admission medications    Medication Sig Start Date End Date Taking? Authorizing Provider  albuterol (VENTOLIN HFA) 108 (90 Base) MCG/ACT inhaler Inhale 2 puffs into the lungs every 6 (six) hours as needed for wheezing or shortness of breath. 05/31/21   [provider]  Brimonidine Tartrate (LUMIFY OP) Place 1 drop into both eyes daily.    [provider]  carvedilol (COREG) 6.25 MG tablet Take 6.25 mg by mouth 2 (two) times daily. 07/27/23   [provider]  famotidine (PEPCID) 20 MG tablet Take 20 mg by mouth daily. 02/12/23   [provider]  feeding supplement (ENSURE ENLIVE / ENSURE PLUS) LIQD Take 237 mLs by mouth 2 (two) times daily between meals. 08/04/23   Sheikh, Omair Latif, DO  fexofenadine (ALLEGRA) 180 MG tablet Take 180 mg by mouth daily as needed for allergies or rhinitis.    [provider]  FLUoxetine (PROZAC) 40 MG capsule Take 40 mg by mouth daily. 01/06/23   [provider]  fluticasone (FLONASE) 50 MCG/ACT nasal spray Place 2 sprays into both nostrils daily. 2 sprays each nostril at night Patient taking differently: Place 2 sprays into both nostrils at bedtime. 03/06/21   Drema Halon, MD  folic acid (FOLVITE) 1 MG tablet Take 1 tablet (1 mg total) by mouth daily. 11/10/21   Regalado, Belkys A, MD  metFORMIN (GLUCOPHAGE) 500 MG tablet Take 500 mg by mouth daily. 03/31/23   [provider]  Multiple Vitamin (MULTIVITAMIN WITH MINERALS) TABS tablet Take 1 tablet by mouth daily. 08/05/23   Marguerita Merles  Latif, DO  naltrexone (DEPADE) 50 MG tablet Take 50 mg by mouth daily. 01/07/23   [provider]  nicotine (NICODERM CQ - DOSED IN MG/24 HOURS) 21 mg/24hr patch Place 1 patch (21 mg total) onto the skin daily. Patient not taking: Reported on 07/31/2023 11/10/21   Hartley Barefoot A, MD  Potassium Chloride ER 20 MEQ TBCR Take 1 tablet by mouth daily. 03/31/23   [provider]  thiamine 100 MG tablet Take 1 tablet (100 mg total)  by mouth daily. 11/10/21   Regalado, Belkys A, MD  traZODone (DESYREL) 100 MG tablet Take 100 mg by mouth at bedtime as needed for sleep. 10/14/21   [provider]    Physical Exam: BP (!) 136/101   Pulse 99   Temp 97.7 F (36.5 C) (Oral)   Resp 18   SpO2 97%  General: Sleeping heavily on my arrival, however once awakened he is awake alert oriented.  Pleasant and cooperative, no tremor or anxiety. Cardiovascular: Tachycardic and regular, no murmurs or rubs, no peripheral edema  Respiratory: clear to auscultation bilaterally, no wheezes, no crackles  Abdomen: soft, nontender, nondistended, normal bowel tones heard  Skin: dry, no rashes  Musculoskeletal: no joint effusions, normal range of motion  Psychiatric: appropriate affect, normal speech  Neurologic: extraocular muscles intact, clear speech, moving all extremities with intact sensorium         Labs on Admission:  Basic Metabolic Panel: Recent Labs  Lab 09/29/23 2155 09/30/23 1005  NA 129* 134*  K 2.1* 2.3*  CL 79* 82*  CO2 30 33*  GLUCOSE 84 103*  BUN <5* 5*  CREATININE 0.76 0.73  CALCIUM 8.7* 9.1   Liver Function Tests: Recent Labs  Lab 09/29/23 2155  AST 79*  ALT 25  ALKPHOS 85  BILITOT 1.4*  PROT 6.3*  ALBUMIN 3.6   No results for input(s): "LIPASE", "AMYLASE" in the last 168 hours. No results for input(s): "AMMONIA" in the last 168 hours. CBC: Recent Labs  Lab 09/29/23 2155  WBC 6.4  NEUTROABS 3.9  HGB 14.3  HCT 38.5*  MCV 98.5  PLT 126*   Cardiac Enzymes: No results for input(s): "CKTOTAL", "CKMB", "CKMBINDEX", "TROPONINI" in the last 168 hours. BNP (last 3 results) No results for input(s): "BNP" in the last 8760 hours.  ProBNP (last 3 results) No results for input(s): "PROBNP" in the last 8760 hours.  CBG: No results for input(s): "GLUCAP" in the last 168 hours.  Radiological Exams on Admission: No results found.  Assessment/Plan Austin Brown is a 54 y.o. male with medical  history significant for asthma, hypertension, alcohol abuse and prior hospitalization for alcohol withdrawal being admitted to the hospital with electrolyte derangements and concern for impending alcohol withdrawal.  Alcohol withdrawal-patient with prior hospitalization due to alcohol withdrawal, states that he has had withdrawal seizures in the past.  Last drink of alcohol was 09/29/2023.  While he was admitted to the hospital purely for hypokalemia, I feel he is at very high risk for developing severe alcohol withdrawal if not treated proactively. -Inpatient admission to progressive -Thiamine, folate, multivitamin -Librium taper -P.o. Ativan every hour per CIWA score  Hypokalemia-likely due to nutritional deficiency, as well as chronic intermittent diarrhea. -Ordered potassium chloride p.o. 60 mEq x 2 doses -Ordered IV potassium repletion 50 mEq total -Recheck potassium level today at 9 PM -Check magnesium  Hypertension-continue home dose Coreg  Thrombocytopenia-likely due to bone marrow suppression from alcohol abuse, monitor daily  DVT prophylaxis: Lovenox  Code Status: Full Code  Consults called: None  Admission status: The appropriate patient status for this patient is INPATIENT. Inpatient status is judged to be reasonable and necessary in order to provide the required intensity of service to ensure the patient's safety. The patient's presenting symptoms, physical exam findings, and initial radiographic and laboratory data in the context of their chronic comorbidities is felt to place them at high risk for further clinical deterioration. Furthermore, it is not anticipated that the patient will be medically stable for discharge from the hospital within 2 midnights of admission.    I certify that at the point of admission it is my clinical judgment that the patient will require inpatient hospital care spanning beyond 2 midnights from the point of admission due to high intensity of  service, high risk for further deterioration and high frequency of surveillance required  Time spent: 56 minutes  Willer Osorno Sharlette Dense MD [facility] Pager [phone]  If 7PM-7AM, please contact night-coverage [email or web address] Password [facility]  09/30/2023, 2:46 PM

## 2023-09-30 NOTE — Discharge Instructions (Signed)
 Transferring to the ED for potassium replacement critical level 2.3

## 2023-09-30 NOTE — ED Notes (Addendum)
 Patient awake and alert at change of shift. Denies si/hi/avh. he is here for alcohol addiction and detox. His [place] at 530am is 12. At present he presents with hand tremors, nasal congestion and some generalized body aches at 5/10 on pain scale. Tylenol 650mg  po given for pain and effective new order for EKG and repeat labs related to low potasium level. 40 meq potassium also ordered stat. Denies feeling chest pain. He declined breakfast,and at current he is laying down and resting without complaint. Provider updated on patient condition. Will continue to monitor and report changes as noted. Safety maintained.

## 2023-09-30 NOTE — ED Notes (Signed)
 Patient is resting in recliner bed with eyes closed without any distress noted. Staff will continue to monitor for safety per protocol and for changes in condition.

## 2023-09-30 NOTE — ED Provider Notes (Signed)
 Patient sent via EMS to [facility] emergency department due to a critical potassium level of 2.3.  Patient was admitted overnight and had a critical potassium level of 2.1.  Patient received 2 doses of oral potassium 40 mEq each.  Potassium level only improved to 2.3. Accepting provider Dr. Shirlee Limerick.  Advised once potassium is replaced and patient is medically cleared can return to facility as patient is pending acceptance to [facility].

## 2023-09-30 NOTE — ED Notes (Signed)
 EMS called for transport to [facility] due to critical K+ lab value of 2.3

## 2023-09-30 NOTE — ED Triage Notes (Addendum)
 Pt BIB EMS from [facility] due to abnormal lab values. Pt Potassium was 2.3; pt given potassium at facility before transport. Pt was at [facility] voluntarily to get treatment for Alcohol Intoxication. Pt is currently experiencing withdrawal symptoms; reports last drink 24 hours ago. Hx of seizure during withdrawals 2 months ago.  In [address] ga IV L Hand CBG 129

## 2023-09-30 NOTE — ED Notes (Signed)
 Provided patient with beverage and ice as requested by patient

## 2023-09-30 NOTE — ED Notes (Addendum)
 [facility] called to report a critical lab value of a 2.3 K. Provider Joaquin Courts, NP made aware.

## 2023-09-30 NOTE — ED Provider Notes (Signed)
 [place] EMERGENCY DEPARTMENT AT [facility] Provider Note   CSN: [id number] Arrival date & time: 09/30/23  1316     History  Chief Complaint  Patient presents with   Abnormal Labs   Alcohol Intoxication    Austin Brown is a 53 y.o. male.  Patient presents from behavioral health urgent care for concern of hypokalemia.  Yesterday his potassium was 2.1 today after 2 doses of p.o. potassium supplement and increased to 2.3.  He initially went to behavioral health urgent care for concern of excessive drinking and wanting treatment.  He states he typically drank heavily daily and his last drink was about 24 hours ago.  He denies other complaints.  He does not feel currently like he is having withdrawal symptoms.  The history is provided by the patient. No language interpreter was used.       Home Medications Prior to Admission medications   Medication Sig Start Date End Date Taking? Authorizing Provider  albuterol (VENTOLIN HFA) 108 (90 Base) MCG/ACT inhaler Inhale 2 puffs into the lungs every 6 (six) hours as needed for wheezing or shortness of breath. 05/31/21   [provider]  Brimonidine Tartrate (LUMIFY OP) Place 1 drop into both eyes daily.    [provider]  carvedilol (COREG) 6.25 MG tablet Take 6.25 mg by mouth 2 (two) times daily. 07/27/23   [provider]  famotidine (PEPCID) 20 MG tablet Take 20 mg by mouth daily. 02/12/23   [provider]  feeding supplement (ENSURE ENLIVE / ENSURE PLUS) LIQD Take 237 mLs by mouth 2 (two) times daily between meals. 08/04/23   Sheikh, Omair Latif, DO  fexofenadine (ALLEGRA) 180 MG tablet Take 180 mg by mouth daily as needed for allergies or rhinitis.    [provider]  FLUoxetine (PROZAC) 40 MG capsule Take 40 mg by mouth daily. 01/06/23   [provider]  fluticasone (FLONASE) 50 MCG/ACT nasal spray Place 2 sprays into both nostrils daily. 2 sprays each nostril at  night Patient taking differently: Place 2 sprays into both nostrils at bedtime. 03/06/21   Drema Halon, MD  folic acid (FOLVITE) 1 MG tablet Take 1 tablet (1 mg total) by mouth daily. 11/10/21   Regalado, Belkys A, MD  metFORMIN (GLUCOPHAGE) 500 MG tablet Take 500 mg by mouth daily. 03/31/23   [provider]  Multiple Vitamin (MULTIVITAMIN WITH MINERALS) TABS tablet Take 1 tablet by mouth daily. 08/05/23   Marguerita Merles Latif, DO  naltrexone (DEPADE) 50 MG tablet Take 50 mg by mouth daily. 01/07/23   [provider]  nicotine (NICODERM CQ - DOSED IN MG/24 HOURS) 21 mg/24hr patch Place 1 patch (21 mg total) onto the skin daily. Patient not taking: Reported on 07/31/2023 11/10/21   Hartley Barefoot A, MD  Potassium Chloride ER 20 MEQ TBCR Take 1 tablet by mouth daily. 03/31/23   [provider]  thiamine 100 MG tablet Take 1 tablet (100 mg total) by mouth daily. 11/10/21   Regalado, Belkys A, MD  traZODone (DESYREL) 100 MG tablet Take 100 mg by mouth at bedtime as needed for sleep. 10/14/21   [provider]      Allergies    Amoxicillin    Review of Systems   Review of Systems  Constitutional:  Negative for chills and fever.  Respiratory:  Negative for shortness of breath.   Cardiovascular:  Negative for chest pain.  Neurological:  Negative for light-headedness.  All other systems  reviewed and are negative.   Physical Exam Updated Vital Signs BP (!) 146/91   Pulse (!) 105   Temp 97.7 F (36.5 C) (Oral)   Resp 18   SpO2 97%  Physical Exam Vitals and nursing note reviewed.  Constitutional:      General: He is not in acute distress.    Appearance: Normal appearance. He is not ill-appearing.  HENT:     Head: Normocephalic and atraumatic.     Nose: Nose normal.  Eyes:     General: No scleral icterus.    Extraocular Movements: Extraocular movements intact.     Conjunctiva/sclera: Conjunctivae normal.  Cardiovascular:     Rate and Rhythm:  Normal rate and regular rhythm.  Pulmonary:     Effort: Pulmonary effort is normal. No respiratory distress.     Breath sounds: Normal breath sounds. No wheezing or rales.  Abdominal:     General: There is no distension.     Palpations: Abdomen is soft.     Tenderness: There is no abdominal tenderness. There is no guarding.  Musculoskeletal:        General: Normal range of motion.     Cervical back: Normal range of motion.  Skin:    General: Skin is warm and dry.  Neurological:     General: No focal deficit present.     Mental Status: He is alert. Mental status is at baseline.     ED Results / Procedures / Treatments   Labs (all labs ordered are listed, but only abnormal results are displayed) Labs Reviewed  CBC WITH DIFFERENTIAL/PLATELET  COMPREHENSIVE METABOLIC PANEL    EKG None  Radiology No results found.  Procedures .Critical Care  Performed by: Marita Kansas, PA-C Authorized by: Marita Kansas, PA-C   Critical care provider statement:    Critical care time (minutes):  30   Critical care was necessary to treat or prevent imminent or life-threatening deterioration of the following conditions:  Metabolic crisis   Critical care was time spent personally by me on the following activities:  Development of treatment plan with patient or surrogate, discussions with consultants, evaluation of patient's response to treatment, examination of patient, ordering and review of laboratory studies, ordering and review of radiographic studies, ordering and performing treatments and interventions, pulse oximetry, re-evaluation of patient's condition and review of old charts   Care discussed with: admitting provider       Medications Ordered in ED Medications  potassium chloride 10 mEq in 100 mL IVPB (has no administration in time range)  potassium chloride (KLOR-CON) packet 60 mEq (has no administration in time range)  LORazepam (ATIVAN) injection 0-4 mg (has no administration in time  range)    Or  LORazepam (ATIVAN) tablet 0-4 mg (has no administration in time range)  LORazepam (ATIVAN) injection 0-4 mg (has no administration in time range)    Or  LORazepam (ATIVAN) tablet 0-4 mg (has no administration in time range)  thiamine (VITAMIN B1) tablet 100 mg (has no administration in time range)    Or  thiamine (VITAMIN B1) injection 100 mg (has no administration in time range)    ED Course/ Medical Decision Making/ A&P                                 Medical Decision Making Amount and/or Complexity of Data Reviewed Labs: ordered.  Risk OTC drugs. Prescription drug management.   53 year old male presents  today for concern of hypokalemia.  His potassium was 2.3 at behavioral health urgent care.  Will repeat this.  Will give additional potassium supplement.  60 mEq p.o. ordered.  5 rounds of IV potassium ordered.  CIWA protocol ordered. Most recent labs are from just a couple hours ago. Will repeat but will also discussed with hospitalist for admission.  According to behavioral health urgent care once patient is medically cleared and his potassium is repleted he can return to behavioral health urgent care as he is awaiting acceptance to [facility].  Discussed with hospitalist who will evaluate patient for admission.  Magnesium level ordered as well as magnesium repletion.   Final Clinical Impression(s) / ED Diagnoses Final diagnoses:  Alcohol abuse  Hypokalemia    Rx / DC Orders ED Discharge Orders     None         Marita Kansas, PA-C 09/30/23 1436    Elayne Snare K, DO 09/30/23 1529

## 2023-10-01 DIAGNOSIS — D696 Thrombocytopenia, unspecified: Secondary | ICD-10-CM

## 2023-10-01 DIAGNOSIS — E876 Hypokalemia: Secondary | ICD-10-CM

## 2023-10-01 DIAGNOSIS — R569 Unspecified convulsions: Secondary | ICD-10-CM

## 2023-10-01 DIAGNOSIS — E871 Hypo-osmolality and hyponatremia: Secondary | ICD-10-CM

## 2023-10-01 DIAGNOSIS — F10930 Alcohol use, unspecified with withdrawal, uncomplicated: Secondary | ICD-10-CM | POA: Diagnosis not present

## 2023-10-01 LAB — COMPREHENSIVE METABOLIC PANEL
ALT: 22 U/L (ref 0–44)
AST: 66 U/L — ABNORMAL HIGH (ref 15–41)
Albumin: 3.7 g/dL (ref 3.5–5.0)
Alkaline Phosphatase: 88 U/L (ref 38–126)
Anion gap: 11 (ref 5–15)
BUN: 10 mg/dL (ref 6–20)
CO2: 31 mmol/L (ref 22–32)
Calcium: 8.7 mg/dL — ABNORMAL LOW (ref 8.9–10.3)
Chloride: 90 mmol/L — ABNORMAL LOW (ref 98–111)
Creatinine, Ser: 0.58 mg/dL — ABNORMAL LOW (ref 0.61–1.24)
GFR, Estimated: >60 mL/min (ref >60)
Glucose, Bld: 93 mg/dL (ref 70–99)
Potassium: 3 mmol/L — ABNORMAL LOW (ref 3.5–5.1)
Sodium: 132 mmol/L — ABNORMAL LOW (ref 135–145)
Total Bilirubin: 2.2 mg/dL — ABNORMAL HIGH (ref 0.0–1.2)
Total Protein: 6.7 g/dL (ref 6.5–8.1)

## 2023-10-01 LAB — BASIC METABOLIC PANEL
Anion gap: 12 (ref 5–15)
BUN: 10 mg/dL (ref 6–20)
CO2: 31 mmol/L (ref 22–32)
Calcium: 8.9 mg/dL (ref 8.9–10.3)
Chloride: 91 mmol/L — ABNORMAL LOW (ref 98–111)
Creatinine, Ser: 0.59 mg/dL — ABNORMAL LOW (ref 0.61–1.24)
GFR, Estimated: >60 mL/min (ref >60)
Glucose, Bld: 99 mg/dL (ref 70–99)
Potassium: 3.3 mmol/L — ABNORMAL LOW (ref 3.5–5.1)
Sodium: 134 mmol/L — ABNORMAL LOW (ref 135–145)

## 2023-10-01 LAB — CBC
HCT: 37.9 % — ABNORMAL LOW (ref 39.0–52.0)
Hemoglobin: 13.3 g/dL (ref 13.0–17.0)
MCH: 36.1 pg — ABNORMAL HIGH (ref 26.0–34.0)
MCHC: 35.1 g/dL (ref 30.0–36.0)
MCV: 103 fL — ABNORMAL HIGH (ref 80.0–100.0)
Platelets: 100 10*3/uL — ABNORMAL LOW (ref 150–400)
RBC: 3.68 MIL/uL — ABNORMAL LOW (ref 4.22–5.81)
RDW: 13.2 % (ref 11.5–15.5)
WBC: 4 10*3/uL (ref 4.0–10.5)
nRBC: 0 % (ref 0.0–0.2)

## 2023-10-01 LAB — MAGNESIUM: Magnesium: 1.8 mg/dL (ref 1.7–2.4)

## 2023-10-01 MED ORDER — MAGNESIUM SULFATE 2 GM/50ML IV SOLN
2.0000 g | Freq: Once | INTRAVENOUS | Status: AC
  Administered 2023-10-01: 2 g via INTRAVENOUS
  Filled 2023-10-01: qty 50

## 2023-10-01 MED ORDER — POTASSIUM CHLORIDE CRYS ER 20 MEQ PO TBCR
30.0000 meq | EXTENDED_RELEASE_TABLET | ORAL | Status: AC
  Administered 2023-10-01 (×3): 30 meq via ORAL
  Filled 2023-10-01 (×3): qty 1

## 2023-10-01 MED ORDER — LORAZEPAM 2 MG/ML IJ SOLN
0.0000 mg | Freq: Four times a day (QID) | INTRAMUSCULAR | Status: AC
  Administered 2023-10-01: 1 mg via INTRAVENOUS
  Administered 2023-10-01 – 2023-10-02 (×2): 2 mg via INTRAVENOUS
  Filled 2023-10-01 (×3): qty 1

## 2023-10-01 MED ORDER — LORAZEPAM 1 MG PO TABS
0.0000 mg | ORAL_TABLET | Freq: Four times a day (QID) | ORAL | Status: AC
  Administered 2023-10-02: 2 mg via ORAL
  Filled 2023-10-01: qty 2

## 2023-10-01 MED ORDER — SODIUM CHLORIDE 0.9 % IV SOLN: INTRAVENOUS | Status: DC

## 2023-10-01 NOTE — ED Notes (Signed)
 Pt ambulated to and from [facility] w/o need of assistance.

## 2023-10-01 NOTE — Progress Notes (Signed)
 PROGRESS NOTE    Austin Brown  EAV:[id number] DOB: 19-Jan-1971 DOA: 09/30/2023 PCP: Merri Brunette, MD    Brief Narrative:   Austin Brown is a 53 y.o. male with past medical history significant for asthma, HTN, EtOH abuse with prior hospitalization for EtOH withdrawal who presented initially to [facility] behavioral health in which she was requesting voluntary treatment for alcohol intoxication.  Given previous significant withdrawal symptoms, hypokalemia labs for analysis; EMS was activated and patient was brought to [facility] ED for further evaluation.  Patient reports last drink 24 hours prior to presentation on 09/29/2023.  Reports drinking 7-8 mixed drinks per day.  Denies chest pain, no abdominal pain, no fever, no hematemesis, no blood in the stool.  In the ED, temperature 97.7 F, HR 103, RR 18, BP 146/91, SpO2 97% on room air.  WBC 4.5, hemoglobin 14.9, platelet count 103.  Sodium 131, potassium 2.6, chloride 84, CO2 34, glucose 115, BUN 9, creatinine 0.64.  AST 78, ALT 25, total bilirubin 3.1.  TRH consulted for admission for further evaluation management of impending EtOH withdrawal, hypokalemia.  Assessment & Plan:   EtOH use disorder with withdrawal Patient presenting to the ED from behavioral health center for assistance with treatment of alcohol intoxication.  Has had previous significant withdrawal symptoms in the past, reports last alcohol drink on 09/29/2023.  Endorses 7-8 mixed drinks daily. -- Librium taper -- CIWA protocol with symptom triggered Ativan -- Multivitamin, thiamine, folic acid -- Continue monitor on telemetry -- TOC consult for subs abuse resources  Hypokalemia Potassium 3.0, will continue repletion. -- Repeat electrolytes in the a.m.  Hyponatremia Etiology likely secondary to severe alcohol use disorder. -- Na 134>132 -- NS at 75 mL/h -- CMP in am  Thrombocytopenia Etiology likely second to alcohol use disorder. -- Plt 103>100 -- Continue  monitor closely with CBC daily while on prophylactic Lovenox to ensure stability  Essential hypertension --Carvedilol 6.25 mg p.o. twice daily   DVT prophylaxis: enoxaparin (LOVENOX) injection 40 mg Start: 09/30/23 1500    Code Status: Full Code Family Communication: No family present bedside this morning  Disposition Plan:  Level of care: Progressive Status is: Inpatient Remains inpatient appropriate because: EtOH withdrawal    Consultants:  None  Procedures:  None  Antimicrobials:  None   Subjective: Patient seen examined bedside, lying in bed.  Asking about ordering breakfast.  RN present at bedside.  No complaints this morning.  Potassium remains low, will continue repletion.  Denies any "shakes".  Alert and oriented.  No other questions or concerns at this time.  Denies headache, no visual changes, no chest pain, no palpitations, no shortness of breath, no abdominal pain, no fever/chills/night sweats, no nausea/vomiting/diarrhea, no focal weakness, no fatigue, no paresthesias.  No acute events overnight per nursing.  Objective: Vitals:   10/01/23 0600 10/01/23 0822 10/01/23 0823 10/01/23 1227  BP:  (!) 157/98 (!) 157/98 (!) 141/95  Pulse: 76 89 85 76  Resp:  16  18  Temp:  98.5 F (36.9 C)  97.9 F (36.6 C)  TempSrc:  Oral  Oral  SpO2:  98%  99%    Intake/Output Summary (Last 24 hours) at 10/01/2023 1353 Last data filed at 10/01/2023 1020 Gross per 24 hour  Intake 40.53 ml  Output --  Net 40.53 ml   There were no vitals filed for this visit.  Examination:  Physical Exam: GEN: NAD, alert and oriented x 3, wd/wn HEENT: NCAT, PERRL, EOMI, sclera clear,  MMM PULM: CTAB w/o wheezes/crackles, normal respiratory effort, on room air CV: RRR w/o M/G/R GI: abd soft, NTND, NABS, no R/G/M MSK: no peripheral edema, muscle strength globally intact 5/5 bilateral upper/lower extremities NEURO: CN II-XII intact, no focal deficits, sensation to light touch intact PSYCH:  normal mood/affect Integumentary: dry/intact, no rashes or wounds    Data Reviewed: I have personally reviewed following labs and imaging studies  CBC: Recent Labs  Lab 09/29/23 2155 09/30/23 1419 10/01/23 0500  WBC 6.4 4.5 4.0  NEUTROABS 3.9 2.9  --   HGB 14.3 14.9 13.3  HCT 38.5* 41.4 37.9*  MCV 98.5 100.2* 103.0*  PLT 126* 103* 100*   Basic Metabolic Panel: Recent Labs  Lab 09/29/23 2155 09/30/23 1005 09/30/23 1419 09/30/23 2335 10/01/23 0500  NA 129* 134* 131* 134* 132*  K 2.1* 2.3* 2.6* 3.3* 3.0*  CL 79* 82* 84* 91* 90*  CO2 30 33* 34* 31 31  GLUCOSE 84 103* 115* 99 93  BUN <5* 5* 9 10 10   CREATININE 0.76 0.73 0.64 0.59* 0.58*  CALCIUM 8.7* 9.1 8.7* 8.9 8.7*  MG  --   --  1.4*  --  1.8   GFR: CrCl cannot be calculated (Unknown ideal weight.). Liver Function Tests: Recent Labs  Lab 09/29/23 2155 09/30/23 1419 10/01/23 0500  AST 79* 78* 66*  ALT 25 25 22   ALKPHOS 85 95 88  BILITOT 1.4* 3.1* 2.2*  PROT 6.3* 7.3 6.7  ALBUMIN 3.6 4.1 3.7   No results for input(s): "LIPASE", "AMYLASE" in the last 168 hours. No results for input(s): "AMMONIA" in the last 168 hours. Coagulation Profile: No results for input(s): "INR", "PROTIME" in the last 168 hours. Cardiac Enzymes: No results for input(s): "CKTOTAL", "CKMB", "CKMBINDEX", "TROPONINI" in the last 168 hours. BNP (last 3 results) No results for input(s): "PROBNP" in the last 8760 hours. HbA1C: Recent Labs    09/30/23 1005  HGBA1C 4.9   CBG: No results for input(s): "GLUCAP" in the last 168 hours. Lipid Profile: Recent Labs    09/30/23 1005  CHOL 220*  HDL 57  LDLCALC 140*  TRIG 115  CHOLHDL 3.9   Thyroid Function Tests: Recent Labs    09/29/23 2155  TSH 1.743   Anemia Panel: No results for input(s): "VITAMINB12", "FOLATE", "FERRITIN", "TIBC", "IRON", "RETICCTPCT" in the last 72 hours. Sepsis Labs: No results for input(s): "PROCALCITON", "LATICACIDVEN" in the last 168 hours.  No  results found for this or any previous visit (from the past 240 hours).       Radiology Studies: No results found.      Scheduled Meds:  carvedilol  6.25 mg Oral BID   chlordiazePOXIDE  25 mg Oral QID   Followed by   chlordiazePOXIDE  25 mg Oral TID   Followed by   Melene Muller ON 10/02/2023] chlordiazePOXIDE  25 mg Oral BH-qamhs   Followed by   Melene Muller ON 10/03/2023] chlordiazePOXIDE  25 mg Oral Daily   enoxaparin (LOVENOX) injection  40 mg Subcutaneous Q24H   FLUoxetine  40 mg Oral Daily   folic acid  1 mg Oral Daily   LORazepam  0-4 mg Intravenous Q6H   Or   LORazepam  0-4 mg Oral Q6H   [START ON 10/02/2023] LORazepam  0-4 mg Intravenous Q12H   Or   [START ON 10/02/2023] LORazepam  0-4 mg Oral Q12H   multivitamin with minerals  1 tablet Oral Daily   potassium chloride  30 mEq Oral Q3H   thiamine  100 mg Oral Daily   Or   thiamine  100 mg Intravenous Daily   Continuous Infusions:   LOS: 1 day    Time spent: 49 minutes spent on 10/01/2023 caring for this patient face-to-face including chart review, ordering labs/tests, documenting, discussion with nursing staff, consultants, updating family and interview/physical exam    Alvira Philips [place], DO [facility] Available via Epic secure chat 7am-7pm After these hours, please refer to coverage provider listed on [email or web address] 10/01/2023, 1:53 PM

## 2023-10-01 NOTE — ED Notes (Signed)
 Patient up walking to the restroom at this time

## 2023-10-01 NOTE — Plan of Care (Signed)
 ?  Problem: Coping: ?Goal: Level of anxiety will decrease ?Outcome: Progressing ?  ?Problem: Safety: ?Goal: Ability to remain free from injury will improve ?Outcome: Progressing ?  ?

## 2023-10-02 DIAGNOSIS — R569 Unspecified convulsions: Secondary | ICD-10-CM | POA: Diagnosis not present

## 2023-10-02 DIAGNOSIS — F10931 Alcohol use, unspecified with withdrawal delirium: Secondary | ICD-10-CM

## 2023-10-02 DIAGNOSIS — E876 Hypokalemia: Secondary | ICD-10-CM | POA: Diagnosis not present

## 2023-10-02 LAB — CBC
HCT: 39 % (ref 39.0–52.0)
Hemoglobin: 13 g/dL (ref 13.0–17.0)
MCH: 35.9 pg — ABNORMAL HIGH (ref 26.0–34.0)
MCHC: 33.3 g/dL (ref 30.0–36.0)
MCV: 107.7 fL — ABNORMAL HIGH (ref 80.0–100.0)
Platelets: 101 10*3/uL — ABNORMAL LOW (ref 150–400)
RBC: 3.62 MIL/uL — ABNORMAL LOW (ref 4.22–5.81)
RDW: 13.2 % (ref 11.5–15.5)
WBC: 3.9 10*3/uL — ABNORMAL LOW (ref 4.0–10.5)
nRBC: 0 % (ref 0.0–0.2)

## 2023-10-02 LAB — COMPREHENSIVE METABOLIC PANEL
ALT: 29 U/L (ref 0–44)
AST: 79 U/L — ABNORMAL HIGH (ref 15–41)
Albumin: 3.5 g/dL (ref 3.5–5.0)
Alkaline Phosphatase: 90 U/L (ref 38–126)
Anion gap: 12 (ref 5–15)
BUN: 12 mg/dL (ref 6–20)
CO2: 26 mmol/L (ref 22–32)
Calcium: 8.8 mg/dL — ABNORMAL LOW (ref 8.9–10.3)
Chloride: 96 mmol/L — ABNORMAL LOW (ref 98–111)
Creatinine, Ser: 0.45 mg/dL — ABNORMAL LOW (ref 0.61–1.24)
GFR, Estimated: >60 mL/min (ref >60)
Glucose, Bld: 90 mg/dL (ref 70–99)
Potassium: 3.2 mmol/L — ABNORMAL LOW (ref 3.5–5.1)
Sodium: 134 mmol/L — ABNORMAL LOW (ref 135–145)
Total Bilirubin: 1.5 mg/dL — ABNORMAL HIGH (ref 0.0–1.2)
Total Protein: 6.5 g/dL (ref 6.5–8.1)

## 2023-10-02 LAB — PHOSPHORUS: Phosphorus: 2.5 mg/dL (ref 2.5–4.6)

## 2023-10-02 LAB — MAGNESIUM: Magnesium: 1.8 mg/dL (ref 1.7–2.4)

## 2023-10-02 MED ORDER — ONDANSETRON HCL 4 MG/2ML IJ SOLN
4.0000 mg | Freq: Four times a day (QID) | INTRAMUSCULAR | Status: DC | PRN
  Administered 2023-10-02: 4 mg via INTRAVENOUS
  Filled 2023-10-02: qty 2

## 2023-10-02 MED ORDER — MAGNESIUM SULFATE 2 GM/50ML IV SOLN
2.0000 g | Freq: Once | INTRAVENOUS | Status: AC
  Administered 2023-10-02: 2 g via INTRAVENOUS
  Filled 2023-10-02: qty 50

## 2023-10-02 MED ORDER — POTASSIUM CHLORIDE CRYS ER 20 MEQ PO TBCR
40.0000 meq | EXTENDED_RELEASE_TABLET | ORAL | Status: AC
  Administered 2023-10-02 (×2): 40 meq via ORAL
  Filled 2023-10-02 (×2): qty 2

## 2023-10-02 NOTE — Progress Notes (Signed)
 Pt has removed his telemetry and doesn't want it back on. He states "Im not putting it back on, it gets in my way!". This RN educated pt the importance of having it on but insisted that he dont want it back on. NP Virgel Manifold and CCMD notifed. Will continue to monitor pt.

## 2023-10-02 NOTE — TOC Initial Note (Signed)
 Transition of Care [facility]) - Initial/Assessment Note    Patient Details  Name: Austin Brown MRN: [id number] Date of Birth: 1970/11/12  [facility] of [facility]) CM/SW Contact:    Larrie Kass, LCSW Phone Number: 10/02/2023, 11:27 AM  Clinical Narrative:                 Substance use resources has been added to pt's AVS.   Expected Discharge Plan: Home/Self Care Barriers to Discharge: Continued Medical Work up   Patient Goals and CMS Choice            Expected Discharge Plan and Services                                              Prior Living Arrangements/Services                       Activities of Daily Living   ADL Screening (condition at time of admission) Independently performs ADLs?: Yes (appropriate for developmental age) Is the patient deaf or have difficulty hearing?: No Does the patient have difficulty seeing, even when wearing glasses/contacts?: No Does the patient have difficulty concentrating, remembering, or making decisions?: No  Permission Sought/Granted                  Emotional Assessment              Admission diagnosis:  Alcohol withdrawal (HCC) [F10.939] Hypokalemia [E87.6] Alcohol abuse [F10.10] Patient Active Problem List   Diagnosis Date Noted   Alcohol withdrawal (HCC) 07/31/2023   Low blood pressure 07/31/2023   Electrolyte abnormality 07/31/2023   Nicotine dependence 11/07/2021   Hyponatremia 11/06/2021   Alcohol abuse 11/06/2021   Hypokalemia 11/06/2021   Hypomagnesemia 11/06/2021   Essential hypertension 11/06/2021   Mild intermittent asthma without complication 11/06/2021   Prolonged QT interval 11/06/2021   PCP:  Merri Brunette, MD Pharmacy:   [facility] [address], [place] - [address] AT [facility] [address] [place] [address] Phone: [phone] Fax: [phone]     Social Drivers of Health (SDOH) Social  History: SDOH Screenings   Food Insecurity: No Food Insecurity (10/01/2023)  Housing: Low Risk  (10/01/2023)  Transportation Needs: No Transportation Needs (10/01/2023)  Utilities: Not At Risk (10/01/2023)  Depression (PHQ2-9): High Risk (09/29/2023)  Social Connections: Moderately Isolated (07/31/2023)  Tobacco Use: High Risk (09/30/2023)   SDOH Interventions:     Readmission Risk Interventions     No data to display

## 2023-10-02 NOTE — Progress Notes (Signed)
 PROGRESS NOTE    Austin Brown  MVH:[id number] DOB: 19-Nov-1970 DOA: 09/30/2023 PCP: Merri Brunette, MD    Brief Narrative:   Austin Brown is a 53 y.o. male with past medical history significant for asthma, HTN, EtOH abuse with prior hospitalization for EtOH withdrawal who presented initially to [facility] behavioral health in which she was requesting voluntary treatment for alcohol intoxication.  Given previous significant withdrawal symptoms, hypokalemia labs for analysis; EMS was activated and patient was brought to [facility] ED for further evaluation.  Patient reports last drink 24 hours prior to presentation on 09/29/2023.  Reports drinking 7-8 mixed drinks per day.  Denies chest pain, no abdominal pain, no fever, no hematemesis, no blood in the stool.  In the ED, temperature 97.7 F, HR 103, RR 18, BP 146/91, SpO2 97% on room air.  WBC 4.5, hemoglobin 14.9, platelet count 103.  Sodium 131, potassium 2.6, chloride 84, CO2 34, glucose 115, BUN 9, creatinine 0.64.  AST 78, ALT 25, total bilirubin 3.1.  TRH consulted for admission for further evaluation management of impending EtOH withdrawal, hypokalemia.  Assessment & Plan:   EtOH use disorder with withdrawal Patient presenting to the ED from behavioral health center for assistance with treatment of alcohol intoxication.  Has had previous significant withdrawal symptoms in the past, reports last alcohol drink on 09/29/2023.  Endorses 7-8 mixed drinks daily. -- Librium taper -- CIWA protocol with symptom triggered Ativan -- Multivitamin, thiamine, folic acid -- Continue monitor on telemetry -- Fall precautions -- TOC consult for subs abuse resources  Hypokalemia Potassium 3.2, will continue repletion. -- Repeat electrolytes in the a.m.  Hyponatremia Etiology likely secondary to severe alcohol use disorder. -- Na 134>132>134 -- NS at 75 mL/h -- CMP in am  Thrombocytopenia Etiology likely second to alcohol use disorder. --  Plt 103>100>101 -- Continue monitor closely with CBC daily while on prophylactic Lovenox to ensure stability  Essential hypertension -- Carvedilol 6.25 mg p.o. twice daily   DVT prophylaxis: enoxaparin (LOVENOX) injection 40 mg Start: 09/30/23 1500    Code Status: Full Code Family Communication: No family present bedside this morning  Disposition Plan:  Level of care: Progressive Status is: Inpatient Remains inpatient appropriate because: EtOH withdrawal    Consultants:  None  Procedures:  None  Antimicrobials:  None   Subjective: Patient seen examined bedside, lying in bed.  Patient more confused this morning, CIWA up to 16 this morning.  Reports some mild "shakes".  Received oral Ativan 3 mg.  Trying to remove telemetry, upset about bed alarm.  He remains a high fall risk given his acute alcohol withdrawal and needs to be on fall precautions.  Discussed with RN this morning.  No other questions or concerns at this time.  Denies headache, no visual changes, no chest pain, no palpitations, no shortness of breath, no abdominal pain, no fever/chills/night sweats, no nausea/vomiting/diarrhea, no focal weakness, no fatigue, no paresthesias.  No acute events overnight per nursing staff.  Objective: Vitals:   10/01/23 2141 10/01/23 2208 10/02/23 0431 10/02/23 1230  BP: (!) 150/100  (!) 154/101 128/88  Pulse: 81   80  Resp: 18  19 20   Temp: 98.2 F (36.8 C)  98.4 F (36.9 C) 98.8 F (37.1 C)  TempSrc: Oral  Oral Oral  SpO2: 96%  96% 96%  Weight:  95.4 kg    Height:  5\' 10"  (1.778 m)      Intake/Output Summary (Last 24 hours) at 10/02/2023 1246  Last data filed at 10/01/2023 2200 Gross per 24 hour  Intake 360 ml  Output --  Net 360 ml   Filed Weights   10/01/23 2208  Weight: 95.4 kg    Examination:  Physical Exam: GEN: NAD, alert and oriented x 3, wd/wn HEENT: NCAT, PERRL, EOMI, sclera clear, MMM PULM: CTAB w/o wheezes/crackles, normal respiratory effort, on room  air CV: RRR w/o M/G/R GI: abd soft, NTND, NABS, no R/G/M MSK: no peripheral edema, muscle strength globally intact 5/5 bilateral upper/lower extremities NEURO: CN II-XII intact, no focal deficits, sensation to light touch intact PSYCH: normal mood/affect Integumentary: dry/intact, no rashes or wounds    Data Reviewed: I have personally reviewed following labs and imaging studies  CBC: Recent Labs  Lab 09/29/23 2155 09/30/23 1419 10/01/23 0500 10/02/23 0358  WBC 6.4 4.5 4.0 3.9*  NEUTROABS 3.9 2.9  --   --   HGB 14.3 14.9 13.3 13.0  HCT 38.5* 41.4 37.9* 39.0  MCV 98.5 100.2* 103.0* 107.7*  PLT 126* 103* 100* 101*   Basic Metabolic Panel: Recent Labs  Lab 09/30/23 1005 09/30/23 1419 09/30/23 2335 10/01/23 0500 10/02/23 0358  NA 134* 131* 134* 132* 134*  K 2.3* 2.6* 3.3* 3.0* 3.2*  CL 82* 84* 91* 90* 96*  CO2 33* 34* 31 31 26   GLUCOSE 103* 115* 99 93 90  BUN 5* 9 10 10 12   CREATININE 0.73 0.64 0.59* 0.58* 0.45*  CALCIUM 9.1 8.7* 8.9 8.7* 8.8*  MG  --  1.4*  --  1.8 1.8  PHOS  --   --   --   --  2.5   GFR: Estimated Creatinine Clearance: 126.7 mL/min (A) (by C-G formula based on SCr of 0.45 mg/dL (L)). Liver Function Tests: Recent Labs  Lab 09/29/23 2155 09/30/23 1419 10/01/23 0500 10/02/23 0358  AST 79* 78* 66* 79*  ALT 25 25 22 29   ALKPHOS 85 95 88 90  BILITOT 1.4* 3.1* 2.2* 1.5*  PROT 6.3* 7.3 6.7 6.5  ALBUMIN 3.6 4.1 3.7 3.5   No results for input(s): "LIPASE", "AMYLASE" in the last 168 hours. No results for input(s): "AMMONIA" in the last 168 hours. Coagulation Profile: No results for input(s): "INR", "PROTIME" in the last 168 hours. Cardiac Enzymes: No results for input(s): "CKTOTAL", "CKMB", "CKMBINDEX", "TROPONINI" in the last 168 hours. BNP (last 3 results) No results for input(s): "PROBNP" in the last 8760 hours. HbA1C: Recent Labs    09/30/23 1005  HGBA1C 4.9   CBG: No results for input(s): "GLUCAP" in the last 168 hours. Lipid  Profile: Recent Labs    09/30/23 1005  CHOL 220*  HDL 57  LDLCALC 140*  TRIG 115  CHOLHDL 3.9   Thyroid Function Tests: Recent Labs    09/29/23 2155  TSH 1.743   Anemia Panel: No results for input(s): "VITAMINB12", "FOLATE", "FERRITIN", "TIBC", "IRON", "RETICCTPCT" in the last 72 hours. Sepsis Labs: No results for input(s): "PROCALCITON", "LATICACIDVEN" in the last 168 hours.  No results found for this or any previous visit (from the past 240 hours).       Radiology Studies: No results found.      Scheduled Meds:  carvedilol  6.25 mg Oral BID   chlordiazePOXIDE  25 mg Oral TID   Followed by   chlordiazePOXIDE  25 mg Oral BH-qamhs   Followed by   Melene Muller ON 10/03/2023] chlordiazePOXIDE  25 mg Oral Daily   enoxaparin (LOVENOX) injection  40 mg Subcutaneous Q24H   FLUoxetine  40  mg Oral Daily   folic acid  1 mg Oral Daily   LORazepam  0-4 mg Intravenous Q12H   Or   LORazepam  0-4 mg Oral Q12H   LORazepam  0-4 mg Intravenous Q6H   Or   LORazepam  0-4 mg Oral Q6H   multivitamin with minerals  1 tablet Oral Daily   thiamine  100 mg Oral Daily   Or   thiamine  100 mg Intravenous Daily   Continuous Infusions:   LOS: 2 days    Time spent: 49 minutes spent on 10/02/2023 caring for this patient face-to-face including chart review, ordering labs/tests, documenting, discussion with nursing staff, consultants, updating family and interview/physical exam    Alvira Philips [place], DO [facility] Available via Epic secure chat 7am-7pm After these hours, please refer to coverage provider listed on [email or web address] 10/02/2023, 12:46 PM

## 2023-10-02 NOTE — Progress Notes (Signed)
 Pt getting irritated with bed alarm and told RN to turn it off. This RN again educated pt that its for his safety however he still insisted to have it turned off despite RN telling him the consequences of having it off. RN turned off bed alarm. Notified CN Lauren and NP Virgel Manifold.

## 2023-10-03 DIAGNOSIS — F10931 Alcohol use, unspecified with withdrawal delirium: Secondary | ICD-10-CM | POA: Diagnosis not present

## 2023-10-03 LAB — CBC
HCT: 37.7 % — ABNORMAL LOW (ref 39.0–52.0)
Hemoglobin: 12.4 g/dL — ABNORMAL LOW (ref 13.0–17.0)
MCH: 36 pg — ABNORMAL HIGH (ref 26.0–34.0)
MCHC: 32.9 g/dL (ref 30.0–36.0)
MCV: 109.6 fL — ABNORMAL HIGH (ref 80.0–100.0)
Platelets: 105 10*3/uL — ABNORMAL LOW (ref 150–400)
RBC: 3.44 MIL/uL — ABNORMAL LOW (ref 4.22–5.81)
RDW: 13.3 % (ref 11.5–15.5)
WBC: 4.7 10*3/uL (ref 4.0–10.5)
nRBC: 0 % (ref 0.0–0.2)

## 2023-10-03 LAB — COMPREHENSIVE METABOLIC PANEL
ALT: 31 U/L (ref 0–44)
AST: 77 U/L — ABNORMAL HIGH (ref 15–41)
Albumin: 3.4 g/dL — ABNORMAL LOW (ref 3.5–5.0)
Alkaline Phosphatase: 99 U/L (ref 38–126)
Anion gap: 7 (ref 5–15)
BUN: 11 mg/dL (ref 6–20)
CO2: 26 mmol/L (ref 22–32)
Calcium: 8.7 mg/dL — ABNORMAL LOW (ref 8.9–10.3)
Chloride: 101 mmol/L (ref 98–111)
Creatinine, Ser: 0.6 mg/dL — ABNORMAL LOW (ref 0.61–1.24)
GFR, Estimated: >60 mL/min (ref >60)
Glucose, Bld: 85 mg/dL (ref 70–99)
Potassium: 3.7 mmol/L (ref 3.5–5.1)
Sodium: 134 mmol/L — ABNORMAL LOW (ref 135–145)
Total Bilirubin: 1 mg/dL (ref 0.0–1.2)
Total Protein: 6.5 g/dL (ref 6.5–8.1)

## 2023-10-03 LAB — MAGNESIUM: Magnesium: 2 mg/dL (ref 1.7–2.4)

## 2023-10-03 MED ORDER — POTASSIUM CHLORIDE CRYS ER 20 MEQ PO TBCR
30.0000 meq | EXTENDED_RELEASE_TABLET | Freq: Once | ORAL | Status: AC
  Administered 2023-10-03: 30 meq via ORAL
  Filled 2023-10-03: qty 1

## 2023-10-03 NOTE — Plan of Care (Signed)
  Problem: Health Behavior/Discharge Planning: Goal: Ability to manage health-related needs will improve Outcome: Progressing   Problem: Coping: Goal: Level of anxiety will decrease Outcome: Progressing   

## 2023-10-03 NOTE — Progress Notes (Signed)
 PROGRESS NOTE    Austin Brown  ATF:[id number] DOB: 02-22-1971 DOA: 09/30/2023 PCP: Merri Brunette, MD    Brief Narrative:   Austin Brown is a 53 y.o. male with past medical history significant for asthma, HTN, EtOH abuse with prior hospitalization for EtOH withdrawal who presented initially to [facility] behavioral health in which she was requesting voluntary treatment for alcohol intoxication.  Given previous significant withdrawal symptoms, hypokalemia labs for analysis; EMS was activated and patient was brought to [facility] ED for further evaluation.  Patient reports last drink 24 hours prior to presentation on 09/29/2023.  Reports drinking 7-8 mixed drinks per day.  Denies chest pain, no abdominal pain, no fever, no hematemesis, no blood in the stool.  In the ED, temperature 97.7 F, HR 103, RR 18, BP 146/91, SpO2 97% on room air.  WBC 4.5, hemoglobin 14.9, platelet count 103.  Sodium 131, potassium 2.6, chloride 84, CO2 34, glucose 115, BUN 9, creatinine 0.64.  AST 78, ALT 25, total bilirubin 3.1.  TRH consulted for admission for further evaluation management of impending EtOH withdrawal, hypokalemia.  Assessment & Plan:   EtOH use disorder with withdrawal Patient presenting to the ED from behavioral health center for assistance with treatment of alcohol intoxication.  Has had previous significant withdrawal symptoms in the past, reports last alcohol drink on 09/29/2023.  Endorses 7-8 mixed drinks daily. -- Librium taper -- CIWA protocol with symptom triggered Ativan -- Multivitamin, thiamine, folic acid -- Continue monitor on telemetry -- Fall precautions -- TOC consult for subs abuse resources  Hypokalemia Potassium 3.7, will continue repletion. -- Repeat electrolytes in the a.m.  Hyponatremia Etiology likely secondary to severe alcohol use disorder. -- Na 134>132>134>136 -- NS at 75 mL/h -- CMP in am  Thrombocytopenia Etiology likely second to alcohol use  disorder. -- Plt 103>100>101 -- Continue monitor closely with CBC daily while on prophylactic Lovenox to ensure stability  Essential hypertension -- Carvedilol 6.25 mg p.o. twice daily   DVT prophylaxis: enoxaparin (LOVENOX) injection 40 mg Start: 09/30/23 1500    Code Status: Full Code Family Communication: No family present bedside this morning  Disposition Plan:  Level of care: Progressive Status is: Inpatient Remains inpatient appropriate because: EtOH withdrawal    Consultants:  None  Procedures:  None  Antimicrobials:  None   Subjective: Patient seen examined bedside, lying in bed.  Sleeping but easily arousable.  CIWA scores improving, down to 8 this morning.  Reports ate breakfast, 2 omelettes this morning.  Continues with mild "shakiness".   No other questions or concerns at this time.  Denies headache, no visual changes, no chest pain, no palpitations, no shortness of breath, no abdominal pain, no fever/chills/night sweats, no nausea/vomiting/diarrhea, no focal weakness, no fatigue, no paresthesias.  No acute events overnight per nursing staff.  Objective: Vitals:   10/02/23 2026 10/02/23 2027 10/02/23 2233 10/03/23 0445  BP: (!) 152/106 (!) 141/94 (!) 142/84 (!) 157/98  Pulse: 73 74 69 68  Resp:      Temp: 98.2 F (36.8 C)   98.4 F (36.9 C)  TempSrc: Oral   Oral  SpO2: 97%   96%  Weight:      Height:        Intake/Output Summary (Last 24 hours) at 10/03/2023 1238 Last data filed at 10/03/2023 0939 Gross per 24 hour  Intake 1000 ml  Output --  Net 1000 ml   Filed Weights   10/01/23 2208  Weight: 95.4 kg  Examination:  Physical Exam: GEN: NAD, alert and oriented x 3, wd/wn HEENT: NCAT, PERRL, EOMI, sclera clear, MMM PULM: CTAB w/o wheezes/crackles, normal respiratory effort, on room air CV: RRR w/o M/G/R GI: abd soft, NTND, NABS, no R/G/M MSK: no peripheral edema, muscle strength globally intact 5/5 bilateral upper/lower extremities NEURO:  CN II-XII intact, no focal deficits, sensation to light touch intact PSYCH: normal mood/affect Integumentary: dry/intact, no rashes or wounds    Data Reviewed: I have personally reviewed following labs and imaging studies  CBC: Recent Labs  Lab 09/29/23 2155 09/30/23 1419 10/01/23 0500 10/02/23 0358 10/03/23 0347  WBC 6.4 4.5 4.0 3.9* 4.7  NEUTROABS 3.9 2.9  --   --   --   HGB 14.3 14.9 13.3 13.0 12.4*  HCT 38.5* 41.4 37.9* 39.0 37.7*  MCV 98.5 100.2* 103.0* 107.7* 109.6*  PLT 126* 103* 100* 101* 105*   Basic Metabolic Panel: Recent Labs  Lab 09/30/23 1419 09/30/23 2335 10/01/23 0500 10/02/23 0358 10/03/23 0347  NA 131* 134* 132* 134* 134*  K 2.6* 3.3* 3.0* 3.2* 3.7  CL 84* 91* 90* 96* 101  CO2 34* 31 31 26 26   GLUCOSE 115* 99 93 90 85  BUN 9 10 10 12 11   CREATININE 0.64 0.59* 0.58* 0.45* 0.60*  CALCIUM 8.7* 8.9 8.7* 8.8* 8.7*  MG 1.4*  --  1.8 1.8 2.0  PHOS  --   --   --  2.5  --    GFR: Estimated Creatinine Clearance: 126.7 mL/min (A) (by C-G formula based on SCr of 0.6 mg/dL (L)). Liver Function Tests: Recent Labs  Lab 09/29/23 2155 09/30/23 1419 10/01/23 0500 10/02/23 0358 10/03/23 0347  AST 79* 78* 66* 79* 77*  ALT 25 25 22 29 31   ALKPHOS 85 95 88 90 99  BILITOT 1.4* 3.1* 2.2* 1.5* 1.0  PROT 6.3* 7.3 6.7 6.5 6.5  ALBUMIN 3.6 4.1 3.7 3.5 3.4*   No results for input(s): "LIPASE", "AMYLASE" in the last 168 hours. No results for input(s): "AMMONIA" in the last 168 hours. Coagulation Profile: No results for input(s): "INR", "PROTIME" in the last 168 hours. Cardiac Enzymes: No results for input(s): "CKTOTAL", "CKMB", "CKMBINDEX", "TROPONINI" in the last 168 hours. BNP (last 3 results) No results for input(s): "PROBNP" in the last 8760 hours. HbA1C: No results for input(s): "HGBA1C" in the last 72 hours.  CBG: No results for input(s): "GLUCAP" in the last 168 hours. Lipid Profile: No results for input(s): "CHOL", "HDL", "LDLCALC", "TRIG",  "CHOLHDL", "LDLDIRECT" in the last 72 hours.  Thyroid Function Tests: No results for input(s): "TSH", "T4TOTAL", "FREET4", "T3FREE", "THYROIDAB" in the last 72 hours.  Anemia Panel: No results for input(s): "VITAMINB12", "FOLATE", "FERRITIN", "TIBC", "IRON", "RETICCTPCT" in the last 72 hours. Sepsis Labs: No results for input(s): "PROCALCITON", "LATICACIDVEN" in the last 168 hours.  No results found for this or any previous visit (from the past 240 hours).       Radiology Studies: No results found.      Scheduled Meds:  carvedilol  6.25 mg Oral BID   chlordiazePOXIDE  25 mg Oral Daily   enoxaparin (LOVENOX) injection  40 mg Subcutaneous Q24H   FLUoxetine  40 mg Oral Daily   folic acid  1 mg Oral Daily   LORazepam  0-4 mg Intravenous Q12H   Or   LORazepam  0-4 mg Oral Q12H   LORazepam  0-4 mg Intravenous Q6H   Or   LORazepam  0-4 mg Oral Q6H   multivitamin  with minerals  1 tablet Oral Daily   thiamine  100 mg Oral Daily   Or   thiamine  100 mg Intravenous Daily   Continuous Infusions:   LOS: 3 days    Time spent: 49 minutes spent on 10/03/2023 caring for this patient face-to-face including chart review, ordering labs/tests, documenting, discussion with nursing staff, consultants, updating family and interview/physical exam    Alvira Philips [place], DO [facility] Available via Epic secure chat 7am-7pm After these hours, please refer to coverage provider listed on [email or web address] 10/03/2023, 12:38 PM

## 2023-10-03 NOTE — Plan of Care (Signed)

## 2023-10-04 DIAGNOSIS — R569 Unspecified convulsions: Secondary | ICD-10-CM | POA: Diagnosis not present

## 2023-10-04 DIAGNOSIS — F10931 Alcohol use, unspecified with withdrawal delirium: Secondary | ICD-10-CM | POA: Diagnosis not present

## 2023-10-04 LAB — COMPREHENSIVE METABOLIC PANEL
ALT: 41 U/L (ref 0–44)
AST: 84 U/L — ABNORMAL HIGH (ref 15–41)
Albumin: 3.5 g/dL (ref 3.5–5.0)
Alkaline Phosphatase: 95 U/L (ref 38–126)
Anion gap: 9 (ref 5–15)
BUN: 14 mg/dL (ref 6–20)
CO2: 25 mmol/L (ref 22–32)
Calcium: 9 mg/dL (ref 8.9–10.3)
Chloride: 100 mmol/L (ref 98–111)
Creatinine, Ser: 0.51 mg/dL — ABNORMAL LOW (ref 0.61–1.24)
GFR, Estimated: >60 mL/min (ref >60)
Glucose, Bld: 95 mg/dL (ref 70–99)
Potassium: 4.1 mmol/L (ref 3.5–5.1)
Sodium: 134 mmol/L — ABNORMAL LOW (ref 135–145)
Total Bilirubin: 0.8 mg/dL (ref 0.0–1.2)
Total Protein: 6.7 g/dL (ref 6.5–8.1)

## 2023-10-04 NOTE — Discharge Summary (Signed)
 Physician Discharge Summary  Praise Dolecki Garrabrant ZOX:[id number] DOB: Sep 10, 1970 DOA: 09/30/2023  PCP: Merri Brunette, MD  Admit date: 09/30/2023 Discharge date: 10/04/2023  Admitted From: Home Disposition: Home  Recommendations for Outpatient Follow-up:  Follow up with PCP in 1-2 weeks Continue to encourage alcohol cessation/abstinence  Home Health: No Equipment/Devices: None  Discharge Condition: Stable CODE STATUS: Full code Diet recommendation: Heart healthy diet  History of present illness:  Austin Brown is a 53 y.o. male with past medical history significant for asthma, HTN, EtOH abuse with prior hospitalization for EtOH withdrawal who presented initially to  [facility] behavioral health in which she was requesting voluntary treatment for alcohol intoxication.  Given previous significant withdrawal symptoms, hypokalemia labs for analysis; EMS was activated and patient was brought to [facility] ED for further evaluation.  Patient reports last drink 24 hours prior to presentation on 09/29/2023.  Reports drinking 7-8 mixed drinks per day.  Denies chest pain, no abdominal pain, no fever, no hematemesis, no blood in the stool.   In the ED, temperature 97.7 F, HR 103, RR 18, BP 146/91, SpO2 97% on room air.  WBC 4.5, hemoglobin 14.9, platelet count 103.  Sodium 131, potassium 2.6, chloride 84, CO2 34, glucose 115, BUN 9, creatinine 0.64.  AST 78, ALT 25, total bilirubin 3.1.  TRH consulted for admission for further evaluation management of impending EtOH withdrawal, hypokalemia.  Hospital course:  EtOH use disorder with withdrawal Patient presenting to the ED from behavioral health center for assistance with treatment of alcohol intoxication.  Has had previous significant withdrawal symptoms in the past, reports last alcohol drink on 09/29/2023.  Endorses 7-8 mixed drinks daily.  Patient was started on a Librium taper and followed CIWA protocol with symptom triggered Ativan.  Patient  progressed well during hospitalization with resolution of withdrawal symptoms.  Continue to encourage alcohol cessation/abstinence on discharge.   Hypokalemia Repleted during hospitalization.   Hyponatremia Etiology likely secondary to severe alcohol use disorder.    Thrombocytopenia Etiology likely second to alcohol use disorder.  Platelet count stable, 101 at time of discharge.  Essential hypertension Carvedilol 6.25 mg p.o. twice daily    Discharge Diagnoses:  Principal Problem:   Alcohol withdrawal [facility])    Discharge Instructions  Discharge Instructions     Call MD for:  difficulty breathing, headache or visual disturbances   Complete by: As directed    Call MD for:  extreme fatigue   Complete by: As directed    Call MD for:  persistant dizziness or light-headedness   Complete by: As directed    Call MD for:  persistant nausea and vomiting   Complete by: As directed    Call MD for:  severe uncontrolled pain   Complete by: As directed    Call MD for:  temperature >100.4   Complete by: As directed    Diet - low sodium heart healthy   Complete by: As directed    Increase activity slowly   Complete by: As directed       Allergies as of 10/04/2023       Reactions   Amoxicillin Other (See Comments)   Reaction not cited        Medication List     STOP taking these medications    fluticasone 50 MCG/ACT nasal spray Commonly known as: FLONASE   folic acid 1 MG tablet Commonly known as: FOLVITE   multivitamin with minerals Tabs tablet   thiamine 100 MG tablet Commonly known as:  VITAMIN B1       TAKE these medications    albuterol 108 (90 Base) MCG/ACT inhaler Commonly known as: VENTOLIN HFA Inhale 2 puffs into the lungs every 6 (six) hours as needed for wheezing or shortness of breath.   carvedilol 6.25 MG tablet Commonly known as: COREG Take 6.25 mg by mouth 2 (two) times daily.   feeding supplement Liqd Take 237 mLs by mouth 2 (two) times  daily between meals.   FLUoxetine 40 MG capsule Commonly known as: PROZAC Take 40 mg by mouth daily.   meloxicam 15 MG tablet Commonly known as: MOBIC Take 15 mg by mouth daily as needed for pain.   metFORMIN 500 MG tablet Commonly known as: GLUCOPHAGE Take 500 mg by mouth daily.   nicotine 21 mg/24hr patch Commonly known as: NICODERM CQ - dosed in mg/24 hours Place 1 patch (21 mg total) onto the skin daily.   traZODone 100 MG tablet Commonly known as: DESYREL Take 100 mg by mouth at bedtime.        Follow-up Information     Merri Brunette, MD. Schedule an appointment as soon as possible for a visit in 1 week(s).   Specialty: Internal Medicine Contact information: [address] [place] [address] [place] [address] [phone]                Allergies  Allergen Reactions   Amoxicillin Other (See Comments)    Reaction not cited    Consultations: None   Procedures/Studies: No results found.   Subjective: Patient seen examined bedside, just finished eating breakfast.  Feels "full".  No complaints or concerns at this time.  Ready for discharge home.  No further significant withdrawal symptoms.  Discussed once again the complete cessation/abstinence from alcohol to avoid future hospitalizations for such.  No other specific questions, concerns or complaints at this time.  Denies headache, no dizziness, no chest pain, no palpitation, no shortness of breath, no abdominal pain, no fever/chills/night sweats, no nausea/vomiting/diarrhea, no focal weakness, no fatigue, no paresthesia.  No acute events overnight per nursing staff.  Discharge Exam: Vitals:   10/03/23 2029 10/04/23 0501  BP: (!) 136/90 (!) 141/97  Pulse: 67 61  Resp:    Temp: 98.6 F (37 C) 99.3 F (37.4 C)  SpO2: 94% 96%   Vitals:   10/03/23 0445 10/03/23 1248 10/03/23 2029 10/04/23 0501  BP: (!) 157/98 125/89 (!) 136/90 (!) 141/97  Pulse: 68 70 67 61  Resp:  14    Temp: 98.4 F (36.9 C)  98.2 F (36.8 C) 98.6 F (37 C) 99.3 F (37.4 C)  TempSrc: Oral Oral Oral Oral  SpO2: 96% 96% 94% 96%  Weight:      Height:        Physical Exam: GEN: NAD, alert and oriented x 3, wd/wn HEENT: NCAT, PERRL, EOMI, sclera clear, MMM PULM: CTAB w/o wheezes/crackles, normal respiratory effort, on room air CV: RRR w/o M/G/R GI: abd soft, NTND, NABS, no R/G/M MSK: no peripheral edema, muscle strength globally intact 5/5 bilateral upper/lower extremities NEURO: CN II-XII intact, no focal deficits, sensation to light touch intact PSYCH: normal mood/affect Integumentary: dry/intact, no rashes or wounds    The results of significant diagnostics from this hospitalization (including imaging, microbiology, ancillary and laboratory) are listed below for reference.     Microbiology: No results found for this or any previous visit (from the past 240 hours).   Labs: BNP (last 3 results) No results for input(s): "BNP" in  the last 8760 hours. Basic Metabolic Panel: Recent Labs  Lab 09/30/23 1419 09/30/23 2335 10/01/23 0500 10/02/23 0358 10/03/23 0347 10/04/23 0345  NA 131* 134* 132* 134* 134* 134*  K 2.6* 3.3* 3.0* 3.2* 3.7 4.1  CL 84* 91* 90* 96* 101 100  CO2 34* 31 31 26 26 25   GLUCOSE 115* 99 93 90 85 95  BUN 9 10 10 12 11 14   CREATININE 0.64 0.59* 0.58* 0.45* 0.60* 0.51*  CALCIUM 8.7* 8.9 8.7* 8.8* 8.7* 9.0  MG 1.4*  --  1.8 1.8 2.0  --   PHOS  --   --   --  2.5  --   --    Liver Function Tests: Recent Labs  Lab 09/30/23 1419 10/01/23 0500 10/02/23 0358 10/03/23 0347 10/04/23 0345  AST 78* 66* 79* 77* 84*  ALT 25 22 29 31  41  ALKPHOS 95 88 90 99 95  BILITOT 3.1* 2.2* 1.5* 1.0 0.8  PROT 7.3 6.7 6.5 6.5 6.7  ALBUMIN 4.1 3.7 3.5 3.4* 3.5   No results for input(s): "LIPASE", "AMYLASE" in the last 168 hours. No results for input(s): "AMMONIA" in the last 168 hours. CBC: Recent Labs  Lab 09/29/23 2155 09/30/23 1419 10/01/23 0500 10/02/23 0358 10/03/23 0347  WBC  6.4 4.5 4.0 3.9* 4.7  NEUTROABS 3.9 2.9  --   --   --   HGB 14.3 14.9 13.3 13.0 12.4*  HCT 38.5* 41.4 37.9* 39.0 37.7*  MCV 98.5 100.2* 103.0* 107.7* 109.6*  PLT 126* 103* 100* 101* 105*   Cardiac Enzymes: No results for input(s): "CKTOTAL", "CKMB", "CKMBINDEX", "TROPONINI" in the last 168 hours. BNP: Invalid input(s): "POCBNP" CBG: No results for input(s): "GLUCAP" in the last 168 hours. D-Dimer No results for input(s): "DDIMER" in the last 72 hours. Hgb A1c No results for input(s): "HGBA1C" in the last 72 hours. Lipid Profile No results for input(s): "CHOL", "HDL", "LDLCALC", "TRIG", "CHOLHDL", "LDLDIRECT" in the last 72 hours. Thyroid function studies No results for input(s): "TSH", "T4TOTAL", "T3FREE", "THYROIDAB" in the last 72 hours.  Invalid input(s): "FREET3" Anemia work up No results for input(s): "VITAMINB12", "FOLATE", "FERRITIN", "TIBC", "IRON", "RETICCTPCT" in the last 72 hours. Urinalysis    Component Value Date/Time   COLORURINE YELLOW 11/06/2021 2054   APPEARANCEUR CLEAR 11/06/2021 2054   LABSPEC 1.008 11/06/2021 2054   PHURINE 7.5 11/06/2021 2054   GLUCOSEU NEGATIVE 11/06/2021 2054   HGBUR NEGATIVE 11/06/2021 2054   BILIRUBINUR NEGATIVE 11/06/2021 2054   KETONESUR NEGATIVE 11/06/2021 2054   PROTEINUR NEGATIVE 11/06/2021 2054   UROBILINOGEN 0.2 08/27/2014 2131   NITRITE NEGATIVE 11/06/2021 2054   LEUKOCYTESUR NEGATIVE 11/06/2021 2054   Sepsis Labs Recent Labs  Lab 09/30/23 1419 10/01/23 0500 10/02/23 0358 10/03/23 0347  WBC 4.5 4.0 3.9* 4.7   Microbiology No results found for this or any previous visit (from the past 240 hours).   Time coordinating discharge: Over 30 minutes  SIGNED:   Alvira Philips [place], DO  [facility] 10/04/2023, 9:53 AM

## 2024-01-25 ENCOUNTER — Inpatient Hospital Stay (HOSPITAL_COMMUNITY)
Admission: EM | Admit: 2024-01-25 | Discharge: 2024-02-02 | DRG: 897 | Disposition: A | Discharge status: Home / Self Care | Attending: Internal Medicine | Admitting: Internal Medicine

## 2024-01-25 ENCOUNTER — Other Ambulatory Visit: Payer: Self-pay

## 2024-01-25 ENCOUNTER — Encounter (HOSPITAL_COMMUNITY): Payer: Self-pay

## 2024-01-25 ENCOUNTER — Emergency Department (HOSPITAL_COMMUNITY)

## 2024-01-25 DIAGNOSIS — J45909 Unspecified asthma, uncomplicated: Secondary | ICD-10-CM | POA: Diagnosis present

## 2024-01-25 DIAGNOSIS — R079 Chest pain, unspecified: Secondary | ICD-10-CM | POA: Diagnosis not present

## 2024-01-25 DIAGNOSIS — F10939 Alcohol use, unspecified with withdrawal, unspecified: Secondary | ICD-10-CM | POA: Diagnosis not present

## 2024-01-25 DIAGNOSIS — W1830XA Fall on same level, unspecified, initial encounter: Secondary | ICD-10-CM | POA: Diagnosis present

## 2024-01-25 DIAGNOSIS — R0789 Other chest pain: Secondary | ICD-10-CM | POA: Diagnosis not present

## 2024-01-25 DIAGNOSIS — I10 Essential (primary) hypertension: Secondary | ICD-10-CM | POA: Diagnosis present

## 2024-01-25 DIAGNOSIS — Y9301 Activity, walking, marching and hiking: Secondary | ICD-10-CM | POA: Diagnosis not present

## 2024-01-25 DIAGNOSIS — Z781 Physical restraint status: Secondary | ICD-10-CM | POA: Diagnosis not present

## 2024-01-25 DIAGNOSIS — R918 Other nonspecific abnormal finding of lung field: Secondary | ICD-10-CM | POA: Diagnosis not present

## 2024-01-25 DIAGNOSIS — E860 Dehydration: Secondary | ICD-10-CM | POA: Diagnosis present

## 2024-01-25 DIAGNOSIS — F17210 Nicotine dependence, cigarettes, uncomplicated: Secondary | ICD-10-CM | POA: Diagnosis not present

## 2024-01-25 DIAGNOSIS — R7401 Elevation of levels of liver transaminase levels: Secondary | ICD-10-CM | POA: Insufficient documentation

## 2024-01-25 DIAGNOSIS — E872 Acidosis, unspecified: Secondary | ICD-10-CM | POA: Diagnosis present

## 2024-01-25 DIAGNOSIS — D693 Immune thrombocytopenic purpura: Secondary | ICD-10-CM | POA: Diagnosis not present

## 2024-01-25 DIAGNOSIS — R296 Repeated falls: Secondary | ICD-10-CM | POA: Diagnosis present

## 2024-01-25 DIAGNOSIS — K760 Fatty (change of) liver, not elsewhere classified: Secondary | ICD-10-CM | POA: Diagnosis present

## 2024-01-25 DIAGNOSIS — F10931 Alcohol use, unspecified with withdrawal delirium: Secondary | ICD-10-CM | POA: Diagnosis present

## 2024-01-25 DIAGNOSIS — F1090 Alcohol use, unspecified, uncomplicated: Secondary | ICD-10-CM

## 2024-01-25 DIAGNOSIS — F1290 Cannabis use, unspecified, uncomplicated: Secondary | ICD-10-CM | POA: Diagnosis not present

## 2024-01-25 DIAGNOSIS — R7303 Prediabetes: Secondary | ICD-10-CM | POA: Diagnosis not present

## 2024-01-25 DIAGNOSIS — Z7984 Long term (current) use of oral hypoglycemic drugs: Secondary | ICD-10-CM

## 2024-01-25 DIAGNOSIS — Y92230 Patient room in hospital as the place of occurrence of the external cause: Secondary | ICD-10-CM | POA: Diagnosis present

## 2024-01-25 DIAGNOSIS — F1010 Alcohol abuse, uncomplicated: Principal | ICD-10-CM

## 2024-01-25 DIAGNOSIS — R197 Diarrhea, unspecified: Secondary | ICD-10-CM | POA: Diagnosis present

## 2024-01-25 DIAGNOSIS — J4520 Mild intermittent asthma, uncomplicated: Secondary | ICD-10-CM | POA: Diagnosis not present

## 2024-01-25 DIAGNOSIS — Z79899 Other long term (current) drug therapy: Secondary | ICD-10-CM | POA: Diagnosis not present

## 2024-01-25 DIAGNOSIS — Z9181 History of falling: Secondary | ICD-10-CM | POA: Diagnosis not present

## 2024-01-25 DIAGNOSIS — Z880 Allergy status to penicillin: Secondary | ICD-10-CM | POA: Diagnosis not present

## 2024-01-25 DIAGNOSIS — Y908 Blood alcohol level of 240 mg/100 ml or more: Secondary | ICD-10-CM | POA: Diagnosis present

## 2024-01-25 DIAGNOSIS — E876 Hypokalemia: Secondary | ICD-10-CM | POA: Diagnosis not present

## 2024-01-25 DIAGNOSIS — R112 Nausea with vomiting, unspecified: Secondary | ICD-10-CM | POA: Insufficient documentation

## 2024-01-25 DIAGNOSIS — F411 Generalized anxiety disorder: Secondary | ICD-10-CM | POA: Diagnosis not present

## 2024-01-25 DIAGNOSIS — F10930 Alcohol use, unspecified with withdrawal, uncomplicated: Secondary | ICD-10-CM | POA: Diagnosis not present

## 2024-01-25 DIAGNOSIS — E871 Hypo-osmolality and hyponatremia: Secondary | ICD-10-CM | POA: Diagnosis present

## 2024-01-25 DIAGNOSIS — G319 Degenerative disease of nervous system, unspecified: Secondary | ICD-10-CM | POA: Diagnosis not present

## 2024-01-25 DIAGNOSIS — F10231 Alcohol dependence with withdrawal delirium: Secondary | ICD-10-CM | POA: Diagnosis not present

## 2024-01-25 LAB — MAGNESIUM: Magnesium: 1.6 mg/dL — ABNORMAL LOW (ref 1.7–2.4)

## 2024-01-25 LAB — CBC WITH DIFFERENTIAL/PLATELET
Abs Immature Granulocytes: 0.03 K/uL (ref 0.00–0.07)
Basophils Absolute: 0 K/uL (ref 0.0–0.1)
Basophils Relative: 0 %
Eosinophils Absolute: 0.1 K/uL (ref 0.0–0.5)
Eosinophils Relative: 1 %
HCT: 44.7 % (ref 39.0–52.0)
Hemoglobin: 16.2 g/dL (ref 13.0–17.0)
Immature Granulocytes: 0 %
Lymphocytes Relative: 21 %
Lymphs Abs: 1.7 K/uL (ref 0.7–4.0)
MCH: 36 pg — ABNORMAL HIGH (ref 26.0–34.0)
MCHC: 36.2 g/dL — ABNORMAL HIGH (ref 30.0–36.0)
MCV: 99.3 fL (ref 80.0–100.0)
Monocytes Absolute: 1.3 K/uL — ABNORMAL HIGH (ref 0.1–1.0)
Monocytes Relative: 16 %
Neutro Abs: 4.9 K/uL (ref 1.7–7.7)
Neutrophils Relative %: 62 %
Platelets: 136 K/uL — ABNORMAL LOW (ref 150–400)
RBC: 4.5 MIL/uL (ref 4.22–5.81)
RDW: 12.9 % (ref 11.5–15.5)
WBC: 8 K/uL (ref 4.0–10.5)
nRBC: 0 % (ref 0.0–0.2)

## 2024-01-25 LAB — COMPREHENSIVE METABOLIC PANEL WITH GFR
ALT: 49 U/L — ABNORMAL HIGH (ref 0–44)
AST: 151 U/L — ABNORMAL HIGH (ref 15–41)
Albumin: 3.7 g/dL (ref 3.5–5.0)
Alkaline Phosphatase: 144 U/L — ABNORMAL HIGH (ref 38–126)
Anion gap: 17 — ABNORMAL HIGH (ref 5–15)
BUN: <5 mg/dL — ABNORMAL LOW (ref 6–20)
CO2: 27 mmol/L (ref 22–32)
Calcium: 8.4 mg/dL — ABNORMAL LOW (ref 8.9–10.3)
Chloride: 81 mmol/L — ABNORMAL LOW (ref 98–111)
Creatinine, Ser: 0.64 mg/dL (ref 0.61–1.24)
GFR, Estimated: >60 mL/min (ref >60)
Glucose, Bld: 111 mg/dL — ABNORMAL HIGH (ref 70–99)
Potassium: 2.8 mmol/L — ABNORMAL LOW (ref 3.5–5.1)
Sodium: 125 mmol/L — ABNORMAL LOW (ref 135–145)
Total Bilirubin: 1.3 mg/dL — ABNORMAL HIGH (ref 0.0–1.2)
Total Protein: 7.4 g/dL (ref 6.5–8.1)

## 2024-01-25 LAB — LACTIC ACID, PLASMA
Lactic Acid, Venous: 4.1 mmol/L (ref 0.5–1.9)
Lactic Acid, Venous: 4.2 mmol/L (ref 0.5–1.9)

## 2024-01-25 LAB — I-STAT CHEM 8, ED
BUN: <3 mg/dL — ABNORMAL LOW (ref 6–20)
Calcium, Ion: 0.97 mmol/L — ABNORMAL LOW (ref 1.15–1.40)
Chloride: 83 mmol/L — ABNORMAL LOW (ref 98–111)
Creatinine, Ser: 1.1 mg/dL (ref 0.61–1.24)
Glucose, Bld: 113 mg/dL — ABNORMAL HIGH (ref 70–99)
HCT: 50 % (ref 39.0–52.0)
Hemoglobin: 17 g/dL (ref 13.0–17.0)
Potassium: 3 mmol/L — ABNORMAL LOW (ref 3.5–5.1)
Sodium: 129 mmol/L — ABNORMAL LOW (ref 135–145)
TCO2: 29 mmol/L (ref 22–32)

## 2024-01-25 LAB — TROPONIN I (HIGH SENSITIVITY)
Troponin I (High Sensitivity): 6 ng/L (ref <18)
Troponin I (High Sensitivity): 6 ng/L (ref <18)

## 2024-01-25 LAB — LIPASE, BLOOD: Lipase: 49 U/L (ref 11–51)

## 2024-01-25 LAB — ETHANOL: Alcohol, Ethyl (B): 311 mg/dL (ref <15)

## 2024-01-25 MED ORDER — ONDANSETRON HCL 4 MG/2ML IJ SOLN
4.0000 mg | Freq: Once | INTRAMUSCULAR | Status: AC
  Administered 2024-01-25: 4 mg via INTRAVENOUS
  Filled 2024-01-25: qty 2

## 2024-01-25 MED ORDER — THIAMINE HCL 100 MG/ML IJ SOLN
100.0000 mg | Freq: Every day | INTRAMUSCULAR | Status: DC
  Administered 2024-01-27 – 2024-01-30 (×2): 100 mg via INTRAVENOUS
  Filled 2024-01-25 (×2): qty 2

## 2024-01-25 MED ORDER — POTASSIUM CHLORIDE 10 MEQ/100ML IV SOLN
10.0000 meq | INTRAVENOUS | Status: AC
  Administered 2024-01-25 – 2024-01-26 (×3): 10 meq via INTRAVENOUS
  Filled 2024-01-25 (×3): qty 100

## 2024-01-25 MED ORDER — LORAZEPAM 1 MG PO TABS
1.0000 mg | ORAL_TABLET | ORAL | Status: AC | PRN
  Administered 2024-01-27: 2 mg via ORAL
  Administered 2024-01-27: 1 mg via ORAL
  Administered 2024-01-28: 2 mg via ORAL
  Filled 2024-01-25: qty 1
  Filled 2024-01-25 (×2): qty 2

## 2024-01-25 MED ORDER — ADULT MULTIVITAMIN W/MINERALS CH
1.0000 | ORAL_TABLET | Freq: Every day | ORAL | Status: DC
  Administered 2024-01-26 – 2024-02-02 (×7): 1 via ORAL
  Filled 2024-01-25 (×7): qty 1

## 2024-01-25 MED ORDER — FOLIC ACID 1 MG PO TABS
1.0000 mg | ORAL_TABLET | Freq: Every day | ORAL | Status: DC
  Administered 2024-01-26 – 2024-02-02 (×7): 1 mg via ORAL
  Filled 2024-01-25 (×7): qty 1

## 2024-01-25 MED ORDER — THIAMINE MONONITRATE 100 MG PO TABS
100.0000 mg | ORAL_TABLET | Freq: Every day | ORAL | Status: DC
  Administered 2024-01-26 – 2024-02-02 (×6): 100 mg via ORAL
  Filled 2024-01-25 (×6): qty 1

## 2024-01-25 MED ORDER — LACTATED RINGERS IV BOLUS
1000.0000 mL | Freq: Once | INTRAVENOUS | Status: AC
  Administered 2024-01-25: 1000 mL via INTRAVENOUS

## 2024-01-25 MED ORDER — LORAZEPAM 2 MG/ML IJ SOLN
1.0000 mg | INTRAMUSCULAR | Status: AC | PRN
  Administered 2024-01-25 – 2024-01-26 (×2): 2 mg via INTRAVENOUS
  Administered 2024-01-26 (×3): 4 mg via INTRAVENOUS
  Administered 2024-01-26 (×3): 2 mg via INTRAVENOUS
  Administered 2024-01-27: 4 mg via INTRAVENOUS
  Administered 2024-01-28: 2 mg via INTRAVENOUS
  Administered 2024-01-28 (×3): 4 mg via INTRAVENOUS
  Filled 2024-01-25: qty 2
  Filled 2024-01-25 (×3): qty 1
  Filled 2024-01-25 (×2): qty 2
  Filled 2024-01-25 (×2): qty 1
  Filled 2024-01-25 (×3): qty 2
  Filled 2024-01-25 (×4): qty 1
  Filled 2024-01-25: qty 2

## 2024-01-25 MED ORDER — MAGNESIUM SULFATE 2 GM/50ML IV SOLN
2.0000 g | Freq: Once | INTRAVENOUS | Status: AC
  Administered 2024-01-25: 2 g via INTRAVENOUS
  Filled 2024-01-25: qty 50

## 2024-01-25 MED ORDER — MAGNESIUM SULFATE 50 % IJ SOLN: 2.0000 g | Freq: Once | INTRAMUSCULAR | Status: DC

## 2024-01-25 NOTE — ED Triage Notes (Signed)
 Pt reports 4 days of diarrhea and chest pain. Pt states I know my potassium is probably low.

## 2024-01-25 NOTE — ED Provider Triage Note (Signed)
 Emergency Medicine Provider Triage Evaluation Note  Austin Brown , a 53 y.o. male  was evaluated in triage.  Pt complains of vomiting, diarrhea, and some chest pain afterwards. He reports that he feels that his potassium is low because of the vomiting and diarrhea. Last drink 1500 today. Usually consumes ten mixed drinks a day. Denies fever, SOB, or hematemesis, coffee ground emesis.   Review of Systems  Positive:  Negative:   Physical Exam  BP 108/79   Pulse (!) 128   Temp (!) 97.4 F (36.3 C)   Resp (!) 22   Ht 5' 10 (1.778 m)   Wt 95.4 kg   SpO2 97%   BMI 30.18 kg/m  Gen:   Awake, no distress   Resp:  Normal effort  MSK:   Moves extremities without difficulty  Other:  Appears intoxicated, smells of alcohol. Non tender to palpation. Reports burning chets pain.   Medical Decision Making  Medically screening exam initiated at 8:45 PM.  Appropriate orders placed.  Patryck Kilgore Damiani was informed that the remainder of the evaluation will be completed by another provider, this initial triage assessment does not replace that evaluation, and the importance of remaining in the ED until their evaluation is complete.  Labs and fluids ordered.    Bernis Ernst, [place] 01/25/24 2048

## 2024-01-25 NOTE — ED Provider Notes (Signed)
 [place] EMERGENCY DEPARTMENT AT [facility] Provider Note   CSN: [id number] Arrival date & time: 01/25/24  1944     Patient presents with: Diarrhea and Chest Pain   Austin Brown is a 53 y.o. male.  Patient past history significant for alcohol dependence, alcohol withdrawal presents to the emergency department with concerns of nausea, vomiting, diarrhea.  Reports symptoms ongoing for last 4 days with epigastric discomfort.  Believes his potassium is likely low and took 2 potassium tablets at home.  Believes that his symptoms are likely due to alcohol withdrawal although endorses that her last drink was around 3 or 4 PM this afternoon.  Typically drinks 6-7 malt beers that are 10% alcohol by weight.  Denies any recent substance use beyond a little bit of marijuana.  He believes his abdominal pain is likely from the vomiting that he has been experiencing.  Denies any recent fever, chills or bodyaches.  No significant cough, shortness of breath, or leg swelling.  Denies any significant cardiac history.   Diarrhea Chest Pain      Prior to Admission medications   Medication Sig Start Date End Date Taking? Authorizing Provider  albuterol  (VENTOLIN  HFA) 108 (90 Base) MCG/ACT inhaler Inhale 2 puffs into the lungs every 6 (six) hours as needed for wheezing or shortness of breath. 05/31/21  Yes [provider]  carvedilol  (COREG ) 6.25 MG tablet Take 6.25 mg by mouth 2 (two) times daily. 07/27/23  Yes [provider]  feeding supplement (ENSURE ENLIVE / ENSURE PLUS) LIQD Take 237 mLs by mouth 2 (two) times daily between meals. Patient taking differently: Take 237 mLs by mouth daily. 08/04/23  Yes Sheikh, Omair Latif, DO  FLUoxetine  (PROZAC ) 40 MG capsule Take 40 mg by mouth daily. 01/06/23  Yes [provider]  metFORMIN (GLUCOPHAGE) 500 MG tablet Take 500 mg by mouth daily. 03/31/23  Yes [provider]  Potassium Chloride  ER 20 MEQ TBCR Take 1 tablet  by mouth daily. 11/26/23  Yes [provider]  traZODone  (DESYREL ) 100 MG tablet Take 100 mg by mouth at bedtime as needed for sleep. 10/14/21  Yes [provider]  nicotine  (NICODERM CQ  - DOSED IN MG/24 HOURS) 21 mg/24hr patch Place 1 patch (21 mg total) onto the skin daily. Patient not taking: Reported on 01/26/2024 11/10/21   Regalado, Belkys A, MD    Allergies: Amoxicillin    Review of Systems  Cardiovascular:  Positive for chest pain.  Gastrointestinal:  Positive for diarrhea.  All other systems reviewed and are negative.   Updated Vital Signs BP (!) 132/90   Pulse 100   Temp 98 F (36.7 C)   Resp 13   Ht 5' 10 (1.778 m)   Wt 95.4 kg   SpO2 100%   BMI 30.18 kg/m   Physical Exam Vitals and nursing note reviewed.  Constitutional:      General: He is not in acute distress.    Appearance: He is well-developed. He is not ill-appearing.  HENT:     Head: Normocephalic and atraumatic.  Eyes:     Conjunctiva/sclera: Conjunctivae normal.  Cardiovascular:     Rate and Rhythm: Normal rate and regular rhythm.     Heart sounds: No murmur heard. Pulmonary:     Effort: Pulmonary effort is normal. No respiratory distress.     Breath sounds: Normal breath sounds. No decreased breath sounds, wheezing or rhonchi.  Chest:     Chest wall: No tenderness.  Abdominal:  Palpations: Abdomen is soft. There is no mass.     Tenderness: There is no abdominal tenderness. There is no guarding.  Musculoskeletal:        General: No swelling.     Cervical back: Neck supple.  Skin:    General: Skin is warm and dry.     Capillary Refill: Capillary refill takes less than 2 seconds.  Neurological:     Mental Status: He is alert.  Psychiatric:        Mood and Affect: Mood normal.     (all labs ordered are listed, but only abnormal results are displayed) Labs Reviewed  CBC WITH DIFFERENTIAL/PLATELET - Abnormal; Notable for the following components:      Result Value   MCH  36.0 (*)    MCHC 36.2 (*)    Platelets 136 (*)    Monocytes Absolute 1.3 (*)    All other components within normal limits  COMPREHENSIVE METABOLIC PANEL WITH GFR - Abnormal; Notable for the following components:   Sodium 125 (*)    Potassium 2.8 (*)    Chloride 81 (*)    Glucose, Bld 111 (*)    BUN <5 (*)    Calcium 8.4 (*)    AST 151 (*)    ALT 49 (*)    Alkaline Phosphatase 144 (*)    Total Bilirubin 1.3 (*)    Anion gap 17 (*)    All other components within normal limits  ETHANOL - Abnormal; Notable for the following components:   Alcohol, Ethyl (B) 311 (*)    All other components within normal limits  LACTIC ACID, PLASMA - Abnormal; Notable for the following components:   Lactic Acid, Venous 4.1 (*)    All other components within normal limits  LACTIC ACID, PLASMA - Abnormal; Notable for the following components:   Lactic Acid, Venous 4.2 (*)    All other components within normal limits  MAGNESIUM  - Abnormal; Notable for the following components:   Magnesium  1.6 (*)    All other components within normal limits  LACTIC ACID, PLASMA - Abnormal; Notable for the following components:   Lactic Acid, Venous 3.8 (*)    All other components within normal limits  I-STAT CHEM 8, ED - Abnormal; Notable for the following components:   Sodium 129 (*)    Potassium 3.0 (*)    Chloride 83 (*)    BUN <3 (*)    Glucose, Bld 113 (*)    Calcium, Ion 0.97 (*)    All other components within normal limits  LIPASE, BLOOD  BASIC METABOLIC PANEL WITH GFR  BASIC METABOLIC PANEL WITH GFR  BASIC METABOLIC PANEL WITH GFR  BASIC METABOLIC PANEL WITH GFR  TROPONIN I (HIGH SENSITIVITY)  TROPONIN I (HIGH SENSITIVITY)    EKG: EKG Interpretation Date/Time:  Monday January 25 2024 22:39:52 EDT Ventricular Rate:  98 PR Interval:  174 QRS Duration:  96 QT Interval:  356 QTC Calculation: 455 R Axis:   60  Text Interpretation: Sinus rhythm Low voltage, precordial leads Confirmed by Griselda Norris [phone]) on 01/26/2024 12:01:00 AM  Radiology: [facility] Chest 2 View Result Date: 01/25/2024 CLINICAL DATA:  Three day history of chest pain and shortness of breath chest EXAM: CHEST - 2 VIEW COMPARISON:  Radiograph dated 01/29/2014 FINDINGS: Normal lung volumes. Patchy medial right basilar opacity. No pleural effusion or pneumothorax. The heart size and mediastinal contours are within normal limits. No acute osseous abnormality. IMPRESSION: Patchy medial right basilar opacity, which  may represent atelectasis or pneumonia. Electronically Signed   By: Limin  Xu M.D.   On: 01/25/2024 21:17     .Critical Care  Performed by: Nyron Mozer A, PA-C Authorized by: Kamea Dacosta A, PA-C   Critical care provider statement:    Critical care time (minutes):  77   Critical care start time:  01/25/2024 11:00 PM   Critical care end time:  01/26/2024 12:17 AM   Critical care time was exclusive of:  Separately billable procedures and treating other patients   Critical care was necessary to treat or prevent imminent or life-threatening deterioration of the following conditions:  Metabolic crisis   Critical care was time spent personally by me on the following activities:  Blood draw for specimens, development of treatment plan with patient or surrogate, discussions with consultants, evaluation of patient's response to treatment, obtaining history from patient or surrogate, ordering and review of laboratory studies, ordering and review of radiographic studies and ordering and performing treatments and interventions   I assumed direction of critical care for this patient from another provider in my specialty: no     Care discussed with: admitting provider      Medications Ordered in the ED  LORazepam  (ATIVAN ) tablet 1-4 mg ( Oral See Alternative 01/25/24 2249)    Or  LORazepam  (ATIVAN ) injection 1-4 mg (2 mg Intravenous Given 01/25/24 2249)  thiamine  (VITAMIN B1) tablet 100 mg (has no administration in time range)     Or  thiamine  (VITAMIN B1) injection 100 mg (has no administration in time range)  folic acid  (FOLVITE ) tablet 1 mg (has no administration in time range)  multivitamin with minerals tablet 1 tablet (has no administration in time range)  0.9 %  sodium chloride  infusion ( Intravenous New Bag/Given 01/26/24 0345)  lactated ringers  bolus 1,000 mL (0 mLs Intravenous Stopped 01/26/24 0110)  potassium chloride  10 mEq in 100 mL IVPB (0 mEq Intravenous Stopped 01/26/24 0205)  ondansetron  (ZOFRAN ) injection 4 mg (4 mg Intravenous Given 01/25/24 2354)  magnesium  sulfate IVPB 2 g 50 mL (0 g Intravenous Stopped 01/26/24 0110)                                    Medical Decision Making Amount and/or Complexity of Data Reviewed Labs: ordered.  Risk OTC drugs. Prescription drug management. Decision regarding hospitalization.   This patient presents to the ED for concern of diarrhea, vomiting, chest pain, this involves an extensive number of treatment options, and is a complaint that carries with it a high risk of complications and morbidity.  The differential diagnosis includes alcohol withdrawal, hypokalemia, metabolic encephalopathy, bowel obstruction, diverticulitis, gastroenteritis   Co morbidities that complicate the patient evaluation  Alcohol abuse, hypertension, hypokalemia, hyponatremia, hypomagnesemia   Lab Tests:  I Ordered, and personally interpreted labs.  The pertinent results include: CBC largely unremarkable, CMP with hyponatremia 125, hypokalemia 2.8, hypochloremia 81, magnesium  low at 1.6, troponin 6, lipase unremarkable 49, lactic acid elevated 4.1 likely secondary to vomiting and diarrhea, repeat lactic acid elevated at 4.2 with a level beginning to downtrend at 3.8.  Hold is likely elevated due to alcohol use.  Ethanol elevated at 311.   Imaging Studies ordered:  I ordered imaging studies including chest x-ray I independently visualized and interpreted imaging which showed patchy  medial right basilar opacity, which may represent atelectasis or pneumonia.Patchy medial right basilar opacity, which may represent atelectasis or pneumonia.  I agree with the radiologist interpretation   Cardiac Monitoring: / EKG:  The patient was maintained on a cardiac monitor.  I personally viewed and interpreted the cardiac monitored which showed an underlying rhythm of: Sinus rhythm borderline sinus tachycardia   Consultations Obtained:  I requested consultation with the hospitalist,  and discussed lab and imaging findings as well as pertinent plan - they recommend: Dr. Sundil, admitting for electrolyte derangements likely secondary to alcohol use.   Problem List / ED Course / Critical interventions / Medication management  Patient with past history significant for alcohol dependence, alcohol withdrawal presents ED with concerns of nausea, vomiting, diarrhea.  He endorses about 3 to 4 days of diarrhea, vomiting, and central chest pain.  He is concerned for hypokalemia as he fairly consistently runs slightly low in his potassium.  Denies any hematemesis, medic easier, melanotic stools.  Not on blood thinners. On exam, patient does not appear to be acutely withdrawing.  Nurse obtained CIWA scoring which currently measures a 6.  No other acute or focal abnormality seen on physical exam such as abnormal heart or lung sounds, focal abdominal tenderness or guarding. Lab workup is concerning for significant electrolyte derangement CMP showing hypokalemia at 2.8, hyponatremia at 125, hypochloremia at 81.  Given severity of these levels and patient being acutely intoxicated with ethanol at 311, concerned that patient will likely have worsening of these as he begins to withdraw from alcohol.  Will consult social admission.  Lactic acid elevated but again suspect this is likely due to alcohol use and doubt sepsis. Spoke with Dr. Sundil, hospitalist, who will be admitting patient. I ordered medication  including potassium, fluids, magnesium , Zofran , Ativan  for hypokalemia, nutrition, hypomagnesemia, nausea, alcohol withdrawal Reevaluation of the patient after these medicines showed that the patient improved I have reviewed the patients home medicines and have made adjustments as needed   Social Determinants of Health:  Alcohol use disorder   Test / Admission - Considered:  Patient being admitted.  Final diagnoses:  Alcohol abuse  Hypokalemia  Hyponatremia  Hypomagnesemia    ED Discharge Orders     None          Cecily Legrand LABOR, PA-C 01/26/24 0507    Griselda Norris, MD 01/26/24 [phone]

## 2024-01-26 ENCOUNTER — Encounter (HOSPITAL_COMMUNITY): Payer: Self-pay | Admitting: Internal Medicine

## 2024-01-26 DIAGNOSIS — E876 Hypokalemia: Secondary | ICD-10-CM | POA: Diagnosis present

## 2024-01-26 DIAGNOSIS — K760 Fatty (change of) liver, not elsewhere classified: Secondary | ICD-10-CM | POA: Insufficient documentation

## 2024-01-26 DIAGNOSIS — D693 Immune thrombocytopenic purpura: Secondary | ICD-10-CM | POA: Insufficient documentation

## 2024-01-26 DIAGNOSIS — R7401 Elevation of levels of liver transaminase levels: Secondary | ICD-10-CM | POA: Insufficient documentation

## 2024-01-26 DIAGNOSIS — W1830XA Fall on same level, unspecified, initial encounter: Secondary | ICD-10-CM | POA: Diagnosis present

## 2024-01-26 DIAGNOSIS — F411 Generalized anxiety disorder: Secondary | ICD-10-CM | POA: Diagnosis present

## 2024-01-26 DIAGNOSIS — E871 Hypo-osmolality and hyponatremia: Secondary | ICD-10-CM | POA: Diagnosis present

## 2024-01-26 DIAGNOSIS — Z7984 Long term (current) use of oral hypoglycemic drugs: Secondary | ICD-10-CM | POA: Diagnosis not present

## 2024-01-26 DIAGNOSIS — R0789 Other chest pain: Secondary | ICD-10-CM | POA: Diagnosis present

## 2024-01-26 DIAGNOSIS — Y9301 Activity, walking, marching and hiking: Secondary | ICD-10-CM | POA: Diagnosis present

## 2024-01-26 DIAGNOSIS — F1090 Alcohol use, unspecified, uncomplicated: Secondary | ICD-10-CM

## 2024-01-26 DIAGNOSIS — Z9181 History of falling: Secondary | ICD-10-CM | POA: Diagnosis not present

## 2024-01-26 DIAGNOSIS — Z880 Allergy status to penicillin: Secondary | ICD-10-CM | POA: Diagnosis not present

## 2024-01-26 DIAGNOSIS — R197 Diarrhea, unspecified: Secondary | ICD-10-CM | POA: Diagnosis present

## 2024-01-26 DIAGNOSIS — Y92230 Patient room in hospital as the place of occurrence of the external cause: Secondary | ICD-10-CM | POA: Diagnosis present

## 2024-01-26 DIAGNOSIS — Z79899 Other long term (current) drug therapy: Secondary | ICD-10-CM | POA: Diagnosis not present

## 2024-01-26 DIAGNOSIS — I10 Essential (primary) hypertension: Secondary | ICD-10-CM | POA: Diagnosis present

## 2024-01-26 DIAGNOSIS — Y908 Blood alcohol level of 240 mg/100 ml or more: Secondary | ICD-10-CM | POA: Diagnosis present

## 2024-01-26 DIAGNOSIS — F10930 Alcohol use, unspecified with withdrawal, uncomplicated: Secondary | ICD-10-CM | POA: Diagnosis not present

## 2024-01-26 DIAGNOSIS — J45909 Unspecified asthma, uncomplicated: Secondary | ICD-10-CM | POA: Diagnosis present

## 2024-01-26 DIAGNOSIS — F17210 Nicotine dependence, cigarettes, uncomplicated: Secondary | ICD-10-CM | POA: Insufficient documentation

## 2024-01-26 DIAGNOSIS — R7303 Prediabetes: Secondary | ICD-10-CM | POA: Diagnosis present

## 2024-01-26 DIAGNOSIS — E872 Acidosis, unspecified: Secondary | ICD-10-CM | POA: Diagnosis present

## 2024-01-26 DIAGNOSIS — E860 Dehydration: Secondary | ICD-10-CM | POA: Diagnosis present

## 2024-01-26 DIAGNOSIS — R296 Repeated falls: Secondary | ICD-10-CM | POA: Diagnosis present

## 2024-01-26 DIAGNOSIS — F10939 Alcohol use, unspecified with withdrawal, unspecified: Secondary | ICD-10-CM | POA: Diagnosis present

## 2024-01-26 DIAGNOSIS — F1290 Cannabis use, unspecified, uncomplicated: Secondary | ICD-10-CM | POA: Insufficient documentation

## 2024-01-26 DIAGNOSIS — F10231 Alcohol dependence with withdrawal delirium: Secondary | ICD-10-CM | POA: Diagnosis present

## 2024-01-26 DIAGNOSIS — J4520 Mild intermittent asthma, uncomplicated: Secondary | ICD-10-CM

## 2024-01-26 DIAGNOSIS — F10931 Alcohol use, unspecified with withdrawal delirium: Secondary | ICD-10-CM | POA: Diagnosis present

## 2024-01-26 DIAGNOSIS — F1010 Alcohol abuse, uncomplicated: Secondary | ICD-10-CM | POA: Diagnosis not present

## 2024-01-26 DIAGNOSIS — Z781 Physical restraint status: Secondary | ICD-10-CM | POA: Diagnosis not present

## 2024-01-26 DIAGNOSIS — R112 Nausea with vomiting, unspecified: Secondary | ICD-10-CM | POA: Insufficient documentation

## 2024-01-26 LAB — BASIC METABOLIC PANEL WITH GFR
Anion gap: 14 (ref 5–15)
Anion gap: 14 (ref 5–15)
BUN: <5 mg/dL — ABNORMAL LOW (ref 6–20)
BUN: <5 mg/dL — ABNORMAL LOW (ref 6–20)
CO2: 28 mmol/L (ref 22–32)
CO2: 29 mmol/L (ref 22–32)
Calcium: 8.4 mg/dL — ABNORMAL LOW (ref 8.9–10.3)
Calcium: 8.4 mg/dL — ABNORMAL LOW (ref 8.9–10.3)
Chloride: 88 mmol/L — ABNORMAL LOW (ref 98–111)
Chloride: 87 mmol/L — ABNORMAL LOW (ref 98–111)
Creatinine, Ser: 0.71 mg/dL (ref 0.61–1.24)
Creatinine, Ser: 0.56 mg/dL — ABNORMAL LOW (ref 0.61–1.24)
GFR, Estimated: >60 mL/min (ref >60)
GFR, Estimated: >60 mL/min (ref >60)
Glucose, Bld: 107 mg/dL — ABNORMAL HIGH (ref 70–99)
Glucose, Bld: 121 mg/dL — ABNORMAL HIGH (ref 70–99)
Potassium: 3.5 mmol/L (ref 3.5–5.1)
Potassium: 3.3 mmol/L — ABNORMAL LOW (ref 3.5–5.1)
Sodium: 130 mmol/L — ABNORMAL LOW (ref 135–145)
Sodium: 130 mmol/L — ABNORMAL LOW (ref 135–145)

## 2024-01-26 LAB — HEPATITIS PANEL, ACUTE
HCV Ab: NONREACTIVE
Hep A IgM: NONREACTIVE
Hep B C IgM: NONREACTIVE
Hepatitis B Surface Ag: NONREACTIVE

## 2024-01-26 LAB — OSMOLALITY: Osmolality: 310 mosm/kg — ABNORMAL HIGH (ref 275–295)

## 2024-01-26 LAB — MAGNESIUM: Magnesium: 1.8 mg/dL (ref 1.7–2.4)

## 2024-01-26 LAB — LACTIC ACID, PLASMA
Lactic Acid, Venous: 3.8 mmol/L (ref 0.5–1.9)
Lactic Acid, Venous: 3.2 mmol/L (ref 0.5–1.9)
Lactic Acid, Venous: 2.9 mmol/L (ref 0.5–1.9)

## 2024-01-26 MED ORDER — SODIUM CHLORIDE 0.9 % IV SOLN: INTRAVENOUS | Status: AC

## 2024-01-26 MED ORDER — SODIUM CHLORIDE 0.9% FLUSH: 3.0000 mL | INTRAVENOUS | Status: DC | PRN

## 2024-01-26 MED ORDER — ONDANSETRON HCL 4 MG/2ML IJ SOLN
4.0000 mg | Freq: Four times a day (QID) | INTRAMUSCULAR | Status: DC | PRN
Start: 2024-01-26 — End: 2024-02-02
  Administered 2024-01-26 – 2024-01-28 (×3): 4 mg via INTRAVENOUS
  Filled 2024-01-26 (×4): qty 2

## 2024-01-26 MED ORDER — IPRATROPIUM-ALBUTEROL 0.5-2.5 (3) MG/3ML IN SOLN: 3.0000 mL | Freq: Four times a day (QID) | RESPIRATORY_TRACT | Status: DC | PRN

## 2024-01-26 MED ORDER — ACETAMINOPHEN 325 MG PO TABS: 650.0000 mg | ORAL_TABLET | Freq: Four times a day (QID) | ORAL | Status: DC | PRN

## 2024-01-26 MED ORDER — ACETAMINOPHEN 650 MG RE SUPP: 650.0000 mg | Freq: Four times a day (QID) | RECTAL | Status: DC | PRN

## 2024-01-26 MED ORDER — SODIUM CHLORIDE 0.9% FLUSH
3.0000 mL | Freq: Two times a day (BID) | INTRAVENOUS | Status: DC
  Administered 2024-01-26 – 2024-02-01 (×11): 3 mL via INTRAVENOUS

## 2024-01-26 MED ORDER — SODIUM CHLORIDE 0.9% FLUSH
3.0000 mL | Freq: Two times a day (BID) | INTRAVENOUS | Status: DC
  Administered 2024-01-26 – 2024-01-29 (×4): 3 mL via INTRAVENOUS

## 2024-01-26 MED ORDER — PHENOBARBITAL 97.2 MG PO TABS
97.2000 mg | ORAL_TABLET | Freq: Three times a day (TID) | ORAL | Status: AC
  Administered 2024-01-26 – 2024-01-28 (×6): 97.2 mg via ORAL
  Filled 2024-01-26 (×6): qty 3

## 2024-01-26 MED ORDER — FLUOXETINE HCL 20 MG PO CAPS: 40.0000 mg | ORAL_CAPSULE | Freq: Every day | ORAL | Status: DC

## 2024-01-26 MED ORDER — NICOTINE 21 MG/24HR TD PT24
21.0000 mg | MEDICATED_PATCH | Freq: Every day | TRANSDERMAL | Status: DC
  Administered 2024-01-26 – 2024-02-01 (×6): 21 mg via TRANSDERMAL
  Filled 2024-01-26 (×6): qty 1

## 2024-01-26 MED ORDER — CARVEDILOL 6.25 MG PO TABS
6.2500 mg | ORAL_TABLET | Freq: Two times a day (BID) | ORAL | Status: DC
  Administered 2024-01-26 – 2024-02-02 (×15): 6.25 mg via ORAL
  Filled 2024-01-26 (×15): qty 1

## 2024-01-26 MED ORDER — PHENOBARBITAL 32.4 MG PO TABS
64.8000 mg | ORAL_TABLET | Freq: Three times a day (TID) | ORAL | Status: DC
  Administered 2024-01-28 – 2024-01-29 (×4): 64.8 mg via ORAL
  Filled 2024-01-26 (×5): qty 2

## 2024-01-26 MED ORDER — GUAIFENESIN ER 600 MG PO TB12
600.0000 mg | ORAL_TABLET | Freq: Two times a day (BID) | ORAL | Status: DC
  Administered 2024-01-26 – 2024-02-02 (×14): 600 mg via ORAL
  Filled 2024-01-26 (×14): qty 1

## 2024-01-26 MED ORDER — ONDANSETRON HCL 4 MG PO TABS
4.0000 mg | ORAL_TABLET | Freq: Four times a day (QID) | ORAL | Status: DC | PRN
  Administered 2024-01-27: 4 mg via ORAL
  Filled 2024-01-26: qty 1

## 2024-01-26 MED ORDER — BISMUTH SUBSALICYLATE 262 MG/15ML PO SUSP: 30.0000 mL | ORAL | Status: DC | PRN

## 2024-01-26 MED ORDER — ORAL CARE MOUTH RINSE: 15.0000 mL | OROMUCOSAL | Status: DC | PRN

## 2024-01-26 MED ORDER — PHENOBARBITAL 32.4 MG PO TABS: 32.4000 mg | ORAL_TABLET | Freq: Three times a day (TID) | ORAL | Status: DC

## 2024-01-26 MED ORDER — SODIUM CHLORIDE 0.9 % IV SOLN: 250.0000 mL | INTRAVENOUS | Status: AC | PRN

## 2024-01-26 MED ORDER — PANTOPRAZOLE SODIUM 40 MG PO TBEC
40.0000 mg | DELAYED_RELEASE_TABLET | Freq: Every day | ORAL | Status: DC
  Administered 2024-01-26 – 2024-01-29 (×4): 40 mg via ORAL
  Filled 2024-01-26 (×4): qty 1

## 2024-01-26 MED ORDER — ENOXAPARIN SODIUM 40 MG/0.4ML IJ SOSY: 40.0000 mg | PREFILLED_SYRINGE | INTRAMUSCULAR | Status: DC

## 2024-01-26 NOTE — Plan of Care (Signed)
   Problem: Education: Goal: Knowledge of General Education information will improve Description Including pain rating scale, medication(s)/side effects and non-pharmacologic comfort measures Outcome: Progressing

## 2024-01-26 NOTE — Progress Notes (Signed)
 Progress Note   Patient: Austin Brown FMW:[id number] DOB: [birth year] DOA: 01/25/2024     0 DOS: the patient was seen and examined on 01/26/2024   Brief hospital course: 53yo with h/o HTN, reactive airway disease, marijuana use, tobacco use, and chronic ETOH use who presented on 7/7 with abdominal pain.  He was thought to be in ETOH withdrawal.   Assessment and Plan:  Alcohol withdrawal Presented with n/v/d x 4 days with epigastric discomfort Reports drinking 10 malt beers which contain 10% alcohol daily Initially has been tried to tolerate the alcohol withdrawal at home however due to extensive jitteriness and tremor with associated nausea, vomiting and diarrhea he started drinking again Last drink 7/7 Reports that he has been to [facility] twice, knows that he will die if he doesn't stop drinking BAL 311 with active withdrawal symptoms Continue CIWA and Ativan  as needed Continue folic acid , thiamine  and multivitamin Remains in active withdrawal, will add phenobarbital  (pharmacy dosing) GI pathogen panel ordered but low current suspicion for infectious etiology (symptoms likely related to withdrawal)   Hyponatremia Initial sodium was 125 which has been improved to 130 with maintenance fluid NS Likely related to beer proteinemia + dehydration in the setting of nausea, vomiting and diarrhea Continue maintenance fluid NS 75 cc/h   Hypokalemia/hypomagnesemia In the setting of alcohol and loose stools Replete as needed   Lactic acidosis-type B Initial lactic acid 4.1, improving with IVF Type B lactic acidosis in the setting of poor hepatic clearance from underlying hepatic steatosis and chronic alcohol use related transaminitis as well as metformin use Sepsis is not considered to be an issue currently    Generalized anxiety disorder Holding fluoxetine  in the setting of hyponatremia   Transaminitis/Hepatic steatosis CMP showed elevated AST 151, ALT 49 and ALP 144, Bilirubin  1.3 on presentation Transaminitis in the setting of hepatic steatosis and chronic alcohol use Negative hepatitis panel Check CMP in AM   Essential hypertension Continue carvedilol    History of prediabetes Holding metformin in setting of lactic acidosis Recent A1c 4.9 so perhaps this should not be resumed at the time of discharge     Chronic smoking cigarette PRN albuterol  inhaler use at home Continues to smoke cigarette on daily basis Continue nicotine  patch Starting DuoNeb nebulizer as needed  Marijuana use Patient acknowledged on presentation UDS pending     Consultants: [facility] team  Procedures: None  Antibiotics: None     Subjective: Mildly tremulous, anxious. No specific complaints.  Physical Exam: Vitals:   01/25/24 2300 01/26/24 0110 01/26/24 0200 01/26/24 0615  BP: 123/82  (!) 132/90 (!) 167/100  Pulse: 98  100 (!) 102  Resp: 16  13 13   Temp:  98 F (36.7 C)  98 F (36.7 C)  SpO2: 100%  100% 92%  Weight:      Height:        Intake/Output Summary (Last 24 hours) at 01/26/2024 0826 Last data filed at 01/26/2024 0205 Gross per 24 hour  Intake 1150 ml  Output --  Net 1150 ml   Filed Weights   01/25/24 2026  Weight: 95.4 kg    Exam:  General:  Appears chronically ill Eyes:  PERRL, EOMI, normal lids, iris ENT:  grossly normal hearing, lips & tongue, mmm Cardiovascular:  RRR.  No LE edema.  Respiratory:   CTA bilaterally with no wheezes/rales/rhonchi.  Normal respiratory effort. Abdomen:  soft, NT, ND Back:   normal alignment, no CVAT Skin:  no rash or induration  seen on limited exam Musculoskeletal:  grossly normal tone BUE/BLE, good ROM, no bony abnormality Psychiatric:  mildly anxious mood and affect, speech fluent and appropriate, AOx3 Neurologic:  CN 2-12 grossly intact, moves all extremities in coordinated fashion  Data Reviewed: I have reviewed the patient's lab results since admission.  Pertinent labs for today  include:   Na++ 130, up from 125 Glucose 107 Lactate 4.1, 4.3, 3.8, 3.2 ETOH 311    Family Communication: None present  Disposition: Status is: Inpatient Admit - It is my clinical opinion that admission to INPATIENT is reasonable and necessary because of the expectation that this patient will require hospital care that crosses at least 2 midnights to treat this condition based on the medical complexity of the problems presented.  Given the aforementioned information, the predictability of an adverse outcome is felt to be significant.    Planned Discharge Destination: Home    Time spent: 50 minutes  Author: Delon Herald, MD 01/26/2024 8:21 AM  For on call review [email or web address].

## 2024-01-26 NOTE — H&P (Signed)
 History and Physical    Austin Brown FMW:[id number] DOB: [birth year] DOA: 01/25/2024  PCP: Clarice Nottingham, MD   Patient coming from: Home   Chief Complaint:  Chief Complaint  Patient presents with   Diarrhea   Chest Pain   ED TRIAGE note:  Pt reports 4 days of diarrhea and chest pain. Pt states I know my potassium is probably low.       HPI:  53 year old man medical history of asthma, essential hypertension, reactive airway disease, marijuana smoking, cigarette smoking, chronic alcohol use and chronic thrombocytopenia presented emergency department complaining of nausea, vomiting, diarrhea and abdominal discomfort. Last drink of alcohol today in the afternoon. Reports symptoms ongoing for last 4 days with epigastric discomfort. Believes his potassium is likely low and took 2 potassium tablets at home. Believes that his symptoms are likely due to alcohol withdrawal although endorses that her last drink was around 3 or 4 PM this afternoon.Typically drinks 6-7 malt beers that are 10% alcohol by weight. Denies any recent substance use beyond a little bit of marijuana. He believes his abdominal pain is likely from the vomiting that he has been experiencing. Denies any recent fever, chills or bodyaches. Patient is also complaining about mid central chest pain.  Reported he feels chest pain with cough.  Has been smoking cigarette and marijuana.    At presentation to ED patient is hemodynamically stable. Elevated lactic acid 3.8.  Low mag level 1.6.  Blood alcohol level 311.  Normal lipase level 49.  CMP showing low sodium 125, low potassium 2.8, low chloride 81, elevated AST/ALT ALP 151/49/144/1.3. CBC unremarkable except low platelet count 136 at baseline. Troponin x 2 within normal range. EKG showed normal sinus rhythm heart rate 98, low voltage precordial leads. Chest x-ray no active disease process. In the ED CIWA protocol and Ativan  prn has been initiated.  Also patient being started  on any 75 cc/h.  Hospitalist has consulted for further management of alcohol withdrawal, nausea/vomiting/diarrhea, hyponatremia, hypokalemia, transaminitis and lactic acidosis-type B.     Significant labs in the ED: Lab Orders         Gastrointestinal Panel by PCR , Stool         CBC with Differential         Comprehensive metabolic panel         Ethanol         Lipase, blood         Lactic acid, plasma         Magnesium          Lactic acid, plasma         Basic metabolic panel         CBC         Hepatitis panel, acute         Rapid urine drug screen (hospital performed)         Lactic acid, plasma         Magnesium          Osmolality, urine         Sodium, urine, random         Osmolality         I-stat chem 8, ED (not at [facility], [facility] or [facility])       Review of Systems:  Review of Systems  Constitutional:  Negative for chills, fever, malaise/fatigue and weight loss.  Respiratory:  Positive for cough. Negative for sputum production, shortness of breath and wheezing.   Cardiovascular:  Negative for chest pain, palpitations and leg swelling.  Gastrointestinal:  Positive for diarrhea, nausea and vomiting. Negative for abdominal pain, constipation and heartburn.  Genitourinary:  Negative for dysuria, frequency and urgency.  Musculoskeletal:  Negative for back pain, joint pain and myalgias.  Neurological:  Positive for tremors. Negative for dizziness and headaches.  Psychiatric/Behavioral:  Negative for hallucinations and memory loss. The patient is not nervous/anxious and does not have insomnia.     Past Medical History:  Diagnosis Date   Asthma    Hypertension     Past Surgical History:  Procedure Laterality Date   JOINT REPLACEMENT       reports that he has been smoking cigarettes. He uses smokeless tobacco. He reports current alcohol use of about 84.0 standard drinks of alcohol per week. He reports current drug use. Drug: Marijuana.  Allergies  Allergen Reactions    Amoxicillin Other (See Comments)    Reaction not cited    Family History  Problem Relation Age of Onset   Heart disease Neg Hx     Prior to Admission medications   Medication Sig Start Date End Date Taking? Authorizing Provider  albuterol  (VENTOLIN  HFA) 108 (90 Base) MCG/ACT inhaler Inhale 2 puffs into the lungs every 6 (six) hours as needed for wheezing or shortness of breath. 05/31/21  Yes [provider]  carvedilol  (COREG ) 6.25 MG tablet Take 6.25 mg by mouth 2 (two) times daily. 07/27/23  Yes [provider]  feeding supplement (ENSURE ENLIVE / ENSURE PLUS) LIQD Take 237 mLs by mouth 2 (two) times daily between meals. Patient taking differently: Take 237 mLs by mouth daily. 08/04/23  Yes Sheikh, Omair Latif, DO  FLUoxetine  (PROZAC ) 40 MG capsule Take 40 mg by mouth daily. 01/06/23  Yes [provider]  metFORMIN (GLUCOPHAGE) 500 MG tablet Take 500 mg by mouth daily. 03/31/23  Yes [provider]  Potassium Chloride  ER 20 MEQ TBCR Take 1 tablet by mouth daily. 11/26/23  Yes [provider]  traZODone  (DESYREL ) 100 MG tablet Take 100 mg by mouth at bedtime as needed for sleep. 10/14/21  Yes [provider]  nicotine  (NICODERM CQ  - DOSED IN MG/24 HOURS) 21 mg/24hr patch Place 1 patch (21 mg total) onto the skin daily. Patient not taking: Reported on 01/26/2024 11/10/21   Madelyne Owen LABOR, MD     Physical Exam: Vitals:   01/25/24 2249 01/25/24 2300 01/26/24 0110 01/26/24 0200  BP: 125/83 123/82  (!) 132/90  Pulse:  98  100  Resp:  16  13  Temp:   98 F (36.7 C)   SpO2:  100%  100%  Weight:      Height:        Physical Exam Constitutional:      Appearance: He is well-developed. He is ill-appearing and diaphoretic.  Cardiovascular:     Rate and Rhythm: Normal rate and regular rhythm.     Heart sounds: Normal heart sounds. No murmur heard. Pulmonary:     Effort: Pulmonary effort is normal.     Breath sounds: Normal breath sounds.  No decreased breath sounds, wheezing or rhonchi.  Abdominal:     Palpations: Abdomen is soft.     Tenderness: There is no abdominal tenderness.  Skin:    General: Skin is warm.     Capillary Refill: Capillary refill takes less than 2 seconds.  Neurological:     Mental Status: He is alert and oriented to person, place, and time.  Psychiatric:  Mood and Affect: Mood is anxious.        Behavior: Behavior is not agitated.      Labs on Admission: I have personally reviewed following labs and imaging studies  CBC: Recent Labs  Lab 01/25/24 2104 01/25/24 2112  WBC 8.0  --   NEUTROABS 4.9  --   HGB 16.2 17.0  HCT 44.7 50.0  MCV 99.3  --   PLT 136*  --    Basic Metabolic Panel: Recent Labs  Lab 01/25/24 2104 01/25/24 2112 01/25/24 2250 01/26/24 0435  NA 125* 129*  --  130*  K 2.8* 3.0*  --  3.5  CL 81* 83*  --  88*  CO2 27  --   --  28  GLUCOSE 111* 113*  --  107*  BUN <5* <3*  --  <5*  CREATININE 0.64 1.10  --  0.71  CALCIUM 8.4*  --   --  8.4*  MG  --   --  1.6*  --    GFR: Estimated Creatinine Clearance: 125.3 mL/min (by C-G formula based on SCr of 0.71 mg/dL). Liver Function Tests: Recent Labs  Lab 01/25/24 2104  AST 151*  ALT 49*  ALKPHOS 144*  BILITOT 1.3*  PROT 7.4  ALBUMIN 3.7   Recent Labs  Lab 01/25/24 2104  LIPASE 49   No results for input(s): AMMONIA in the last 168 hours. Coagulation Profile: No results for input(s): INR, PROTIME in the last 168 hours. Cardiac Enzymes: Recent Labs  Lab 01/25/24 2104 01/25/24 2250  TROPONINIHS 6 6   BNP (last 3 results) No results for input(s): BNP in the last 8760 hours. HbA1C: No results for input(s): HGBA1C in the last 72 hours. CBG: No results for input(s): GLUCAP in the last 168 hours. Lipid Profile: No results for input(s): CHOL, HDL, LDLCALC, TRIG, CHOLHDL, LDLDIRECT in the last 72 hours. Thyroid  Function Tests: No results for input(s): TSH, T4TOTAL,  FREET4, T3FREE, THYROIDAB in the last 72 hours. Anemia Panel: No results for input(s): VITAMINB12, FOLATE, FERRITIN, TIBC, IRON, RETICCTPCT in the last 72 hours. Urine analysis:    Component Value Date/Time   COLORURINE YELLOW 11/06/2021 2054   APPEARANCEUR CLEAR 11/06/2021 2054   LABSPEC 1.008 11/06/2021 2054   PHURINE 7.5 11/06/2021 2054   GLUCOSEU NEGATIVE 11/06/2021 2054   HGBUR NEGATIVE 11/06/2021 2054   BILIRUBINUR NEGATIVE 11/06/2021 2054   KETONESUR NEGATIVE 11/06/2021 2054   PROTEINUR NEGATIVE 11/06/2021 2054   UROBILINOGEN 0.2 08/27/2014 2131   NITRITE NEGATIVE 11/06/2021 2054   LEUKOCYTESUR NEGATIVE 11/06/2021 2054    Radiological Exams on Admission: I have personally reviewed images DG Chest 2 View Result Date: 01/25/2024 CLINICAL DATA:  Three day history of chest pain and shortness of breath chest EXAM: CHEST - 2 VIEW COMPARISON:  Radiograph dated 01/29/2014 FINDINGS: Normal lung volumes. Patchy medial right basilar opacity. No pleural effusion or pneumothorax. The heart size and mediastinal contours are within normal limits. No acute osseous abnormality. IMPRESSION: Patchy medial right basilar opacity, which may represent atelectasis or pneumonia. Electronically Signed   By: Limin  Xu M.D.   On: 01/25/2024 21:17     EKG: My personal interpretation of EKG shows: EKG showed normal sinus rhythm heart rate 98, low voltage precordial leads.    Assessment/Plan: Principal Problem:   Alcohol withdrawal (HCC) Active Problems:   Chronic alcohol use   Lactic acidosis   Hyponatremia   Hypokalemia   Nausea and vomiting   Diarrhea   Chronic idiopathic thrombocytopenia (HCC)  Hepatic steatosis   Generalized anxiety disorder   Transaminitis   Marijuana smoker, episodic   Continuous dependence on cigarette smoking   Reactive airway disease    Assessment and Plan: Alcohol withdrawal - Presented emergency department complaining of nausea, vomiting and  diarrhea for last 4 days with epigastric discomfort.  Patient reported that he has been drinking 6-8 malt beers which contain 10% alcohol.  Initially has been tried to tolerate the alcohol withdrawal at home however due to extensive jitteriness and tremor with associated nausea, vomiting and diarrhea he started drinking again.  Last drink yesterday around 3-4 PM 7/7.  Patient denies any fever, chill and myalgia.  Denies any upper respiratory tract or UTI symptoms - At presentation to ED patient was tachycardic and borderline hypotensive.  Blood pressure has been improved with fluid resuscitation. - Blood alcohol level was 311.  Patient also reported marijuana use.  Checking UDS. - CMP showed low sodium 135, low potassium 2.8, low chloride 81, low BUN 5, elevated AST/ALT/ALP 151/49/144, elevated bilirubin 1.3 and elevated anion gap 17.  Normal lipase level 49.  CBC unremarkable except chronically low platelet 136.  Low mag level 1.6. -In the ED potassium and magnesium  has been repleted.  Patient also being started on NS 75 cc/h. - Continue CIWA and Ativan  as needed. - Continue folic acid , thiamine  and multivitamin. -Continue cardiac monitoring. -Counseled patient at bedside for alcohol cessation.   Hyponatremia -Initial sodium was 125 which has been improved to 130 with maintenance fluid NS. Hyponatremia secondary to beer proteinemia as well as in the setting of nausea, vomiting and diarrhea.  As well as combination of Zoloft. - Holding Zoloft.  -Continue maintenance fluid NS 75 cc/h. -Goal to improve serum sodium level 8 to 10 mEq over the course of next 24 hours.  Hypokalemia Hypokalemia in the setting of alcohol and loose stool. -Low potassium 2.8 has been improved to 3.5 with IV and oral KCl supplementation.  Hypomagnesemia - Low back 1.6.  In the ED patient has been repleted with IV mag sulfate.  Nausea, vomiting and diarrhea -Patient reported nausea, vomiting and diarrhea with central  chest pain for 3 to 4 days.  Reported known biliary nonbloody vomiting.  Has 1-2 episodes of loose stool on daily basis. Concern for nausea, vomiting and diarrhea in setting of marijuana use, alcohol withdrawal.  Patient is afebrile.  No evidence of leukocytosis.  Lactic acidosis secondary to poor hepatic clearance and type B lactic acidosis rather than elevation from any infectious source. -It is also possible that patient might have some viral gastroenteritis. - Checking GI panel.  Lactic acidosis-type B -Initial lactic acid 4.1 which has been improved to 3.8 after receiving 1 L of LR bolus in the ED.  Type B lactic acidosis in the setting of poor hepatic clearance from underlying hepatic steatosis and chronic alcohol use related transaminitis.  Also metformin use causing lactic acidosis as well. - Continue to trend lactic acid level.  If patient develops any fever, leukocytosis or signs of infection in that case we will go sepsis pathway.   Generalized anxiety disorder -Holding Zoloft in the setting of hyponatremia  Transaminitis Hepatic steatosis - CMP showed elevated AST 151, ALT 49 and ALP 144.  Per chart review patient has chronic AST elevation and hyperbilirubinemia.  Bilirubin 1.3 today. - Transaminitis in the setting of hepatic steatosis and chronic alcohol use. - Checking hepatitis panel.  Continue to monitor hepatic function.  Essential hypertension -Continue Coreg  6.25 mg twice  daily  Noncardiac chest pain - Patient is complaining about mid central chest pain.  Patient reported coughing causing the chest pain.  History of chronic smoking cigarette and reactive airway disease.  No established diagnosis of COPD. -Troponin x 2 within normal range.  EKG unremarkable.  At this time there is no concern for acute coronary syndrome.   -Noncardiac chest pain in the setting of pleural irritation from cough.   History of prediabetes - Holding metformin in setting of lactic acidosis.   Per chart review most recent A1c 4.9 in 09/2023.   History of prolonged QTc - EKG showing normal QTc interval at this time.  Chronic smoking cigarette History of reactive airway disease - Patient reported he used albuterol  inhaler as needed at home.  Continues to smoke cigarette on daily basis - Continue nicotine  patch. - Starting DuoNeb nebulizer as needed.   DVT prophylaxis: In the setting of recurrent vomiting high risk for development of Mallory-Weiss tear and GI bleed.  Will avoid pharmacological DVT prophylaxis.  Continue SCD and TED hose. Code Status:  Full Code Diet: Full liquid diet advance diet as patient tolerates without nausea or vomiting. Family Communication:   Family was present at bedside, at the time of interview. Opportunity was given to ask question and all questions were answered satisfactorily.  Disposition Plan: Continue to monitor improvement of alcohol withdrawal associated tremor.  Monitor improvement of electrolytes, nausea and vomiting. Consults: Transition care team Admission status:   Observation, Telemetry bed  Severity of Illness: The appropriate patient status for this patient is OBSERVATION. Observation status is judged to be reasonable and necessary in order to provide the required intensity of service to ensure the patient's safety. The patient's presenting symptoms, physical exam findings, and initial radiographic and laboratory data in the context of their medical condition is felt to place them at decreased risk for further clinical deterioration. Furthermore, it is anticipated that the patient will be medically stable for discharge from the hospital within 2 midnights of admission.     Hershy Flenner, MD [facility]  How to contact the [facility] Attending or Consulting provider 7A - 7P or covering provider during after hours 7P -7A, for this patient.  Check the care team in [facility] and look for a) attending/consulting [facility] provider listed and b) the  TRH team listed Log into [email or web address] and use [place]'s universal password to access. If you do not have the password, please contact the hospital operator. Locate the TR[facility] provider you are looking for under [facility] and page to a number that you can be directly reached. If you still have difficulty reaching the provider, please page the [facility] (Director on Call) for the Hospitalists listed on amion for assistance.  01/26/2024, 6:03 AM

## 2024-01-26 NOTE — Hospital Course (Addendum)
 52 year old man medical history of asthma, essential hypertension, chronic alcohol use and chronic thrombocytopenia presented emergency department complaining of nausea, vomiting and diarrhea. Last drink of alcohol today in the afternoon. Reports symptoms ongoing for last 4 days with epigastric discomfort. Believes his potassium is likely low and took 2 potassium tablets at home. Believes that his symptoms are likely due to alcohol withdrawal although endorses that her last drink was around 3 or 4 PM this afternoon.Typically drinks 6-7 malt beers that are 10% alcohol by weight. Denies any recent substance use beyond a little bit of marijuana. He believes his abdominal pain is likely from the vomiting that he has been experiencing. Denies any recent fever, chills or bodyaches.  At presentation to ED patient is hemodynamically stable. Elevated lactic acid 3.8.  Low mag level 1.6.  Blood alcohol level 311.  Normal lipase level 49.  CMP showing low sodium 125, low potassium 2.8, low chloride 81, elevated AST/ALT ALP 151/49/144/1.3. CBC unremarkable except low platelet count 136 at baseline.  Chest x-ray no active disease process. In the ED CIWA protocol and Ativan  prn has been initiated.  Also patient being started on any 75 cc/h.  Hospitalist has consulted for further management of alcohol withdrawal, nausea/vomiting/diarrhea, hyponatremia, hypokalemia and lactic acidosis-type B.

## 2024-01-26 NOTE — Plan of Care (Signed)

## 2024-01-27 DIAGNOSIS — F1010 Alcohol abuse, uncomplicated: Secondary | ICD-10-CM | POA: Diagnosis not present

## 2024-01-27 LAB — GASTROINTESTINAL PANEL BY PCR, STOOL (REPLACES STOOL CULTURE)

## 2024-01-27 LAB — COMPREHENSIVE METABOLIC PANEL WITH GFR
ALT: 35 U/L (ref 0–44)
AST: 96 U/L — ABNORMAL HIGH (ref 15–41)
Albumin: 3.3 g/dL — ABNORMAL LOW (ref 3.5–5.0)
Alkaline Phosphatase: 123 U/L (ref 38–126)
Anion gap: 9 (ref 5–15)
BUN: 6 mg/dL (ref 6–20)
CO2: 29 mmol/L (ref 22–32)
Calcium: 7.8 mg/dL — ABNORMAL LOW (ref 8.9–10.3)
Chloride: 90 mmol/L — ABNORMAL LOW (ref 98–111)
Creatinine, Ser: 0.74 mg/dL (ref 0.61–1.24)
GFR, Estimated: >60 mL/min (ref >60)
Glucose, Bld: 116 mg/dL — ABNORMAL HIGH (ref 70–99)
Potassium: 3.7 mmol/L (ref 3.5–5.1)
Sodium: 128 mmol/L — ABNORMAL LOW (ref 135–145)
Total Bilirubin: 2.4 mg/dL — ABNORMAL HIGH (ref 0.0–1.2)
Total Protein: 6.6 g/dL (ref 6.5–8.1)

## 2024-01-27 LAB — CBC
HCT: 40.2 % (ref 39.0–52.0)
Hemoglobin: 14.1 g/dL (ref 13.0–17.0)
MCH: 36.2 pg — ABNORMAL HIGH (ref 26.0–34.0)
MCHC: 35.1 g/dL (ref 30.0–36.0)
MCV: 103.3 fL — ABNORMAL HIGH (ref 80.0–100.0)
Platelets: 80 K/uL — ABNORMAL LOW (ref 150–400)
RBC: 3.89 MIL/uL — ABNORMAL LOW (ref 4.22–5.81)
RDW: 13.3 % (ref 11.5–15.5)
WBC: 5 K/uL (ref 4.0–10.5)
nRBC: 0 % (ref 0.0–0.2)

## 2024-01-27 NOTE — Progress Notes (Signed)
 PROGRESS NOTE    Austin Brown  FMW:[id number] DOB: [birth year] DOA: 01/25/2024 PCP: Clarice Nottingham, MD   Brief Narrative:53 year old man medical history of asthma, essential hypertension, reactive airway disease, marijuana smoking, cigarette smoking, chronic alcohol use and chronic thrombocytopenia presented emergency department complaining of nausea, vomiting, diarrhea and abdominal discomfort. Last drink of alcohol today in the afternoon. Reports symptoms ongoing for last 4 days with epigastric discomfort. Believes his potassium is likely low and took 2 potassium tablets at home. Believes that his symptoms are likely due to alcohol withdrawal although endorses that her last drink was around 3 or 4 PM this afternoon.Typically drinks 6-7 malt beers that are 10% alcohol by weight. Denies any recent substance use beyond a little bit of marijuana. He believes his abdominal pain is likely from the vomiting that he has been experiencing. Denies any recent fever, chills or bodyaches. Patient is also complaining about mid central chest pain.  Reported he feels chest pain with cough.  Has been smoking cigarette and marijuana.     At presentation to ED patient is hemodynamically stable. Elevated lactic acid 3.8.  Low mag level 1.6.  Blood alcohol level 311.  Normal lipase level 49.  CMP showing low sodium 125, low potassium 2.8, low chloride 81, elevated AST/ALT ALP 151/49/144/1.3. CBC unremarkable except low platelet count 136 at baseline. Troponin x 2 within normal range. EKG showed normal sinus rhythm heart rate 98, low voltage precordial leads. Chest x-ray no active disease process. In the ED CIWA protocol and Ativan  prn has been initiated.  Also patient being started on any 75 cc/h.   Assessment & Plan:   Principal Problem:   Alcohol withdrawal (HCC) Active Problems:   Chronic alcohol use   Lactic acidosis   Hyponatremia   Hypokalemia   Nausea and vomiting   Diarrhea   Chronic idiopathic  thrombocytopenia (HCC)   Hepatic steatosis   Generalized anxiety disorder   Transaminitis   Marijuana smoker, episodic   Continuous dependence on cigarette smoking   Reactive airway disease   Alcohol withdrawal delirium, acute, hyperactive (HCC)   Alcohol withdrawal - Presented emergency department complaining of nausea, vomiting and diarrhea for last 4 days with epigastric discomfort.  Patient reported that he has been drinking 6-8 malt beers which contain 10% alcohol.  Initially has been tried to tolerate the alcohol withdrawal at home however due to extensive jitteriness and tremor with associated nausea, vomiting and diarrhea he started drinking again.  Last drink yesterday around 3-4 PM 7/7.  Patient denies any fever, chill and myalgia.  Denies any upper respiratory tract or UTI symptoms - At presentation to ED patient was tachycardic and borderline hypotensive.  Blood pressure has been improved with fluid resuscitation. - Blood alcohol level was 311.  Patient also reported marijuana use.  Checking UDS. - CMP showed low sodium 135, low potassium 2.8, low chloride 81, low BUN 5, elevated AST/ALT/ALP 151/49/144, elevated bilirubin 1.3 and elevated anion gap 17.  Normal lipase level 49.  CBC unremarkable except chronically low platelet 136.  Low mag level 1.6. -In the ED potassium and magnesium  has been repleted.  Patient also being started on NS 75 cc/h. - Continue CIWA and Ativan  as needed. - Continue folic acid , thiamine  and multivitamin. -Continue cardiac monitoring. -Counseled patient at bedside for alcohol cessation -phenobarb protocol      Hyponatremia na 128 -Initial sodium was 125 which has been improved to 130 with maintenance fluid NS. Hyponatremia secondary to beer proteinemia as well  as in the setting of nausea, vomiting and diarrhea.  As well as combination of Zoloft. - Holding Zoloft.  -Continue maintenance fluid NS 75 cc/h. -Goal to improve serum sodium level 8 to 10 mEq  over the course of next 24 hours.   Hypokalemia resolved Hypokalemia in the setting of alcohol and loose stool. -Low potassium 2.8 has been improved to 3.7 with IV and oral KCl supplementation.   Hypomagnesemia 1.8   Nausea, vomiting and diarrhea -Patient reported nausea, vomiting and diarrhea with central chest pain for 3 to 4 days.  Reported known biliary nonbloody vomiting.  Has 1-2 episodes of loose stool on daily basis. Concern for nausea, vomiting and diarrhea in setting of marijuana use, alcohol withdrawal.  Patient is afebrile.  No evidence of leukocytosis.  Lactic acidosis secondary to poor hepatic clearance and type B lactic acidosis rather than elevation from any infectious source. -It is also possible that patient might have some viral gastroenteritis. - Checking GI panel negative   Lactic acidosis-type B -Initial lactic acid 4.1 which has been improved to 3.8 after receiving 1 L of LR bolus in the ED.  Type B lactic acidosis in the setting of poor hepatic clearance from underlying hepatic steatosis and chronic alcohol use related transaminitis.  Also metformin use causing lactic acidosis as well. - Continue to trend lactic acid level.  If patient develops any fever, leukocytosis or signs of infection in that case we will go sepsis pathway.     Generalized anxiety disorder -Holding Zoloft in the setting of hyponatremia   Transaminitis Hepatic steatosis - CMP showed elevated AST 151, ALT 49 and ALP 144.  Per chart review patient has chronic AST elevation and hyperbilirubinemia.  Bilirubin 1.3 today. - Transaminitis in the setting of hepatic steatosis and chronic alcohol use. - Checking hepatitis panel.  Continue to monitor hepatic function.   Essential hypertension -Continue Coreg  6.25 mg twice daily   Noncardiac chest pain - Patient is complaining about mid central chest pain.  Patient reported coughing causing the chest pain.  History of chronic smoking cigarette and  reactive airway disease.  No established diagnosis of COPD. -Troponin x 2 within normal range.  EKG unremarkable.  At this time there is no concern for acute coronary syndrome.   -Noncardiac chest pain in the setting of pleural irritation from cough.     History of prediabetes - Holding metformin in setting of lactic acidosis.  Per chart review most recent A1c 4.9 in 09/2023.     History of prolonged QTc - EKG showing normal QTc interval at this time.   Chronic smoking cigarette History of reactive airway disease - Patient reported he used albuterol  inhaler as needed at home.  Continues to smoke cigarette on daily basis - Continue nicotine  patch. - Starting DuoNeb nebulizer as needed.   Estimated body mass index is 30.18 kg/m as calculated from the following:   Height as of this encounter: 5' 10 (1.778 m).   Weight as of this encounter: 95.4 kg.  DVT prophylaxis: lovenox  Code Status: full Family Communication:none Disposition Plan:  Status is: Inpatient Remains inpatient appropriate because: acute illness   Antimicrobials: none  Subjective: Awake Tremors noted  Objective: Vitals:   01/27/24 0000 01/27/24 0608 01/27/24 0949 01/27/24 1254  BP: (!) 146/93 (!) 135/90 (!) 149/101 (!) 142/96  Pulse: 99 100 (!) 105 86  Resp:  18  15  Temp:  98.7 F (37.1 C)  98 F (36.7 C)  TempSrc:  Oral  SpO2:  96%  97%  Weight:      Height:        Intake/Output Summary (Last 24 hours) at 01/27/2024 1738 Last data filed at 01/27/2024 0900 Gross per 24 hour  Intake 1640.67 ml  Output --  Net 1640.67 ml   Filed Weights   01/25/24 2026  Weight: 95.4 kg    Examination:  General exam: Appears tremolous Respiratory system: Clear to auscultation. Respiratory effort normal. Cardiovascular system:tachy Gastrointestinal system: Abdomen is nondistended, soft and nontender. No organomegaly or masses felt. Normal bowel sounds heard. Central nervous system: Alert and oriented. No focal  neurological deficits. Extremities: Symmetric 5 x 5 power.     Data Reviewed: I have personally reviewed following labs and imaging studies  CBC: Recent Labs  Lab 01/25/24 2104 01/25/24 2112 01/27/24 0718  WBC 8.0  --  5.0  NEUTROABS 4.9  --   --   HGB 16.2 17.0 14.1  HCT 44.7 50.0 40.2  MCV 99.3  --  103.3*  PLT 136*  --  80*   Basic Metabolic Panel: Recent Labs  Lab 01/25/24 2104 01/25/24 2112 01/25/24 2250 01/26/24 0435 01/26/24 0609 01/26/24 1246 01/27/24 0601  NA 125* 129*  --  130*  --  130* 128*  K 2.8* 3.0*  --  3.5  --  3.3* 3.7  CL 81* 83*  --  88*  --  87* 90*  CO2 27  --   --  28  --  29 29  GLUCOSE 111* 113*  --  107*  --  121* 116*  BUN <5* <3*  --  <5*  --  <5* 6  CREATININE 0.64 1.10  --  0.71  --  0.56* 0.74  CALCIUM 8.4*  --   --  8.4*  --  8.4* 7.8*  MG  --   --  1.6*  --  1.8  --   --    GFR: Estimated Creatinine Clearance: 125.3 mL/min (by C-G formula based on SCr of 0.74 mg/dL). Liver Function Tests: Recent Labs  Lab 01/25/24 2104 01/27/24 0601  AST 151* 96*  ALT 49* 35  ALKPHOS 144* 123  BILITOT 1.3* 2.4*  PROT 7.4 6.6  ALBUMIN 3.7 3.3*   Recent Labs  Lab 01/25/24 2104  LIPASE 49   No results for input(s): AMMONIA in the last 168 hours. Coagulation Profile: No results for input(s): INR, PROTIME in the last 168 hours. Cardiac Enzymes: No results for input(s): CKTOTAL, CKMB, CKMBINDEX, TROPONINI in the last 168 hours. BNP (last 3 results) No results for input(s): PROBNP in the last 8760 hours. HbA1C: No results for input(s): HGBA1C in the last 72 hours. CBG: No results for input(s): GLUCAP in the last 168 hours. Lipid Profile: No results for input(s): CHOL, HDL, LDLCALC, TRIG, CHOLHDL, LDLDIRECT in the last 72 hours. Thyroid  Function Tests: No results for input(s): TSH, T4TOTAL, FREET4, T3FREE, THYROIDAB in the last 72 hours. Anemia Panel: No results for input(s): VITAMINB12,  FOLATE, FERRITIN, TIBC, IRON, RETICCTPCT in the last 72 hours. Sepsis Labs: Recent Labs  Lab 01/25/24 2250 01/26/24 0128 01/26/24 0650 01/26/24 1246  LATICACIDVEN 4.2* 3.8* 3.2* 2.9*    Recent Results (from the past 240 hours)  Gastrointestinal Panel by PCR , Stool     Status: None   Collection Time: 01/26/24 11:54 PM   Specimen: Stool  Result Value Ref Range Status   Campylobacter species NOT DETECTED NOT DETECTED Final   Plesimonas shigelloides NOT DETECTED NOT DETECTED Final  Salmonella species NOT DETECTED NOT DETECTED Final   Yersinia enterocolitica NOT DETECTED NOT DETECTED Final   Vibrio species NOT DETECTED NOT DETECTED Final   Vibrio cholerae NOT DETECTED NOT DETECTED Final   Enteroaggregative E coli (EAEC) NOT DETECTED NOT DETECTED Final   Enteropathogenic E coli (EPEC) NOT DETECTED NOT DETECTED Final   Enterotoxigenic E coli (ETEC) NOT DETECTED NOT DETECTED Final   Shiga like toxin producing E coli (STEC) NOT DETECTED NOT DETECTED Final   Shigella/Enteroinvasive E coli (EIEC) NOT DETECTED NOT DETECTED Final   Cryptosporidium NOT DETECTED NOT DETECTED Final   Cyclospora cayetanensis NOT DETECTED NOT DETECTED Final   Entamoeba histolytica NOT DETECTED NOT DETECTED Final   Giardia lamblia NOT DETECTED NOT DETECTED Final   Adenovirus F40/41 NOT DETECTED NOT DETECTED Final   Astrovirus NOT DETECTED NOT DETECTED Final   Norovirus GI/GII NOT DETECTED NOT DETECTED Final   Rotavirus A NOT DETECTED NOT DETECTED Final   Sapovirus (I, II, IV, and V) NOT DETECTED NOT DETECTED Final    Comment: Performed at [facility], [address] [place], [place] [address]         Radiology Studies: DG Chest 2 View Result Date: 01/25/2024 CLINICAL DATA:  Three day history of chest pain and shortness of breath chest EXAM: CHEST - 2 VIEW COMPARISON:  Radiograph dated 01/29/2014 FINDINGS: Normal lung volumes. Patchy medial right basilar opacity. No pleural effusion or  pneumothorax. The heart size and mediastinal contours are within normal limits. No acute osseous abnormality. IMPRESSION: Patchy medial right basilar opacity, which may represent atelectasis or pneumonia. Electronically Signed   By: Limin  Xu M.D.   On: 01/25/2024 21:17    Scheduled Meds:  carvedilol   6.25 mg Oral BID   folic acid   1 mg Oral Daily   guaiFENesin   600 mg Oral BID   multivitamin with minerals  1 tablet Oral Daily   nicotine   21 mg Transdermal Daily   pantoprazole   40 mg Oral Daily   phenobarbital   97.2 mg Oral Q8H   Followed by   [START ON 01/28/2024] phenobarbital   64.8 mg Oral Q8H   Followed by   NOREEN ON 01/30/2024] phenobarbital   32.4 mg Oral Q8H   sodium chloride  flush  3 mL Intravenous Q12H   sodium chloride  flush  3 mL Intravenous Q12H   thiamine   100 mg Oral Daily   Or   thiamine   100 mg Intravenous Daily   Continuous Infusions:   LOS: 1 day    Time spent: 39 min    Almarie KANDICE Hoots, MD  01/27/2024, 5:38 PM

## 2024-01-27 NOTE — Plan of Care (Signed)

## 2024-01-28 ENCOUNTER — Inpatient Hospital Stay (HOSPITAL_COMMUNITY)

## 2024-01-28 DIAGNOSIS — E876 Hypokalemia: Secondary | ICD-10-CM | POA: Diagnosis not present

## 2024-01-28 LAB — COMPREHENSIVE METABOLIC PANEL WITH GFR
ALT: 32 U/L (ref 0–44)
AST: 76 U/L — ABNORMAL HIGH (ref 15–41)
Albumin: 3.1 g/dL — ABNORMAL LOW (ref 3.5–5.0)
Alkaline Phosphatase: 116 U/L (ref 38–126)
Anion gap: 9 (ref 5–15)
BUN: 7 mg/dL (ref 6–20)
CO2: 27 mmol/L (ref 22–32)
Calcium: 7.9 mg/dL — ABNORMAL LOW (ref 8.9–10.3)
Chloride: 89 mmol/L — ABNORMAL LOW (ref 98–111)
Creatinine, Ser: 0.75 mg/dL (ref 0.61–1.24)
GFR, Estimated: >60 mL/min (ref >60)
Glucose, Bld: 96 mg/dL (ref 70–99)
Potassium: 3.3 mmol/L — ABNORMAL LOW (ref 3.5–5.1)
Sodium: 125 mmol/L — ABNORMAL LOW (ref 135–145)
Total Bilirubin: 2.4 mg/dL — ABNORMAL HIGH (ref 0.0–1.2)
Total Protein: 6.5 g/dL (ref 6.5–8.1)

## 2024-01-28 LAB — CBC
HCT: 38.1 % — ABNORMAL LOW (ref 39.0–52.0)
Hemoglobin: 13.1 g/dL (ref 13.0–17.0)
MCH: 35.7 pg — ABNORMAL HIGH (ref 26.0–34.0)
MCHC: 34.4 g/dL (ref 30.0–36.0)
MCV: 103.8 fL — ABNORMAL HIGH (ref 80.0–100.0)
Platelets: 82 K/uL — ABNORMAL LOW (ref 150–400)
RBC: 3.67 MIL/uL — ABNORMAL LOW (ref 4.22–5.81)
RDW: 13.1 % (ref 11.5–15.5)
WBC: 5.4 K/uL (ref 4.0–10.5)
nRBC: 0 % (ref 0.0–0.2)

## 2024-01-28 LAB — OSMOLALITY: Osmolality: 274 mosm/kg — ABNORMAL LOW (ref 275–295)

## 2024-01-28 LAB — MAGNESIUM: Magnesium: 1.8 mg/dL (ref 1.7–2.4)

## 2024-01-28 MED ORDER — POTASSIUM CHLORIDE IN NACL 40-0.9 MEQ/L-% IV SOLN
INTRAVENOUS | Status: DC
  Filled 2024-01-28 (×2): qty 1000

## 2024-01-28 MED ORDER — LORAZEPAM 2 MG/ML IJ SOLN
1.0000 mg | INTRAMUSCULAR | Status: AC | PRN
  Administered 2024-01-28 – 2024-01-29 (×4): 2 mg via INTRAVENOUS
  Administered 2024-01-29: 4 mg via INTRAVENOUS
  Administered 2024-01-29 (×3): 2 mg via INTRAVENOUS
  Administered 2024-01-29: 4 mg via INTRAVENOUS
  Administered 2024-01-29 (×2): 3 mg via INTRAVENOUS
  Administered 2024-01-29: 2 mg via INTRAVENOUS
  Administered 2024-01-30 (×3): 4 mg via INTRAVENOUS
  Administered 2024-01-30: 2 mg via INTRAVENOUS
  Administered 2024-01-31: 4 mg via INTRAVENOUS
  Filled 2024-01-28 (×3): qty 1
  Filled 2024-01-28: qty 2
  Filled 2024-01-28: qty 1
  Filled 2024-01-28 (×2): qty 2
  Filled 2024-01-28 (×2): qty 1
  Filled 2024-01-28 (×2): qty 2
  Filled 2024-01-28: qty 1
  Filled 2024-01-28: qty 2
  Filled 2024-01-28: qty 1
  Filled 2024-01-28 (×2): qty 2
  Filled 2024-01-28: qty 1

## 2024-01-28 MED ORDER — SODIUM CHLORIDE 0.9 % IV SOLN: INTRAVENOUS | Status: DC

## 2024-01-28 MED ORDER — LORAZEPAM 1 MG PO TABS
1.0000 mg | ORAL_TABLET | ORAL | Status: AC | PRN
  Administered 2024-01-31: 1 mg via ORAL
  Administered 2024-01-31: 2 mg via ORAL
  Filled 2024-01-28: qty 2
  Filled 2024-01-28: qty 1

## 2024-01-28 NOTE — Progress Notes (Signed)
 PROGRESS NOTE    Austin Brown  FMW:[id number] DOB: [birth year] DOA: 01/25/2024 PCP: Clarice Nottingham, MD   Brief Narrative:53 year old man medical history of asthma, essential hypertension, reactive airway disease, marijuana smoking, cigarette smoking, chronic alcohol use and chronic thrombocytopenia presented emergency department complaining of nausea, vomiting, diarrhea and abdominal discomfort. Last drink of alcohol today in the afternoon. Reports symptoms ongoing for last 4 days with epigastric discomfort. Believes his potassium is likely low and took 2 potassium tablets at home. Believes that his symptoms are likely due to alcohol withdrawal although endorses that her last drink was around 3 or 4 PM this afternoon.Typically drinks 6-7 malt beers that are 10% alcohol by weight. Denies any recent substance use beyond a little bit of marijuana. He believes his abdominal pain is likely from the vomiting that he has been experiencing. Denies any recent fever, chills or bodyaches. Patient is also complaining about mid central chest pain.  Reported he feels chest pain with cough.  Has been smoking cigarette and marijuana.     At presentation to ED patient is hemodynamically stable. Elevated lactic acid 3.8.  Low mag level 1.6.  Blood alcohol level 311.  Normal lipase level 49.  CMP showing low sodium 125, low potassium 2.8, low chloride 81, elevated AST/ALT ALP 151/49/144/1.3. CBC unremarkable except low platelet count 136 at baseline. Troponin x 2 within normal range. EKG showed normal sinus rhythm heart rate 98, low voltage precordial leads. Chest x-ray no active disease process. In the ED CIWA protocol and Ativan  prn has been initiated.  Also patient being started on any 75 cc/h.   Assessment & Plan:   Principal Problem:   Alcohol withdrawal (HCC) Active Problems:   Chronic alcohol use   Lactic acidosis   Hyponatremia   Hypokalemia   Nausea and vomiting   Diarrhea   Chronic idiopathic  thrombocytopenia (HCC)   Hepatic steatosis   Generalized anxiety disorder   Transaminitis   Marijuana smoker, episodic   Continuous dependence on cigarette smoking   Reactive airway disease   Alcohol withdrawal delirium, acute, hyperactive (HCC)   Alcohol withdrawal - Presented emergency department complaining of nausea, vomiting and diarrhea for last 4 days with epigastric discomfort.  Patient reported that he has been drinking 6-8 malt beers which contain 10% alcohol.  Initially has been tried to tolerate the alcohol withdrawal at home however due to extensive jitteriness and tremor with associated nausea, vomiting and diarrhea he started drinking again.  Last drink yesterday around 3-4 PM 7/7.  Patient denies any fever, chill and myalgia.  Denies any upper respiratory tract or UTI symptoms - At presentation to ED patient was tachycardic and borderline hypotensive.  Blood pressure has been improved with fluid resuscitation. - Blood alcohol level was 311.  Patient also reported marijuana use. - CMP showed low sodium 135, low potassium 2.8, low chloride 81, low BUN 5, elevated AST/ALT/ALP 151/49/144, elevated bilirubin 1.3 and elevated anion gap 17.  Normal lipase level 49.  CBC unremarkable except chronically low platelet 136.  Low mag level 1.6. -In the ED potassium and magnesium  has been repleted.  Patient also being started on NS 75 cc/h. - Continue CIWA and Ativan  as needed. - Continue folic acid , thiamine  and multivitamin. -Continue cardiac monitoring. -Counseled patient at bedside for alcohol cessation -phenobarb protocol      Hyponatremia na 125 from 128 from 130 check urine sodium urine osmolality and serum osmolality.  Initially sodium improved with NS.  Continue NS for now and  recheck in AM. -Initial sodium was 125 which has been improved to 130 with maintenance fluid NS. Hyponatremia secondary to beer proteinemia as well as in the setting of nausea, vomiting and diarrhea.  As  well as combination of Zoloft. - Holding Zoloft.  -Continue maintenance fluid NS 75 cc/h.   Hypokalemia potassium 3.3 replete hypokalemia in the setting of alcohol and loose stool.  Hypomagnesemia 1.8   Nausea, vomiting and diarrhea -Patient reported nausea, vomiting and diarrhea with central chest pain for 3 to 4 days.  Reported known biliary nonbloody vomiting.  Has 1-2 episodes of loose stool on daily basis. Concern for nausea, vomiting and diarrhea in setting of marijuana use, alcohol withdrawal.  Patient is afebrile.  No evidence of leukocytosis.  Lactic acidosis secondary to poor hepatic clearance and type B lactic acidosis rather than elevation from any infectious source. -It is also possible that patient might have some viral gastroenteritis.  GI panel negative   Lactic acidosis-type B -Initial lactic acid 4.1 which has been improved to 3.8 after receiving 1 L of LR bolus in the ED.  Type B lactic acidosis in the setting of poor hepatic clearance from underlying hepatic steatosis and chronic alcohol use related transaminitis.  Also metformin use causing lactic acidosis as well. - Continue to trend lactic acid level.  If patient develops any fever, leukocytosis or signs of infection in that case we will go sepsis pathway.     Generalized anxiety disorder -Holding Zoloft in the setting of hyponatremia   Transaminitis Hepatic steatosis improved - CMP showed elevated AST 151, ALT 49 and ALP 144.  Per chart review patient has chronic AST elevation and hyperbilirubinemia.  Bilirubin 1.3 today. - Transaminitis in the setting of hepatic steatosis and chronic alcohol use. - Negative hepatitis panel.  Continue to monitor hepatic function.   Essential hypertension -Continue Coreg  6.25 mg twice daily   Noncardiac chest pain - Patient is complaining about mid central chest pain.  Patient reported coughing causing the chest pain.  History of chronic smoking cigarette and reactive airway  disease.  No established diagnosis of COPD. -Troponin x 2 within normal range.  EKG unremarkable.  At this time there is no concern for acute coronary syndrome.   -Noncardiac chest pain in the setting of pleural irritation from cough.     History of prediabetes - Holding metformin in setting of lactic acidosis.  Per chart review most recent A1c 4.9 in 09/2023.     History of prolonged QTc - EKG showing normal QTc interval at this time.   Chronic smoking cigarette History of reactive airway disease - Patient reported he used albuterol  inhaler as needed at home.  Continues to smoke cigarette on daily basis - Continue nicotine  patch. - Starting DuoNeb nebulizer as needed.   Estimated body mass index is 30.18 kg/m as calculated from the following:   Height as of this encounter: 5' 10 (1.778 m).   Weight as of this encounter: 95.4 kg.  DVT prophylaxis: lovenox  Code Status: full Family Communication:none Disposition Plan:  Status is: Inpatient Remains inpatient appropriate because: acute illness   Antimicrobials: none  Subjective: Drowsy Appropriate when asked questions  Follow commands Passed bed side swallow Objective: Vitals:   01/28/24 0404 01/28/24 0406 01/28/24 0748 01/28/24 0800  BP: (!) 142/102 (!) 142/102 (!) 147/112 (!) 138/90  Pulse: 88 88 (!) 101 90  Resp: 18     Temp: 98.6 F (37 C)     TempSrc: Oral  SpO2: 93%     Weight:      Height:        Intake/Output Summary (Last 24 hours) at 01/28/2024 1157 Last data filed at 01/28/2024 1000 Gross per 24 hour  Intake 243 ml  Output --  Net 243 ml   Filed Weights   01/25/24 2026  Weight: 95.4 kg    Examination:  General exam: Appears tremolous Respiratory system: Clear to auscultation. Respiratory effort normal. Cardiovascular system:tachy Gastrointestinal system: Abdomen is nondistended, soft and nontender. No organomegaly or masses felt. Normal bowel sounds heard. Central nervous system: Alert and  oriented. No focal neurological deficits. Extremities: RUE edema   Data Reviewed: I have personally reviewed following labs and imaging studies  CBC: Recent Labs  Lab 01/25/24 2104 01/25/24 2112 01/27/24 0718 01/28/24 0559  WBC 8.0  --  5.0 5.4  NEUTROABS 4.9  --   --   --   HGB 16.2 17.0 14.1 13.1  HCT 44.7 50.0 40.2 38.1*  MCV 99.3  --  103.3* 103.8*  PLT 136*  --  80* 82*   Basic Metabolic Panel: Recent Labs  Lab 01/25/24 2104 01/25/24 2112 01/25/24 2250 01/26/24 0435 01/26/24 0609 01/26/24 1246 01/27/24 0601 01/28/24 0559  NA 125* 129*  --  130*  --  130* 128* 125*  K 2.8* 3.0*  --  3.5  --  3.3* 3.7 3.3*  CL 81* 83*  --  88*  --  87* 90* 89*  CO2 27  --   --  28  --  29 29 27   GLUCOSE 111* 113*  --  107*  --  121* 116* 96  BUN <5* <3*  --  <5*  --  <5* 6 7  CREATININE 0.64 1.10  --  0.71  --  0.56* 0.74 0.75  CALCIUM 8.4*  --   --  8.4*  --  8.4* 7.8* 7.9*  MG  --   --  1.6*  --  1.8  --   --   --    GFR: Estimated Creatinine Clearance: 125.3 mL/min (by C-G formula based on SCr of 0.75 mg/dL). Liver Function Tests: Recent Labs  Lab 01/25/24 2104 01/27/24 0601 01/28/24 0559  AST 151* 96* 76*  ALT 49* 35 32  ALKPHOS 144* 123 116  BILITOT 1.3* 2.4* 2.4*  PROT 7.4 6.6 6.5  ALBUMIN 3.7 3.3* 3.1*   Recent Labs  Lab 01/25/24 2104  LIPASE 49   No results for input(s): AMMONIA in the last 168 hours. Coagulation Profile: No results for input(s): INR, PROTIME in the last 168 hours. Cardiac Enzymes: No results for input(s): CKTOTAL, CKMB, CKMBINDEX, TROPONINI in the last 168 hours. BNP (last 3 results) No results for input(s): PROBNP in the last 8760 hours. HbA1C: No results for input(s): HGBA1C in the last 72 hours. CBG: No results for input(s): GLUCAP in the last 168 hours. Lipid Profile: No results for input(s): CHOL, HDL, LDLCALC, TRIG, CHOLHDL, LDLDIRECT in the last 72 hours. Thyroid  Function Tests: No results  for input(s): TSH, T4TOTAL, FREET4, T3FREE, THYROIDAB in the last 72 hours. Anemia Panel: No results for input(s): VITAMINB12, FOLATE, FERRITIN, TIBC, IRON, RETICCTPCT in the last 72 hours. Sepsis Labs: Recent Labs  Lab 01/25/24 2250 01/26/24 0128 01/26/24 0650 01/26/24 1246  LATICACIDVEN 4.2* 3.8* 3.2* 2.9*    Recent Results (from the past 240 hours)  Gastrointestinal Panel by PCR , Stool     Status: None   Collection Time: 01/26/24 11:54 PM  Specimen: Stool  Result Value Ref Range Status   Campylobacter species NOT DETECTED NOT DETECTED Final   Plesimonas shigelloides NOT DETECTED NOT DETECTED Final   Salmonella species NOT DETECTED NOT DETECTED Final   Yersinia enterocolitica NOT DETECTED NOT DETECTED Final   Vibrio species NOT DETECTED NOT DETECTED Final   Vibrio cholerae NOT DETECTED NOT DETECTED Final   Enteroaggregative E coli (EAEC) NOT DETECTED NOT DETECTED Final   Enteropathogenic E coli (EPEC) NOT DETECTED NOT DETECTED Final   Enterotoxigenic E coli (ETEC) NOT DETECTED NOT DETECTED Final   Shiga like toxin producing E coli (STEC) NOT DETECTED NOT DETECTED Final   Shigella/Enteroinvasive E coli (EIEC) NOT DETECTED NOT DETECTED Final   Cryptosporidium NOT DETECTED NOT DETECTED Final   Cyclospora cayetanensis NOT DETECTED NOT DETECTED Final   Entamoeba histolytica NOT DETECTED NOT DETECTED Final   Giardia lamblia NOT DETECTED NOT DETECTED Final   Adenovirus F40/41 NOT DETECTED NOT DETECTED Final   Astrovirus NOT DETECTED NOT DETECTED Final   Norovirus GI/GII NOT DETECTED NOT DETECTED Final   Rotavirus A NOT DETECTED NOT DETECTED Final   Sapovirus (I, II, IV, and V) NOT DETECTED NOT DETECTED Final    Comment: Performed at [facility], [address], [place], [place] [address]         Radiology Studies: No results found.   Scheduled Meds:  carvedilol   6.25 mg Oral BID   folic acid   1 mg Oral Daily   guaiFENesin   600 mg Oral  BID   multivitamin with minerals  1 tablet Oral Daily   nicotine   21 mg Transdermal Daily   pantoprazole   40 mg Oral Daily   phenobarbital   64.8 mg Oral Q8H   Followed by   NOREEN ON 01/30/2024] phenobarbital   32.4 mg Oral Q8H   sodium chloride  flush  3 mL Intravenous Q12H   sodium chloride  flush  3 mL Intravenous Q12H   thiamine   100 mg Oral Daily   Or   thiamine   100 mg Intravenous Daily   Continuous Infusions:   LOS: 2 days    Time spent: 39 min    Almarie KANDICE Hoots, MD  01/28/2024, 11:57 AM

## 2024-01-28 NOTE — Progress Notes (Signed)
 PT Cancellation Note  Patient Details Name: Austin Brown MRN: [id number] DOB: 1971/02/06   Cancelled Treatment:    Reason Eval/Treat Not Completed: Medical issues which prohibited therapy. Per RN pt not appropriate for eval, will return for PT when appropriate.   Tori Treyshawn Muldrew PT, DPT 01/28/24, 1:22 PM

## 2024-01-28 NOTE — TOC Initial Note (Signed)
 Transition of Care [facility]) - Initial/Assessment Note    Patient Details  Name: Austin Brown MRN: [id number] Date of Birth: [birth year]  Transition of Care [facility]) CM/SW Contact:    Toy LITTIE Agar, RN Phone Number:[phone]  01/28/2024, 3:12 PM  Clinical Narrative:                 TOC unable to complete initial assessment . Patient is currently confused unable to respond appropriately to assessment questions. TOC will follow.         Patient Goals and CMS Choice            Expected Discharge Plan and Services                                              Prior Living Arrangements/Services                       Activities of Daily Living   ADL Screening (condition at time of admission) Independently performs ADLs?: Yes (appropriate for developmental age) Is the patient deaf or have difficulty hearing?: No Does the patient have difficulty seeing, even when wearing glasses/contacts?: No Does the patient have difficulty concentrating, remembering, or making decisions?: No  Permission Sought/Granted                  Emotional Assessment              Admission diagnosis:  Alcohol withdrawal (HCC) [F10.939] Hypokalemia [E87.6] Hypomagnesemia [E83.42] Alcohol abuse [F10.10] Hyponatremia [E87.1] Alcohol withdrawal delirium, acute, hyperactive (HCC) [F10.931] Patient Active Problem List   Diagnosis Date Noted   Chronic alcohol use 01/26/2024   Nausea and vomiting 01/26/2024   Diarrhea 01/26/2024   Chronic idiopathic thrombocytopenia (HCC) 01/26/2024   Lactic acidosis 01/26/2024   Hepatic steatosis 01/26/2024   Generalized anxiety disorder 01/26/2024   Transaminitis 01/26/2024   Marijuana smoker, episodic 01/26/2024   Continuous dependence on cigarette smoking 01/26/2024   Reactive airway disease 01/26/2024   Alcohol withdrawal delirium, acute, hyperactive (HCC) 01/26/2024   Alcohol withdrawal (HCC) 07/31/2023   Low blood pressure  07/31/2023   Electrolyte abnormality 07/31/2023   Nicotine  dependence 11/07/2021   Hyponatremia 11/06/2021   Alcohol abuse 11/06/2021   Hypokalemia 11/06/2021   Hypomagnesemia 11/06/2021   Essential hypertension 11/06/2021   Mild intermittent asthma without complication 11/06/2021   Prolonged QT interval 11/06/2021   PCP:  Clarice Nottingham, MD Pharmacy:   [facility] [address], [place] - [address] AT [facility] [address] [place] [address] Phone: [phone] Fax: [phone]     Social Drivers of Health (SDOH) Social History: SDOH Screenings   Food Insecurity: No Food Insecurity (01/26/2024)  Housing: Low Risk  (01/26/2024)  Transportation Needs: No Transportation Needs (01/26/2024)  Utilities: Not At Risk (01/26/2024)  Depression (PHQ2-9): High Risk (09/29/2023)  Social Connections: Moderately Isolated (07/31/2023)  Tobacco Use: High Risk (01/26/2024)   SDOH Interventions:     Readmission Risk Interventions     No data to display

## 2024-01-28 NOTE — Plan of Care (Signed)
 Pt continues to display signs of withdrawal. Pt refusing to keep tele on overnight, importance of telemetry reinforced multiple times throughout shift. Pt also became verbally aggressive with staff overnight but was easily re-directed. Pt continues to be a high fall risk, bed alarm activated and audible, call light within reach.    Problem: Activity: Goal: Risk for activity intolerance will decrease Outcome: Progressing   Problem: Pain Managment: Goal: General experience of comfort will improve and/or be controlled Outcome: Progressing   Problem: Safety: Goal: Ability to remain free from injury will improve Outcome: Progressing

## 2024-01-28 NOTE — Plan of Care (Signed)

## 2024-01-29 DIAGNOSIS — E876 Hypokalemia: Secondary | ICD-10-CM | POA: Diagnosis not present

## 2024-01-29 LAB — CBC
HCT: 36.5 % — ABNORMAL LOW (ref 39.0–52.0)
Hemoglobin: 12.5 g/dL — ABNORMAL LOW (ref 13.0–17.0)
MCH: 36 pg — ABNORMAL HIGH (ref 26.0–34.0)
MCHC: 34.2 g/dL (ref 30.0–36.0)
MCV: 105.2 fL — ABNORMAL HIGH (ref 80.0–100.0)
Platelets: 86 K/uL — ABNORMAL LOW (ref 150–400)
RBC: 3.47 MIL/uL — ABNORMAL LOW (ref 4.22–5.81)
RDW: 13.2 % (ref 11.5–15.5)
WBC: 5.8 K/uL (ref 4.0–10.5)
nRBC: 0 % (ref 0.0–0.2)

## 2024-01-29 LAB — COMPREHENSIVE METABOLIC PANEL WITH GFR
ALT: 35 U/L (ref 0–44)
AST: 89 U/L — ABNORMAL HIGH (ref 15–41)
Albumin: 3.1 g/dL — ABNORMAL LOW (ref 3.5–5.0)
Alkaline Phosphatase: 129 U/L — ABNORMAL HIGH (ref 38–126)
Anion gap: 10 (ref 5–15)
BUN: 10 mg/dL (ref 6–20)
CO2: 22 mmol/L (ref 22–32)
Calcium: 8.1 mg/dL — ABNORMAL LOW (ref 8.9–10.3)
Chloride: 100 mmol/L (ref 98–111)
Creatinine, Ser: 0.72 mg/dL (ref 0.61–1.24)
GFR, Estimated: >60 mL/min (ref >60)
Glucose, Bld: 102 mg/dL — ABNORMAL HIGH (ref 70–99)
Potassium: 3.6 mmol/L (ref 3.5–5.1)
Sodium: 132 mmol/L — ABNORMAL LOW (ref 135–145)
Total Bilirubin: 1.8 mg/dL — ABNORMAL HIGH (ref 0.0–1.2)
Total Protein: 6.1 g/dL — ABNORMAL LOW (ref 6.5–8.1)

## 2024-01-29 LAB — URINALYSIS, ROUTINE W REFLEX MICROSCOPIC
Bilirubin Urine: NEGATIVE
Glucose, UA: NEGATIVE mg/dL
Hgb urine dipstick: NEGATIVE
Ketones, ur: 5 mg/dL — AB
Leukocytes,Ua: NEGATIVE
Nitrite: NEGATIVE
Protein, ur: NEGATIVE mg/dL
Specific Gravity, Urine: 1.013 (ref 1.005–1.030)
pH: 8 (ref 5.0–8.0)

## 2024-01-29 LAB — RAPID URINE DRUG SCREEN, HOSP PERFORMED
Amphetamines: NOT DETECTED
Barbiturates: POSITIVE — AB
Benzodiazepines: POSITIVE — AB
Cocaine: NOT DETECTED
Opiates: NOT DETECTED
Tetrahydrocannabinol: POSITIVE — AB

## 2024-01-29 LAB — SODIUM, URINE, RANDOM: Sodium, Ur: 154 mmol/L

## 2024-01-29 LAB — OSMOLALITY, URINE: Osmolality, Ur: 535 mosm/kg (ref 300–900)

## 2024-01-29 MED ORDER — SODIUM CHLORIDE 0.9 % IV SOLN: INTRAVENOUS | Status: DC

## 2024-01-29 MED ORDER — CHLORDIAZEPOXIDE HCL 25 MG PO CAPS
50.0000 mg | ORAL_CAPSULE | Freq: Three times a day (TID) | ORAL | Status: AC
  Administered 2024-01-29 – 2024-01-30 (×5): 50 mg via ORAL
  Filled 2024-01-29 (×5): qty 2

## 2024-01-29 MED ORDER — DEXMEDETOMIDINE HCL IN NACL 200 MCG/50ML IV SOLN
0.0000 ug/kg/h | INTRAVENOUS | Status: DC
  Administered 2024-01-30 (×3): 0.4 ug/kg/h via INTRAVENOUS
  Administered 2024-01-30: 0.6 ug/kg/h via INTRAVENOUS
  Administered 2024-01-30: 0.4 ug/kg/h via INTRAVENOUS
  Administered 2024-01-30: 0.8 ug/kg/h via INTRAVENOUS
  Administered 2024-01-31 (×2): 0.5 ug/kg/h via INTRAVENOUS
  Filled 2024-01-29 (×8): qty 50

## 2024-01-29 MED ORDER — LORAZEPAM 2 MG/ML IJ SOLN
2.0000 mg | Freq: Once | INTRAMUSCULAR | Status: AC
  Administered 2024-01-29: 2 mg via INTRAVENOUS

## 2024-01-29 NOTE — Progress Notes (Signed)
   01/29/24 2224  What Happened  Was fall witnessed? Yes  Who witnessed fall? Summer, CNA; Tiwatope Emmitt, RN; Cullen, CNA  Patients activity before fall ambulating-unassisted  Point of contact head  Was patient injured? No  Provider Notification  Provider Name/Title Andrez, NP  Date Provider Notified 01/29/24  Time Provider Notified 2230  Method of Notification Page  Notification Reason Fall  Provider response See new orders  Date of Provider Response 01/29/24  Time of Provider Response 2241  Follow Up  Progress note created (see row info) Yes  Adult Fall Risk Assessment  Risk Factor Category (scoring not indicated) Fall has occurred during this admission (document High fall risk)  Patient Fall Risk Level High fall risk  Adult Fall Risk Interventions  Required Bundle Interventions *See Row Information* High fall risk - low, moderate, and high requirements implemented  Additional Interventions Safety Sitter/Safety Rounder;Use of appropriate toileting equipment (bedpan, BSC, etc.)  Fall intervention(s) refused/Patient educated regarding refusal Bed alarm;Supervision while toileting/edge of bed sitting  Screening for Fall Injury Risk (To be completed on HIGH fall risk patients) - Assessing Need for Floor Mats  Risk For Fall Injury- Criteria for Floor Mats Noncompliant with safety precautions  Will Implement Floor Mats Yes  Vitals  BP 126/81  MAP (mmHg) 93  BP Location Right Arm  BP Method Automatic  Patient Position (if appropriate) Lying  Pulse Rate 90  Pulse Rate Source Monitor  Resp 18  Oxygen Therapy  SpO2 100 %  O2 Device Room Air   Patient was agitated, refused assisting while standing and walking. Patient fall on the floor and left head hit the wall. Patient does not complaining pain. 4mg  Ativan  IV given, Charge nurse and on-call notified.

## 2024-01-29 NOTE — Plan of Care (Incomplete)
  Problem: Education: Goal: Knowledge of General Education information will improve Description: Including pain rating scale, medication(s)/side effects and non-pharmacologic comfort measures Outcome: Progressing   Problem: Health Behavior/Discharge Planning: Goal: Ability to manage health-related needs will improve Outcome: Progressing   Problem: Clinical Measurements: Goal: Ability to maintain clinical measurements within normal limits will improve Outcome: Progressing Goal: Diagnostic test results will improve Outcome: Progressing Goal: Respiratory complications will improve Outcome: Progressing   Problem: Activity: Goal: Risk for activity intolerance will decrease Outcome: Progressing   Problem: Nutrition: Goal: Adequate nutrition will be maintained Outcome: Progressing   Problem: Coping: Goal: Level of anxiety will decrease Outcome: Progressing   Problem: Safety: Goal: Ability to remain free from injury will improve Outcome: Progressing   Problem: Safety: Goal: Non-violent Restraint(s) Outcome: Progressing   Problem: Clinical Measurements: Goal: Will remain free from infection Outcome: Adequate for Discharge Goal: Cardiovascular complication will be avoided Outcome: Adequate for Discharge   Problem: Elimination: Goal: Will not experience complications related to bowel motility Outcome: Adequate for Discharge Goal: Will not experience complications related to urinary retention Outcome: Adequate for Discharge   Problem: Pain Managment: Goal: General experience of comfort will improve and/or be controlled Outcome: Adequate for Discharge   Problem: Skin Integrity: Goal: Risk for impaired skin integrity will decrease Outcome: Adequate for Discharge

## 2024-01-29 NOTE — Progress Notes (Signed)
    Patient Name: Austin Brown           DOB: 10/01/1970  MRN: [id number]      Admission Date: 01/25/2024  Attending Provider: Will Almarie MATSU, MD  Primary Diagnosis: Alcohol withdrawal [facility])   Level of care: Telemetry    CROSS COVER NOTE   Date of Service   01/29/2024   Austin Brown, 53 y.o. male, was admitted on 01/25/2024 for Alcohol withdrawal (HCC).    HPI/Events of Note   Witnessed fall Fall occurred while patient was sitting on bedside commode. Fell on [employer]. Bumped left side of his head. Mentation at baseline, denies pain or discomfort. ROM at baseline. Patient was able to get up with assistance.  RN reports there are no visible sign of injury or deformities.  Skin is intact.. No neuro changes.   Interventions/ Plan   No further investigative work-up necessary at this time.  Nursing staff to continue monitoring for change in acute symptoms, mobility, and pain. Continue CIWA and ativan , phenobarbital  for ETOH w/d.  Sitter- available after 7 am. Use restraints in the meantime due to agitation and fall risk.  Restraints to be d/c once sitter is in place       Kinsey Cowsert, [employer], ACNPC- AG [facility] [place]

## 2024-01-29 NOTE — Progress Notes (Signed)
 PT Cancellation Note  Patient Details Name: ARNO CULLERS MRN: [id number] DOB: 11/13/1970   Cancelled Treatment:    Reason Eval/Treat Not Completed: Medical issues which prohibited therapy. Per nursing, pt still on ativan  every 2 hours, elevated BP and HR, being transferred to higher level of care; will hold PT eval until appropriate.    Tori Lemmie Vanlanen PT, DPT 01/29/24, 9:04 AM

## 2024-01-29 NOTE — Progress Notes (Signed)
 PROGRESS NOTE    Austin Brown  FMW:[id number] DOB: 1970-09-25 DOA: 01/25/2024 PCP: Clarice Nottingham, MD   Brief Narrative:53 year old man medical history of asthma, essential hypertension, reactive airway disease, marijuana smoking, cigarette smoking, chronic alcohol use and chronic thrombocytopenia presented emergency department complaining of nausea, vomiting, diarrhea and abdominal discomfort. Last drink of alcohol today in the afternoon. Reports symptoms ongoing for last 4 days with epigastric discomfort. Believes his potassium is likely low and took 2 potassium tablets at home. Believes that his symptoms are likely due to alcohol withdrawal although endorses that her last drink was around 3 or 4 PM this afternoon.Typically drinks 6-7 malt beers that are 10% alcohol by weight. Denies any recent substance use beyond a little bit of marijuana. He believes his abdominal pain is likely from the vomiting that he has been experiencing. Denies any recent fever, chills or bodyaches. Patient is also complaining about mid central chest pain.  Reported he feels chest pain with cough.  Has been smoking cigarette and marijuana.     At presentation to ED patient is hemodynamically stable. Elevated lactic acid 3.8.  Low mag level 1.6.  Blood alcohol level 311.  Normal lipase level 49.  CMP showing low sodium 125, low potassium 2.8, low chloride 81, elevated AST/ALT ALP 151/49/144/1.3. CBC unremarkable except low platelet count 136 at baseline. Troponin x 2 within normal range. EKG showed normal sinus rhythm heart rate 98, low voltage precordial leads. Chest x-ray no active disease process. In the ED CIWA protocol and Ativan  prn has been initiated.  Also patient being started on any 75 cc/h.   Assessment & Plan:   Principal Problem:   Alcohol withdrawal (HCC) Active Problems:   Chronic alcohol use   Lactic acidosis   Hyponatremia   Hypokalemia   Nausea and vomiting   Diarrhea   Chronic idiopathic  thrombocytopenia (HCC)   Hepatic steatosis   Generalized anxiety disorder   Transaminitis   Marijuana smoker, episodic   Continuous dependence on cigarette smoking   Reactive airway disease   Alcohol withdrawal delirium, acute, hyperactive (HCC)   Alcohol withdrawal - Presented emergency department complaining of nausea, vomiting and diarrhea for last 4 days with epigastric discomfort.  Patient reported that he has been drinking 6-8 malt beers which contain 10% alcohol.  Initially has been tried to tolerate the alcohol withdrawal at home however due to extensive jitteriness and tremor with associated nausea, vomiting and diarrhea he started drinking again.  Last drink  around 3-4 PM 7/7.  Patient denies any fever, chill and myalgia.  Denies any upper respiratory tract or UTI symptoms - At presentation to ED patient was tachycardic and borderline hypotensive.  Blood pressure has been improved with fluid resuscitation. - Blood alcohol level was 311.  Patient also reported marijuana use.UDS pending - CMP showed low sodium 135, low potassium 2.8, low chloride 81, low BUN 5, elevated AST/ALT/ALP 151/49/144, elevated bilirubin 1.3 and elevated anion gap 17.  Normal lipase level 49.  CBC unremarkable except chronically low platelet 136.  Low mag level 1.6. -In the ED potassium and magnesium  has been repleted.  Patient also being started on NS 75 cc/h. - Continue CIWA and Ativan  as needed. - Continue folic acid , thiamine  and multivitamin. -Continue cardiac monitoring. -Counseled patient at bedside for alcohol cessation -phenobarb protocol  -he received 30 mg ativan  along with pheno barb -transferred to progressive -start librium  50 mg tid     Hyponatremia na 132 from 125 from 128 from 130  check urine sodium urine osmolality and serum osmolality.  Initially sodium improved with NS.  Continue NS for now and recheck in AM. Hyponatremia secondary to beer proteinemia as well as in the setting of  nausea, vomiting and diarrhea.  As well as combination of Zoloft. - Holding Zoloft.  -Continue maintenance fluid NS 75 cc/h.   Hypokalemia potassium 3.6 replete  hypokalemia in the setting of alcohol and loose stool.  Hypomagnesemia 1.8   Nausea, vomiting and diarrhea resolved -Patient reported nausea, vomiting and diarrhea with central chest pain for 3 to 4 days.  Reported known biliary nonbloody vomiting.  Has 1-2 episodes of loose stool on daily basis. Concern for nausea, vomiting and diarrhea in setting of marijuana use, alcohol withdrawal.  Patient is afebrile.  No evidence of leukocytosis.  Lactic acidosis secondary to poor hepatic clearance and type B lactic acidosis rather than elevation from any infectious source. -It is also possible that patient might have some viral gastroenteritis.  GI panel negative   Lactic acidosis-type B -Initial lactic acid 4.1 which has been improved to 3.8 after receiving 1 L of LR bolus in the ED.  Type B lactic acidosis in the setting of poor hepatic clearance from underlying hepatic steatosis and chronic alcohol use related transaminitis.  Also metformin use causing lactic acidosis as well.   Generalized anxiety disorder -Holding Zoloft in the setting of hyponatremia   Transaminitis Hepatic steatosis improved   Per chart review patient has chronic AST elevation and hyperbilirubinemia.  Bilirubin 1.3 today. - Transaminitis in the setting of hepatic steatosis and chronic alcohol use. - Negative hepatitis panel.  Continue to monitor hepatic function.   Essential hypertension -Continue Coreg  6.25 mg twice daily   Noncardiac chest pain - Patient is complaining about mid central chest pain.  Patient reported coughing causing the chest pain.  History of chronic smoking cigarette and reactive airway disease.  No established diagnosis of COPD. -Troponin x 2 within normal range.  EKG unremarkable.  At this time there is no concern for acute coronary  syndrome.   -Noncardiac chest pain in the setting of pleural irritation from cough/gastritis     History of prediabetes - Holding metformin in setting of lactic acidosis.  Per chart review most recent A1c 4.9 in 09/2023.     History of prolonged QTc - EKG showing normal QTc interval at this time.   Chronic smoking cigarette History of reactive airway disease - Patient reported he used albuterol  inhaler as needed at home.  Continues to smoke cigarette on daily basis - Continue nicotine  patch. - Starting DuoNeb nebulizer as needed.   Estimated body mass index is 30.18 kg/m as calculated from the following:   Height as of this encounter: 5' 10 (1.778 m).   Weight as of this encounter: 95.4 kg.  DVT prophylaxis: lovenox  Code Status: full Family Communication:none Disposition Plan:  Status is: Inpatient Remains inpatient appropriate because: acute illness   Antimicrobials: none  Subjective:  Hand mittens in place  Ativan  30 mg last 24 hrs On pheno barb taper  Objective: Vitals:   01/29/24 0729 01/29/24 0732 01/29/24 1027 01/29/24 1122  BP: (!) 164/100 (!) 164/100 (!) 156/103 (!) 133/90  Pulse: 83 88 81 81  Resp:  14 16 14   Temp:  98.7 F (37.1 C) 98.6 F (37 C) 98.1 F (36.7 C)  TempSrc:    Oral  SpO2:  94% 98% 96%  Weight:      Height:  Intake/Output Summary (Last 24 hours) at 01/29/2024 1434 Last data filed at 01/29/2024 0600 Gross per 24 hour  Intake 2535.83 ml  Output 0 ml  Net 2535.83 ml   Filed Weights   01/25/24 2026  Weight: 95.4 kg    Examination:  General exam: Appears tremolous, Respiratory system: Clear to auscultation. Respiratory effort normal. Cardiovascular system:tachy Gastrointestinal system: Abdomen is nondistended, soft and nontender. No organomegaly or masses felt. Normal bowel sounds heard. Central nervous system: awake oriented to hospital Extremities: RUE edema   Data Reviewed: I have personally reviewed following labs  and imaging studies  CBC: Recent Labs  Lab 01/25/24 2104 01/25/24 2112 01/27/24 0718 01/28/24 0559 01/29/24 0537  WBC 8.0  --  5.0 5.4 5.8  NEUTROABS 4.9  --   --   --   --   HGB 16.2 17.0 14.1 13.1 12.5*  HCT 44.7 50.0 40.2 38.1* 36.5*  MCV 99.3  --  103.3* 103.8* 105.2*  PLT 136*  --  80* 82* 86*   Basic Metabolic Panel: Recent Labs  Lab 01/25/24 2250 01/26/24 0435 01/26/24 0609 01/26/24 1246 01/27/24 0601 01/28/24 0559 01/29/24 0537  NA  --  130*  --  130* 128* 125* 132*  K  --  3.5  --  3.3* 3.7 3.3* 3.6  CL  --  88*  --  87* 90* 89* 100  CO2  --  28  --  29 29 27 22   GLUCOSE  --  107*  --  121* 116* 96 102*  BUN  --  <5*  --  <5* 6 7 10   CREATININE  --  0.71  --  0.56* 0.74 0.75 0.72  CALCIUM  --  8.4*  --  8.4* 7.8* 7.9* 8.1*  MG 1.6*  --  1.8  --   --  1.8  --    GFR: Estimated Creatinine Clearance: 125.3 mL/min (by C-G formula based on SCr of 0.72 mg/dL). Liver Function Tests: Recent Labs  Lab 01/25/24 2104 01/27/24 0601 01/28/24 0559 01/29/24 0537  AST 151* 96* 76* 89*  ALT 49* 35 32 35  ALKPHOS 144* 123 116 129*  BILITOT 1.3* 2.4* 2.4* 1.8*  PROT 7.4 6.6 6.5 6.1*  ALBUMIN 3.7 3.3* 3.1* 3.1*   Recent Labs  Lab 01/25/24 2104  LIPASE 49   No results for input(s): AMMONIA in the last 168 hours. Coagulation Profile: No results for input(s): INR, PROTIME in the last 168 hours. Cardiac Enzymes: No results for input(s): CKTOTAL, CKMB, CKMBINDEX, TROPONINI in the last 168 hours. BNP (last 3 results) No results for input(s): PROBNP in the last 8760 hours. HbA1C: No results for input(s): HGBA1C in the last 72 hours. CBG: No results for input(s): GLUCAP in the last 168 hours. Lipid Profile: No results for input(s): CHOL, HDL, LDLCALC, TRIG, CHOLHDL, LDLDIRECT in the last 72 hours. Thyroid  Function Tests: No results for input(s): TSH, T4TOTAL, FREET4, T3FREE, THYROIDAB in the last 72 hours. Anemia  Panel: No results for input(s): VITAMINB12, FOLATE, FERRITIN, TIBC, IRON, RETICCTPCT in the last 72 hours. Sepsis Labs: Recent Labs  Lab 01/25/24 2250 01/26/24 0128 01/26/24 0650 01/26/24 1246  LATICACIDVEN 4.2* 3.8* 3.2* 2.9*    Recent Results (from the past 240 hours)  Gastrointestinal Panel by PCR , Stool     Status: None   Collection Time: 01/26/24 11:54 PM   Specimen: Stool  Result Value Ref Range Status   Campylobacter species NOT DETECTED NOT DETECTED Final   Plesimonas shigelloides NOT DETECTED  NOT DETECTED Final   Salmonella species NOT DETECTED NOT DETECTED Final   Yersinia enterocolitica NOT DETECTED NOT DETECTED Final   Vibrio species NOT DETECTED NOT DETECTED Final   Vibrio cholerae NOT DETECTED NOT DETECTED Final   Enteroaggregative E coli (EAEC) NOT DETECTED NOT DETECTED Final   Enteropathogenic E coli (EPEC) NOT DETECTED NOT DETECTED Final   Enterotoxigenic E coli (ETEC) NOT DETECTED NOT DETECTED Final   Shiga like toxin producing E coli (STEC) NOT DETECTED NOT DETECTED Final   Shigella/Enteroinvasive E coli (EIEC) NOT DETECTED NOT DETECTED Final   Cryptosporidium NOT DETECTED NOT DETECTED Final   Cyclospora cayetanensis NOT DETECTED NOT DETECTED Final   Entamoeba histolytica NOT DETECTED NOT DETECTED Final   Giardia lamblia NOT DETECTED NOT DETECTED Final   Adenovirus F40/41 NOT DETECTED NOT DETECTED Final   Astrovirus NOT DETECTED NOT DETECTED Final   Norovirus GI/GII NOT DETECTED NOT DETECTED Final   Rotavirus A NOT DETECTED NOT DETECTED Final   Sapovirus (I, II, IV, and V) NOT DETECTED NOT DETECTED Final    Comment: Performed at [facility], [address], [place], [place] [address]         Radiology Studies: No results found.   Scheduled Meds:  carvedilol   6.25 mg Oral BID   chlordiazePOXIDE   50 mg Oral TID   folic acid   1 mg Oral Daily   guaiFENesin   600 mg Oral BID   multivitamin with minerals  1 tablet Oral Daily    nicotine   21 mg Transdermal Daily   pantoprazole   40 mg Oral Daily   phenobarbital   64.8 mg Oral Q8H   Followed by   NOREEN ON 01/30/2024] phenobarbital   32.4 mg Oral Q8H   sodium chloride  flush  3 mL Intravenous Q12H   sodium chloride  flush  3 mL Intravenous Q12H   thiamine   100 mg Oral Daily   Or   thiamine   100 mg Intravenous Daily   Continuous Infusions:   LOS: 3 days    Time spent: 39 min    Almarie KANDICE Hoots, MD  01/29/2024, 2:34 PM

## 2024-01-29 NOTE — Plan of Care (Signed)

## 2024-01-29 NOTE — Progress Notes (Signed)
    Patient Name: NEIKO TRIVEDI           DOB: 09/22/1970  MRN: [id number]      Admission Date: 01/25/2024  Attending Provider: Will Almarie MATSU, MD  Primary Diagnosis: Alcohol withdrawal [facility])   Level of care: Progressive    CROSS COVER NOTE   Date of Service   01/29/2024   Jennifer Holland Porto, 53 y.o. male, was admitted on 01/25/2024 for Alcohol withdrawal (HCC).    HPI/Events of Note   Witnessed fall Fall occurred while patient was ambulating unassisted despite having safety sitter at bedside. Ambulation was unsteady, patient fell to the floor and staff reports pt's head made contact with the wall.  No loss of consciousness. Mentation same as prior-  alert to self. No neuro changes. Tremors noted-  not new. There are no visible sign of injury or deformities. He verbally denies pain or any discomfort. No change in ROM. He was assisted back to bed, CIWA relatively high and agitated.  RN administered 4 mg Ativan  IV. Vitals are hemodynamically stable.   RN staff reports pt is threatening to fall again if they try to touch him.    Interventions/ Plan   Nursing staff to continue monitoring for change in acute symptoms, mobility, and pain. Continue CIWA and ativan , phenobarbital , librium  for ETOH w/d.  Continue Recruitment consultant Restraint in place- d/c as soon as level of agitation has improved         Eduardo Honor, DNP, ACNPC- AG [facility] [place]

## 2024-01-29 NOTE — Progress Notes (Signed)
   01/29/24 0228  What Happened  Was fall witnessed? Yes  Who witnessed fall? Venetia, RN  Patients activity before fall bathroom-assisted  Point of contact hip/leg;arm/shoulder;head  Was patient injured? No  Provider Notification  Provider Name/Title Andrez, NP  Date Provider Notified 01/29/24  Time Provider Notified 0228  Method of Notification Page  Notification Reason Fall  Adult Fall Risk Assessment  Risk Factor Category (scoring not indicated) High fall risk per protocol (document High fall risk)  Patient Fall Risk Level High fall risk  Adult Fall Risk Interventions  Required Bundle Interventions *See Row Information* High fall risk - low, moderate, and high requirements implemented  Additional Interventions PT/OT need assessed if change in mobility from baseline;Use of appropriate toileting equipment (bedpan, BSC, etc.)  Fall intervention(s) refused/Patient educated regarding refusal Bed alarm;Nonskid socks;Open door if unsupervised;Yellow bracelet;Supervision while toileting/edge of bed sitting  Screening for Fall Injury Risk (To be completed on HIGH fall risk patients) - Assessing Need for Floor Mats  Risk For Fall Injury- Criteria for Floor Mats Previous fall this admission  Will Implement Floor Mats Yes   Pt had witnessed fall from bsc to fall matt, then bumped left side of head on floor off the edge. Pt assessed and assisted back to bed. Skin check revealed no signs of injury. PT states no pain. NP notified.

## 2024-01-30 ENCOUNTER — Inpatient Hospital Stay (HOSPITAL_COMMUNITY)

## 2024-01-30 DIAGNOSIS — F1010 Alcohol abuse, uncomplicated: Secondary | ICD-10-CM | POA: Diagnosis not present

## 2024-01-30 DIAGNOSIS — G319 Degenerative disease of nervous system, unspecified: Secondary | ICD-10-CM | POA: Diagnosis not present

## 2024-01-30 LAB — COMPREHENSIVE METABOLIC PANEL WITH GFR
ALT: 38 U/L (ref 0–44)
AST: 86 U/L — ABNORMAL HIGH (ref 15–41)
Albumin: 3.3 g/dL — ABNORMAL LOW (ref 3.5–5.0)
Alkaline Phosphatase: 147 U/L — ABNORMAL HIGH (ref 38–126)
Anion gap: 8 (ref 5–15)
BUN: 9 mg/dL (ref 6–20)
CO2: 24 mmol/L (ref 22–32)
Calcium: 8.4 mg/dL — ABNORMAL LOW (ref 8.9–10.3)
Chloride: 101 mmol/L (ref 98–111)
Creatinine, Ser: 0.71 mg/dL (ref 0.61–1.24)
GFR, Estimated: >60 mL/min (ref >60)
Glucose, Bld: 125 mg/dL — ABNORMAL HIGH (ref 70–99)
Potassium: 3.3 mmol/L — ABNORMAL LOW (ref 3.5–5.1)
Sodium: 133 mmol/L — ABNORMAL LOW (ref 135–145)
Total Bilirubin: 1.6 mg/dL — ABNORMAL HIGH (ref 0.0–1.2)
Total Protein: 6.6 g/dL (ref 6.5–8.1)

## 2024-01-30 LAB — CBC
HCT: 37.5 % — ABNORMAL LOW (ref 39.0–52.0)
Hemoglobin: 12.5 g/dL — ABNORMAL LOW (ref 13.0–17.0)
MCH: 35.4 pg — ABNORMAL HIGH (ref 26.0–34.0)
MCHC: 33.3 g/dL (ref 30.0–36.0)
MCV: 106.2 fL — ABNORMAL HIGH (ref 80.0–100.0)
Platelets: 97 K/uL — ABNORMAL LOW (ref 150–400)
RBC: 3.53 MIL/uL — ABNORMAL LOW (ref 4.22–5.81)
RDW: 13.4 % (ref 11.5–15.5)
WBC: 4.9 K/uL (ref 4.0–10.5)
nRBC: 0 % (ref 0.0–0.2)

## 2024-01-30 LAB — MRSA NEXT GEN BY PCR, NASAL: MRSA by PCR Next Gen: NOT DETECTED

## 2024-01-30 MED ORDER — PHENOBARBITAL 32.4 MG PO TABS: 32.4000 mg | ORAL_TABLET | Freq: Three times a day (TID) | ORAL | Status: DC

## 2024-01-30 MED ORDER — POTASSIUM CHLORIDE 10 MEQ/100ML IV SOLN: 10.0000 meq | INTRAVENOUS | Status: DC

## 2024-01-30 MED ORDER — MAGNESIUM SULFATE 2 GM/50ML IV SOLN
2.0000 g | Freq: Once | INTRAVENOUS | Status: AC
  Administered 2024-01-30: 2 g via INTRAVENOUS
  Filled 2024-01-30: qty 50

## 2024-01-30 MED ORDER — CHLORHEXIDINE GLUCONATE CLOTH 2 % EX PADS
6.0000 | MEDICATED_PAD | Freq: Every day | CUTANEOUS | Status: DC
  Administered 2024-01-30: 6 via TOPICAL

## 2024-01-30 MED ORDER — PHENOBARBITAL SODIUM 65 MG/ML IJ SOLN: 65.0000 mg | Freq: Three times a day (TID) | INTRAMUSCULAR | Status: DC

## 2024-01-30 MED ORDER — PHENOBARBITAL SODIUM 65 MG/ML IJ SOLN: 32.5000 mg | Freq: Three times a day (TID) | INTRAMUSCULAR | Status: DC

## 2024-01-30 MED ORDER — PHENOBARBITAL 32.4 MG PO TABS: 97.2000 mg | ORAL_TABLET | Freq: Three times a day (TID) | ORAL | Status: DC

## 2024-01-30 MED ORDER — POTASSIUM CHLORIDE 10 MEQ/100ML IV SOLN
10.0000 meq | INTRAVENOUS | Status: AC
  Administered 2024-01-30 (×2): 10 meq via INTRAVENOUS
  Filled 2024-01-30 (×2): qty 100

## 2024-01-30 MED ORDER — PHENOBARBITAL SODIUM 130 MG/ML IJ SOLN
97.5000 mg | Freq: Three times a day (TID) | INTRAMUSCULAR | Status: DC
  Administered 2024-01-30 – 2024-01-31 (×3): 97.5 mg via INTRAVENOUS
  Filled 2024-01-30 (×3): qty 1

## 2024-01-30 MED ORDER — ORAL CARE MOUTH RINSE: 15.0000 mL | OROMUCOSAL | Status: DC | PRN

## 2024-01-30 MED ORDER — PANTOPRAZOLE SODIUM 40 MG IV SOLR: 40.0000 mg | INTRAVENOUS | Status: DC

## 2024-01-30 MED ORDER — SODIUM CHLORIDE 0.9 % IV SOLN: INTRAVENOUS | Status: AC | PRN

## 2024-01-30 MED ORDER — PANTOPRAZOLE SODIUM 40 MG IV SOLR
40.0000 mg | INTRAVENOUS | Status: DC
  Administered 2024-01-30 – 2024-01-31 (×2): 40 mg via INTRAVENOUS
  Filled 2024-01-30 (×2): qty 10

## 2024-01-30 MED ORDER — PHENOBARBITAL 32.4 MG PO TABS: 64.8000 mg | ORAL_TABLET | Freq: Three times a day (TID) | ORAL | Status: DC

## 2024-01-30 NOTE — Progress Notes (Signed)
 Patient is transferred to ICU, bedside report given to ICU RN. Family member notified.

## 2024-01-30 NOTE — Progress Notes (Signed)
 PT Cancellation Note  Patient Details Name: Austin Brown MRN: [id number] DOB: 1971-05-07   Cancelled Treatment:    Reason Eval/Treat Not Completed: Medical issues which prohibited therapy  Pt now on SDU.  Per MD note today, Seen in stepdown unit after fall on the floor where he got up by himself and started walking and hit his head on the sidewall currently he is sedated with Ativan  phenobarb higher dose started and Librium  being continued   Pt has had 3 medical related cancels.  PT will sign off at this time.  Please reorder once pt is medically ready to participate.   Tari LITTIE Payson 01/30/2024, 3:43 PM Tari KLEIN, DPT Physical Therapist Acute Rehabilitation Services Office: [phone]

## 2024-01-30 NOTE — Plan of Care (Signed)
  Problem: Elimination: Goal: Will not experience complications related to urinary retention Outcome: Progressing   Problem: Pain Managment: Goal: General experience of comfort will improve and/or be controlled Outcome: Progressing   Problem: Safety: Goal: Ability to remain free from injury will improve Outcome: Progressing   Problem: Skin Integrity: Goal: Risk for impaired skin integrity will decrease Outcome: Progressing   Problem: Safety: Goal: Non-violent Restraint(s) Outcome: Not Progressing

## 2024-01-30 NOTE — Progress Notes (Signed)
 PROGRESS NOTE    Austin Brown  FMW:[id number] DOB: 1971/05/26 DOA: 01/25/2024 PCP: Clarice Nottingham, MD   Brief Narrative:53 year old man medical history of asthma, essential hypertension, reactive airway disease, marijuana smoking, cigarette smoking, chronic alcohol use and chronic thrombocytopenia presented emergency department complaining of nausea, vomiting, diarrhea and abdominal discomfort. Last drink of alcohol today in the afternoon. Reports symptoms ongoing for last 4 days with epigastric discomfort. Believes his potassium is likely low and took 2 potassium tablets at home. Believes that his symptoms are likely due to alcohol withdrawal although endorses that her last drink was around 3 or 4 PM this afternoon.Typically drinks 6-7 malt beers that are 10% alcohol by weight. Denies any recent substance use beyond a little bit of marijuana. He believes his abdominal pain is likely from the vomiting that he has been experiencing. Denies any recent fever, chills or bodyaches. Patient is also complaining about mid central chest pain.  Reported he feels chest pain with cough.  Has been smoking cigarette and marijuana.     At presentation to ED patient is hemodynamically stable. Elevated lactic acid 3.8.  Low mag level 1.6.  Blood alcohol level 311.  Normal lipase level 49.  CMP showing low sodium 125, low potassium 2.8, low chloride 81, elevated AST/ALT ALP 151/49/144/1.3. CBC unremarkable except low platelet count 136 at baseline. Troponin x 2 within normal range. EKG showed normal sinus rhythm heart rate 98, low voltage precordial leads. Chest x-ray no active disease process. In the ED CIWA protocol and Ativan  prn has been initiated.  Also patient being started on any 75 cc/h.   Assessment & Plan:   Principal Problem:   Alcohol withdrawal (HCC) Active Problems:   Chronic alcohol use   Lactic acidosis   Hyponatremia   Hypokalemia   Nausea and vomiting   Diarrhea   Chronic idiopathic  thrombocytopenia (HCC)   Hepatic steatosis   Generalized anxiety disorder   Transaminitis   Marijuana smoker, episodic   Continuous dependence on cigarette smoking   Reactive airway disease   Alcohol withdrawal delirium, acute, hyperactive (HCC)   Alcohol withdrawal on admission his blood alcohol level was 311.  Urine drug screen was positive for THC benzos and barbiturates.(This was done on the 11th while he was on Ativan  and barbiturates.)  He was admitted with complaints of nausea vomiting diarrhea abdominal pain 4 days prior to admission.  He drinks about 8 malt beers daily.  Last drink was on the seventh evening.  He was admitted to the floor initially on phenobarbital  and CIWA protocol with Ativan .  He continued to get more delirious was unable to direct him on the floor he was transferred to the stepdown unit after 2 or 3 falls and hitting his head on the wall and a CT of this head shows no evidence of acute findings other than generalized atrophy advanced for his age.  He was requiring high dose of Ativan  30 mg daily on the floor along with phenobarb.  Librium  was added 50 mg 3 times daily.   Hyponatremia na 133 from 132 from 125 from 128 from 130 improved with normal saline.  Zoloft was on hold due to hyponatremia.   Hypokalemia replete potassium.  Mag level 1.8. Hypomagnesemia 1.8   Nausea, vomiting and diarrhea resolved -Patient reported nausea, vomiting and diarrhea with central chest pain for 3 to 4 days.  Reported known biliary nonbloody vomiting.  Has 1-2 episodes of loose stool on daily basis. Concern for nausea, vomiting and  diarrhea in setting of marijuana use, alcohol withdrawal.  Patient is afebrile.  No evidence of leukocytosis.  Lactic acidosis secondary to poor hepatic clearance and type B lactic acidosis rather than elevation from any infectious source. It is also possible that patient might have some viral gastroenteritis.  GI panel negative   Lactic acidosis-type  B -Initial lactic acid 4.1 which has been improved to 3.8 after receiving 1 L of LR bolus in the ED.  Type B lactic acidosis in the setting of poor hepatic clearance from underlying hepatic steatosis and chronic alcohol use related transaminitis.  Also metformin use causing lactic acidosis as well.   Generalized anxiety disorder -Holding Zoloft in the setting of hyponatremia   Transaminitis Hepatic steatosis improved   Per chart review patient has chronic AST elevation and hyperbilirubinemia.  Bilirubin 1.3 today. - Transaminitis in the setting of hepatic steatosis and chronic alcohol use. - Negative hepatitis panel.  Continue to monitor hepatic function.   Essential hypertension -Continue Coreg  6.25 mg twice daily   Noncardiac chest pain continue Protonix  - Patient is complaining about mid central chest pain.  Patient reported coughing causing the chest pain.  History of chronic smoking cigarette and reactive airway disease.  No established diagnosis of COPD. -Troponin x 2 within normal range.  EKG unremarkable.  At this time there is no concern for acute coronary syndrome.   -Noncardiac chest pain in the setting of pleural irritation from cough/gastritis     History of prediabetes - Holding metformin in setting of lactic acidosis.  Per chart review most recent A1c 4.9 in 09/2023.     History of prolonged QTc - EKG showing normal QTc interval at this time.   Chronic smoking cigarette History of reactive airway disease - Patient reported he used albuterol  inhaler as needed at home.  Continues to smoke cigarette on daily basis - Continue nicotine  patch. - Starting DuoNeb nebulizer as needed.   Estimated body mass index is 28.6 kg/m as calculated from the following:   Height as of this encounter: 5' 10 (1.778 m).   Weight as of this encounter: 90.4 kg.  DVT prophylaxis: lovenox  Code Status: full Family Communication:none Disposition Plan:  Status is: Inpatient Remains  inpatient appropriate because: acute illness   Antimicrobials: none  Subjective:  Seen in stepdown unit after fall on the floor where he got up by himself and started walking and hit his head on the sidewall currently he is sedated with Ativan  phenobarb higher dose started and Librium  being continued Objective: Vitals:   01/30/24 1000 01/30/24 1100 01/30/24 1200 01/30/24 1300  BP: (!) 143/94 (!) 138/95 (!) 150/90 (!) 152/91  Pulse: 65 64 64 64  Resp: 20 18 16 17   Temp:  97.6 F (36.4 C)    TempSrc:  Axillary    SpO2: 97% 96% 96% 96%  Weight:      Height:        Intake/Output Summary (Last 24 hours) at 01/30/2024 1425 Last data filed at 01/30/2024 1208 Gross per 24 hour  Intake 2727.25 ml  Output 400 ml  Net 2327.25 ml   Filed Weights   01/25/24 2026 01/30/24 0220  Weight: 95.4 kg 90.4 kg    Examination:  General exam: Sedated, condom cath in place respiratory system: Clear to auscultation. Respiratory effort normal. Cardiovascular system:tachy Gastrointestinal system: Abdomen is nondistended, soft and nontender. No organomegaly or masses felt. Normal bowel sounds heard. Central nervous system: awake oriented to hospital Extremities: No edema  Data Reviewed:  I have personally reviewed following labs and imaging studies  CBC: Recent Labs  Lab 01/25/24 2104 01/25/24 2112 01/27/24 0718 01/28/24 0559 01/29/24 0537 01/30/24 0250  WBC 8.0  --  5.0 5.4 5.8 4.9  NEUTROABS 4.9  --   --   --   --   --   HGB 16.2 17.0 14.1 13.1 12.5* 12.5*  HCT 44.7 50.0 40.2 38.1* 36.5* 37.5*  MCV 99.3  --  103.3* 103.8* 105.2* 106.2*  PLT 136*  --  80* 82* 86* 97*   Basic Metabolic Panel: Recent Labs  Lab 01/25/24 2250 01/26/24 0435 01/26/24 0609 01/26/24 1246 01/27/24 0601 01/28/24 0559 01/29/24 0537 01/30/24 0250  NA  --    < >  --  130* 128* 125* 132* 133*  K  --    < >  --  3.3* 3.7 3.3* 3.6 3.3*  CL  --    < >  --  87* 90* 89* 100 101  CO2  --    < >  --  29 29 27 22  24   GLUCOSE  --    < >  --  121* 116* 96 102* 125*  BUN  --    < >  --  <5* 6 7 10 9   CREATININE  --    < >  --  0.56* 0.74 0.75 0.72 0.71  CALCIUM  --    < >  --  8.4* 7.8* 7.9* 8.1* 8.4*  MG 1.6*  --  1.8  --   --  1.8  --   --    < > = values in this interval not displayed.   GFR: Estimated Creatinine Clearance: 122.2 mL/min (by C-G formula based on SCr of 0.71 mg/dL). Liver Function Tests: Recent Labs  Lab 01/25/24 2104 01/27/24 0601 01/28/24 0559 01/29/24 0537 01/30/24 0250  AST 151* 96* 76* 89* 86*  ALT 49* 35 32 35 38  ALKPHOS 144* 123 116 129* 147*  BILITOT 1.3* 2.4* 2.4* 1.8* 1.6*  PROT 7.4 6.6 6.5 6.1* 6.6  ALBUMIN 3.7 3.3* 3.1* 3.1* 3.3*   Recent Labs  Lab 01/25/24 2104  LIPASE 49   No results for input(s): AMMONIA in the last 168 hours. Coagulation Profile: No results for input(s): INR, PROTIME in the last 168 hours. Cardiac Enzymes: No results for input(s): CKTOTAL, CKMB, CKMBINDEX, TROPONINI in the last 168 hours. BNP (last 3 results) No results for input(s): PROBNP in the last 8760 hours. HbA1C: No results for input(s): HGBA1C in the last 72 hours. CBG: No results for input(s): GLUCAP in the last 168 hours. Lipid Profile: No results for input(s): CHOL, HDL, LDLCALC, TRIG, CHOLHDL, LDLDIRECT in the last 72 hours. Thyroid  Function Tests: No results for input(s): TSH, T4TOTAL, FREET4, T3FREE, THYROIDAB in the last 72 hours. Anemia Panel: No results for input(s): VITAMINB12, FOLATE, FERRITIN, TIBC, IRON, RETICCTPCT in the last 72 hours. Sepsis Labs: Recent Labs  Lab 01/25/24 2250 01/26/24 0128 01/26/24 0650 01/26/24 1246  LATICACIDVEN 4.2* 3.8* 3.2* 2.9*    Recent Results (from the past 240 hours)  Gastrointestinal Panel by PCR , Stool     Status: None   Collection Time: 01/26/24 11:54 PM   Specimen: Stool  Result Value Ref Range Status   Campylobacter species NOT DETECTED NOT DETECTED Final    Plesimonas shigelloides NOT DETECTED NOT DETECTED Final   Salmonella species NOT DETECTED NOT DETECTED Final   Yersinia enterocolitica NOT DETECTED NOT DETECTED Final   Vibrio species NOT  DETECTED NOT DETECTED Final   Vibrio cholerae NOT DETECTED NOT DETECTED Final   Enteroaggregative E coli (EAEC) NOT DETECTED NOT DETECTED Final   Enteropathogenic E coli (EPEC) NOT DETECTED NOT DETECTED Final   Enterotoxigenic E coli (ETEC) NOT DETECTED NOT DETECTED Final   Shiga like toxin producing E coli (STEC) NOT DETECTED NOT DETECTED Final   Shigella/Enteroinvasive E coli (EIEC) NOT DETECTED NOT DETECTED Final   Cryptosporidium NOT DETECTED NOT DETECTED Final   Cyclospora cayetanensis NOT DETECTED NOT DETECTED Final   Entamoeba histolytica NOT DETECTED NOT DETECTED Final   Giardia lamblia NOT DETECTED NOT DETECTED Final   Adenovirus F40/41 NOT DETECTED NOT DETECTED Final   Astrovirus NOT DETECTED NOT DETECTED Final   Norovirus GI/GII NOT DETECTED NOT DETECTED Final   Rotavirus A NOT DETECTED NOT DETECTED Final   Sapovirus (I, II, IV, and V) NOT DETECTED NOT DETECTED Final    Comment: Performed at [facility], [address] [place], [place] [address]  MRSA Next Gen by PCR, Nasal     Status: None   Collection Time: 01/30/24  1:05 AM   Specimen: Nasal Mucosa; Nasal Swab  Result Value Ref Range Status   MRSA by PCR Next Gen NOT DETECTED NOT DETECTED Final    Comment: (NOTE) The GeneXpert MRSA Assay (FDA approved for NASAL specimens only), is one component of a comprehensive MRSA colonization surveillance program. It is not intended to diagnose MRSA infection nor to guide or monitor treatment for MRSA infections. Test performance is not FDA approved in patients less than 34 years old. Performed at [facility], [address] [place], [place] [address]          Radiology Studies: CT HEAD WO CONTRAST ( ) Result Date: 01/30/2024 EXAM: CT HEAD WITHOUT  01/30/2024 09:17:44 AM TECHNIQUE: CT of the head was performed without the administration of intravenous contrast. Automated exposure control, iterative reconstruction, and/or weight based adjustment of the mA/kV was utilized to reduce the radiation dose to as low as reasonably achievable. COMPARISON: CT head without contrast 01/29/2014. CLINICAL HISTORY: Delirium. Scan due to delirium. Principal problem: alcohol withdrawal. Patient complains of diarrhea and chest pain. FINDINGS: BRAIN AND VENTRICLES: No acute intracranial hemorrhage. No mass effect or midline shift. No extra-axial fluid collection. Gray-white differentiation is maintained. No hydrocephalus. Mild generalized atrophy is somewhat advanced for age. ORBITS: No acute abnormality. SINUSES AND MASTOIDS: No acute abnormality. SOFT TISSUES AND SKULL: No acute skull fracture. No acute soft tissue abnormality. IMPRESSION: 1. No acute intracranial abnormality. 2. Mild generalized atrophy, somewhat advanced for age. Electronically signed by: Lonni Necessary MD 01/30/2024 09:41 AM EDT RP Workstation: [id number]     Scheduled Meds:  carvedilol   6.25 mg Oral BID   chlordiazePOXIDE   50 mg Oral TID   Chlorhexidine  Gluconate Cloth  6 each Topical Daily   folic acid   1 mg Oral Daily   guaiFENesin   600 mg Oral BID   multivitamin with minerals  1 tablet Oral Daily   nicotine   21 mg Transdermal Daily   pantoprazole  (PROTONIX ) IV  40 mg Intravenous Q24H   PHENObarbital   97.5 mg Intravenous Q8H   Followed by   [START ON 02/01/2024] PHENObarbital   65 mg Intravenous Q8H   Followed by   [START ON 02/03/2024] PHENObarbital   32.5 mg Intravenous Q8H   sodium chloride  flush  3 mL Intravenous Q12H   thiamine   100 mg Oral Daily   Or   thiamine   100 mg Intravenous Daily  Continuous Infusions:  sodium chloride  75 mL/hr at 01/30/24 1321   sodium chloride  10 mL/hr at 01/30/24 1208   dexmedetomidine  (PRECEDEX ) IV infusion 0.4 mcg/kg/hr (01/30/24 1321)      LOS: 4 days    Time spent: 39 min    Almarie KANDICE Hoots, MD  01/30/2024, 2:25 PM

## 2024-01-30 NOTE — Progress Notes (Incomplete)
    Patient Name: NAYDEN CZAJKA           DOB: November 12, 1970  MRN: [id number]      Admission Date: 01/25/2024  Attending Provider: Will Almarie MATSU, MD  Primary Diagnosis: Alcohol withdrawal [facility])   Level of care: Progressive    CROSS COVER NOTE   Date of Service   01/29/2024   Bronson Bressman Mergen, 53 y.o. male, was admitted on 01/25/2024 for Alcohol withdrawal (HCC).    HPI/Events of Note   Witnessed fall Fall occurred while patient was ambulating unassisted despite having safety sitter at bedside. Ambulation was unsteady, patient fell to the floor and staff reports pt's head made contact with the wall.  No loss of consciousness. Mentation same as prior-  alert to self. No neuro changes. Tremors noted-  not new. There are no visible sign of injury or deformities. He verbally denies pain or any discomfort. No change in ROM. He was assisted back to bed, CIWA relatively high and agitated.  RN administered 4 mg Ativan  IV. Vitals are hemodynamically stable.   RN staff reports pt is threatening to fall again if they try to touch him.    Interventions/ Plan   Nursing staff to continue monitoring for change in acute symptoms, mobility, and pain. Continue CIWA and ativan , phenobarbital , librium  for ETOH w/d.  Continue safety sitter Restraint in place- d/c as soon as level of agitation has improved      Addendum-  2355- Patient is being physically aggressive with staff- hitting and kicking. Unable to be redirected. He has received 12 mg of ativan  in the past 4 hours without relief. Also on received librium ;  he is refusing current PO phenobarbital  dose.   Transferring pt to SDU for closer monitoring and frequent need of PRN ativan . Start precedex . Monitor Qtc. Last measured Qtc - 0.46.    Zanaria Morell, DNP, ACNPC- AG [facility] [place]

## 2024-01-31 DIAGNOSIS — F1010 Alcohol abuse, uncomplicated: Secondary | ICD-10-CM | POA: Diagnosis not present

## 2024-01-31 LAB — COMPREHENSIVE METABOLIC PANEL WITH GFR
ALT: 38 U/L (ref 0–44)
AST: 94 U/L — ABNORMAL HIGH (ref 15–41)
Albumin: 3.1 g/dL — ABNORMAL LOW (ref 3.5–5.0)
Alkaline Phosphatase: 114 U/L (ref 38–126)
Anion gap: 8 (ref 5–15)
BUN: 6 mg/dL (ref 6–20)
CO2: 22 mmol/L (ref 22–32)
Calcium: 8 mg/dL — ABNORMAL LOW (ref 8.9–10.3)
Chloride: 103 mmol/L (ref 98–111)
Creatinine, Ser: 0.59 mg/dL — ABNORMAL LOW (ref 0.61–1.24)
GFR, Estimated: >60 mL/min (ref >60)
Glucose, Bld: 85 mg/dL (ref 70–99)
Potassium: 3.5 mmol/L (ref 3.5–5.1)
Sodium: 133 mmol/L — ABNORMAL LOW (ref 135–145)
Total Bilirubin: 1.5 mg/dL — ABNORMAL HIGH (ref 0.0–1.2)
Total Protein: 6.4 g/dL — ABNORMAL LOW (ref 6.5–8.1)

## 2024-01-31 LAB — CBC
HCT: 38.3 % — ABNORMAL LOW (ref 39.0–52.0)
Hemoglobin: 12.7 g/dL — ABNORMAL LOW (ref 13.0–17.0)
MCH: 35.7 pg — ABNORMAL HIGH (ref 26.0–34.0)
MCHC: 33.2 g/dL (ref 30.0–36.0)
MCV: 107.6 fL — ABNORMAL HIGH (ref 80.0–100.0)
Platelets: 104 K/uL — ABNORMAL LOW (ref 150–400)
RBC: 3.56 MIL/uL — ABNORMAL LOW (ref 4.22–5.81)
RDW: 13.4 % (ref 11.5–15.5)
WBC: 4.6 K/uL (ref 4.0–10.5)
nRBC: 0 % (ref 0.0–0.2)

## 2024-01-31 LAB — MAGNESIUM: Magnesium: 2 mg/dL (ref 1.7–2.4)

## 2024-01-31 MED ORDER — PANTOPRAZOLE SODIUM 40 MG PO TBEC
40.0000 mg | DELAYED_RELEASE_TABLET | Freq: Every day | ORAL | Status: DC
  Administered 2024-01-31 – 2024-02-02 (×3): 40 mg via ORAL
  Filled 2024-01-31 (×3): qty 1

## 2024-01-31 MED ORDER — CHLORDIAZEPOXIDE HCL 25 MG PO CAPS
25.0000 mg | ORAL_CAPSULE | Freq: Three times a day (TID) | ORAL | Status: DC
  Administered 2024-01-31 – 2024-02-02 (×6): 25 mg via ORAL
  Filled 2024-01-31 (×6): qty 1

## 2024-01-31 MED ORDER — HYDRALAZINE HCL 20 MG/ML IJ SOLN
5.0000 mg | Freq: Four times a day (QID) | INTRAMUSCULAR | Status: AC | PRN
  Administered 2024-01-31 (×2): 5 mg via INTRAVENOUS
  Filled 2024-01-31 (×2): qty 1

## 2024-01-31 NOTE — Progress Notes (Signed)
 PROGRESS NOTE    Austin Brown  FMW:[id number] DOB: 1971-06-03 DOA: 01/25/2024 PCP: Clarice Nottingham, MD   Brief Narrative:53 year old man medical history of asthma, essential hypertension, reactive airway disease, marijuana smoking, cigarette smoking, chronic alcohol use and chronic thrombocytopenia presented emergency department complaining of nausea, vomiting, diarrhea and abdominal discomfort. Last drink of alcohol today in the afternoon. Reports symptoms ongoing for last 4 days with epigastric discomfort. Believes his potassium is likely low and took 2 potassium tablets at home. Believes that his symptoms are likely due to alcohol withdrawal although endorses that her last drink was around 3 or 4 PM this afternoon.Typically drinks 6-7 malt beers that are 10% alcohol by weight. Denies any recent substance use beyond a little bit of marijuana. He believes his abdominal pain is likely from the vomiting that he has been experiencing. Denies any recent fever, chills or bodyaches. Patient is also complaining about mid central chest pain.  Reported he feels chest pain with cough.  Has been smoking cigarette and marijuana.     At presentation to ED patient is hemodynamically stable. Elevated lactic acid 3.8.  Low mag level 1.6.  Blood alcohol level 311.  Normal lipase level 49.  CMP showing low sodium 125, low potassium 2.8, low chloride 81, elevated AST/ALT ALP 151/49/144/1.3. CBC unremarkable except low platelet count 136 at baseline. Troponin x 2 within normal range. EKG showed normal sinus rhythm heart rate 98, low voltage precordial leads. Chest x-ray no active disease process. In the ED CIWA protocol and Ativan  prn has been initiated.  Also patient being started on any 75 cc/h.   Assessment & Plan:   Principal Problem:   Alcohol withdrawal (HCC) Active Problems:   Chronic alcohol use   Lactic acidosis   Hyponatremia   Hypokalemia   Nausea and vomiting   Diarrhea   Chronic idiopathic  thrombocytopenia (HCC)   Hepatic steatosis   Generalized anxiety disorder   Transaminitis   Marijuana smoker, episodic   Continuous dependence on cigarette smoking   Reactive airway disease   Alcohol withdrawal delirium, acute, hyperactive (HCC)   Alcohol withdrawal on admission his blood alcohol level was 311.  Urine drug screen was positive for THC benzos and barbiturates.(This was done on the 11th while he was on Ativan  and barbiturates.)  He was admitted with complaints of nausea vomiting diarrhea abdominal pain 4 days prior to admission.  He drinks about 8 malt beers daily.  Last drink was on the seventh evening.  He was admitted to the floor initially on phenobarbital  and CIWA protocol with Ativan .  He continued to get more delirious was unable to direct him on the floor he was transferred to the stepdown unit after 2 or 3 falls and hitting his head on the wall and a CT of this head shows no evidence of acute findings other than generalized atrophy advanced for his age.  He was requiring high dose of Ativan  30 mg daily on the floor along with phenobarb.  Librium  25 mg 3 times daily.   Hyponatremia na 133 from 132 from 125 from 128 from 130 improved with normal saline.  Zoloft was on hold due to hyponatremia.   Hypokalemia replete potassium.  Mag level 1.8. Hypomagnesemia 2.0   Nausea, vomiting and diarrhea resolved -Patient reported nausea, vomiting and diarrhea with central chest pain for 3 to 4 days.  Reported known biliary nonbloody vomiting.  Has 1-2 episodes of loose stool on daily basis. Concern for nausea, vomiting and diarrhea in  setting of marijuana use, alcohol withdrawal.  Patient is afebrile.  No evidence of leukocytosis.  Lactic acidosis secondary to poor hepatic clearance and type B lactic acidosis rather than elevation from any infectious source. It is also possible that patient might have some viral gastroenteritis.  GI panel negative   Lactic acidosis-type B -Initial  lactic acid 4.1 which has been improved to 3.8 after receiving 1 L of LR bolus in the ED.  Type B lactic acidosis in the setting of poor hepatic clearance from underlying hepatic steatosis and chronic alcohol use related transaminitis.  Also metformin use causing lactic acidosis as well.   Generalized anxiety disorder -Holding Zoloft in the setting of hyponatremia   Transaminitis Hepatic steatosis improved   Per chart review patient has chronic AST elevation and hyperbilirubinemia.  Bilirubin 1.3 today. - Transaminitis in the setting of hepatic steatosis and chronic alcohol use. - Negative hepatitis panel.  Continue to monitor hepatic function.   Essential hypertension -Continue Coreg  6.25 mg twice daily   Noncardiac chest pain continue Protonix  - Patient is complaining about mid central chest pain.  Patient reported coughing causing the chest pain.  History of chronic smoking cigarette and reactive airway disease.  No established diagnosis of COPD. -Troponin x 2 within normal range.  EKG unremarkable.  At this time there is no concern for acute coronary syndrome.   -Noncardiac chest pain in the setting of pleural irritation from cough/gastritis     History of prediabetes - Holding metformin in setting of lactic acidosis.  Per chart review most recent A1c 4.9 in 09/2023.     History of prolonged QTc - EKG showing normal QTc interval at this time.   Chronic smoking cigarette History of reactive airway disease - Patient reported he used albuterol  inhaler as needed at home.  Continues to smoke cigarette on daily basis - Continue nicotine  patch. - Starting DuoNeb nebulizer as needed.   Estimated body mass index is 26.48 kg/m as calculated from the following:   Height as of this encounter: 5' 10 (1.778 m).   Weight as of this encounter: 83.7 kg.  DVT prophylaxis: lovenox  Code Status: full Family Communication:none Disposition Plan:  Status is: Inpatient Remains inpatient  appropriate because: acute illness   Antimicrobials: none  Subjective:  AWAKE alert oriented Answers questions appropriately  Follows commands  Objective: Vitals:   01/31/24 0400 01/31/24 0449 01/31/24 0500 01/31/24 0600  BP: 139/78  (!) 143/84 (!) 163/94  Pulse: 67  65 66  Resp: 18  18 19   Temp:  97.6 F (36.4 C)    TempSrc:  Axillary    SpO2: 96%  97% 97%  Weight:      Height:        Intake/Output Summary (Last 24 hours) at 01/31/2024 1136 Last data filed at 01/31/2024 0645 Gross per 24 hour  Intake 1930.58 ml  Output 1450 ml  Net 480.58 ml   Filed Weights   01/25/24 2026 01/30/24 0220 01/30/24 2333  Weight: 95.4 kg 90.4 kg 83.7 kg    Examination:  General exam:awake , condom cath in place respiratory system: Clear to auscultation. Respiratory effort normal. Cardiovascular system:tachy Gastrointestinal system: Abdomen is nondistended, soft and nontender. No organomegaly or masses felt. Normal bowel sounds heard. Central nervous system: awake oriented to hospital Extremities: No edema  Data Reviewed: I have personally reviewed following labs and imaging studies  CBC: Recent Labs  Lab 01/25/24 2104 01/25/24 2112 01/27/24 9281 01/28/24 0559 01/29/24 0537 01/30/24 0250 01/31/24 0250  WBC 8.0  --  5.0 5.4 5.8 4.9 4.6  NEUTROABS 4.9  --   --   --   --   --   --   HGB 16.2   < > 14.1 13.1 12.5* 12.5* 12.7*  HCT 44.7   < > 40.2 38.1* 36.5* 37.5* 38.3*  MCV 99.3  --  103.3* 103.8* 105.2* 106.2* 107.6*  PLT 136*  --  80* 82* 86* 97* 104*   < > = values in this interval not displayed.   Basic Metabolic Panel: Recent Labs  Lab 01/25/24 2250 01/26/24 0435 01/26/24 0609 01/26/24 1246 01/27/24 0601 01/28/24 0559 01/29/24 0537 01/30/24 0250 01/31/24 0250  NA  --    < >  --    < > 128* 125* 132* 133* 133*  K  --    < >  --    < > 3.7 3.3* 3.6 3.3* 3.5  CL  --    < >  --    < > 90* 89* 100 101 103  CO2  --    < >  --    < > 29 27 22 24 22   GLUCOSE  --     < >  --    < > 116* 96 102* 125* 85  BUN  --    < >  --    < > 6 7 10 9 6   CREATININE  --    < >  --    < > 0.74 0.75 0.72 0.71 0.59*  CALCIUM  --    < >  --    < > 7.8* 7.9* 8.1* 8.4* 8.0*  MG 1.6*  --  1.8  --   --  1.8  --   --  2.0   < > = values in this interval not displayed.   GFR: Estimated Creatinine Clearance: 111.5 mL/min (A) (by C-G formula based on SCr of 0.59 mg/dL (L)). Liver Function Tests: Recent Labs  Lab 01/27/24 0601 01/28/24 0559 01/29/24 0537 01/30/24 0250 01/31/24 0250  AST 96* 76* 89* 86* 94*  ALT 35 32 35 38 38  ALKPHOS 123 116 129* 147* 114  BILITOT 2.4* 2.4* 1.8* 1.6* 1.5*  PROT 6.6 6.5 6.1* 6.6 6.4*  ALBUMIN 3.3* 3.1* 3.1* 3.3* 3.1*   Recent Labs  Lab 01/25/24 2104  LIPASE 49   No results for input(s): AMMONIA in the last 168 hours. Coagulation Profile: No results for input(s): INR, PROTIME in the last 168 hours. Cardiac Enzymes: No results for input(s): CKTOTAL, CKMB, CKMBINDEX, TROPONINI in the last 168 hours. BNP (last 3 results) No results for input(s): PROBNP in the last 8760 hours. HbA1C: No results for input(s): HGBA1C in the last 72 hours. CBG: No results for input(s): GLUCAP in the last 168 hours. Lipid Profile: No results for input(s): CHOL, HDL, LDLCALC, TRIG, CHOLHDL, LDLDIRECT in the last 72 hours. Thyroid  Function Tests: No results for input(s): TSH, T4TOTAL, FREET4, T3FREE, THYROIDAB in the last 72 hours. Anemia Panel: No results for input(s): VITAMINB12, FOLATE, FERRITIN, TIBC, IRON, RETICCTPCT in the last 72 hours. Sepsis Labs: Recent Labs  Lab 01/25/24 2250 01/26/24 0128 01/26/24 0650 01/26/24 1246  LATICACIDVEN 4.2* 3.8* 3.2* 2.9*    Recent Results (from the past 240 hours)  Gastrointestinal Panel by PCR , Stool     Status: None   Collection Time: 01/26/24 11:54 PM   Specimen: Stool  Result Value Ref Range Status   Campylobacter species NOT DETECTED NOT  DETECTED Final   Plesimonas shigelloides NOT DETECTED NOT DETECTED Final   Salmonella species NOT DETECTED NOT DETECTED Final   Yersinia enterocolitica NOT DETECTED NOT DETECTED Final   Vibrio species NOT DETECTED NOT DETECTED Final   Vibrio cholerae NOT DETECTED NOT DETECTED Final   Enteroaggregative E coli (EAEC) NOT DETECTED NOT DETECTED Final   Enteropathogenic E coli (EPEC) NOT DETECTED NOT DETECTED Final   Enterotoxigenic E coli (ETEC) NOT DETECTED NOT DETECTED Final   Shiga like toxin producing E coli (STEC) NOT DETECTED NOT DETECTED Final   Shigella/Enteroinvasive E coli (EIEC) NOT DETECTED NOT DETECTED Final   Cryptosporidium NOT DETECTED NOT DETECTED Final   Cyclospora cayetanensis NOT DETECTED NOT DETECTED Final   Entamoeba histolytica NOT DETECTED NOT DETECTED Final   Giardia lamblia NOT DETECTED NOT DETECTED Final   Adenovirus F40/41 NOT DETECTED NOT DETECTED Final   Astrovirus NOT DETECTED NOT DETECTED Final   Norovirus GI/GII NOT DETECTED NOT DETECTED Final   Rotavirus A NOT DETECTED NOT DETECTED Final   Sapovirus (I, II, IV, and V) NOT DETECTED NOT DETECTED Final    Comment: Performed at [facility], [address] [place], [place] [address]  MRSA Next Gen by PCR, Nasal     Status: None   Collection Time: 01/30/24  1:05 AM   Specimen: Nasal Mucosa; Nasal Swab  Result Value Ref Range Status   MRSA by PCR Next Gen NOT DETECTED NOT DETECTED Final    Comment: (NOTE) The GeneXpert MRSA Assay (FDA approved for NASAL specimens only), is one component of a comprehensive MRSA colonization surveillance program. It is not intended to diagnose MRSA infection nor to guide or monitor treatment for MRSA infections. Test performance is not FDA approved in patients less than 68 years old. Performed at [facility], [address] [place], [place] [address]          Radiology Studies: CT HEAD WO CONTRAST ( ) Result Date: 01/30/2024 EXAM: CT  HEAD WITHOUT 01/30/2024 09:17:44 AM TECHNIQUE: CT of the head was performed without the administration of intravenous contrast. Automated exposure control, iterative reconstruction, and/or weight based adjustment of the mA/kV was utilized to reduce the radiation dose to as low as reasonably achievable. COMPARISON: CT head without contrast 01/29/2014. CLINICAL HISTORY: Delirium. Scan due to delirium. Principal problem: alcohol withdrawal. Patient complains of diarrhea and chest pain. FINDINGS: BRAIN AND VENTRICLES: No acute intracranial hemorrhage. No mass effect or midline shift. No extra-axial fluid collection. Gray-white differentiation is maintained. No hydrocephalus. Mild generalized atrophy is somewhat advanced for age. ORBITS: No acute abnormality. SINUSES AND MASTOIDS: No acute abnormality. SOFT TISSUES AND SKULL: No acute skull fracture. No acute soft tissue abnormality. IMPRESSION: 1. No acute intracranial abnormality. 2. Mild generalized atrophy, somewhat advanced for age. Electronically signed by: Lonni Necessary MD 01/30/2024 09:41 AM EDT RP Workstation: [id number]     Scheduled Meds:  carvedilol   6.25 mg Oral BID   Chlorhexidine  Gluconate Cloth  6 each Topical Daily   folic acid   1 mg Oral Daily   guaiFENesin   600 mg Oral BID   multivitamin with minerals  1 tablet Oral Daily   nicotine   21 mg Transdermal Daily   pantoprazole  (PROTONIX ) IV  40 mg Intravenous Q24H   PHENObarbital   97.5 mg Intravenous Q8H   Followed by   [START ON 02/01/2024] PHENObarbital   65 mg Intravenous Q8H   Followed by   [START ON 02/03/2024] PHENObarbital   32.5 mg Intravenous Q8H   sodium chloride  flush  3 mL Intravenous Q12H   thiamine   100 mg Oral Daily   Or   thiamine   100 mg Intravenous Daily   Continuous Infusions:  sodium chloride  75 mL/hr at 01/31/24 0400   dexmedetomidine  (PRECEDEX ) IV infusion 0.5 mcg/kg/hr (01/31/24 0450)     LOS: 5 days    Time spent: 39 min    Almarie KANDICE Hoots,  MD  01/31/2024, 11:36 AM

## 2024-02-01 DIAGNOSIS — F1010 Alcohol abuse, uncomplicated: Secondary | ICD-10-CM | POA: Diagnosis not present

## 2024-02-01 LAB — CBC
HCT: 38.8 % — ABNORMAL LOW (ref 39.0–52.0)
Hemoglobin: 12.7 g/dL — ABNORMAL LOW (ref 13.0–17.0)
MCH: 35.7 pg — ABNORMAL HIGH (ref 26.0–34.0)
MCHC: 32.7 g/dL (ref 30.0–36.0)
MCV: 109 fL — ABNORMAL HIGH (ref 80.0–100.0)
Platelets: 137 K/uL — ABNORMAL LOW (ref 150–400)
RBC: 3.56 MIL/uL — ABNORMAL LOW (ref 4.22–5.81)
RDW: 13.7 % (ref 11.5–15.5)
WBC: 5 K/uL (ref 4.0–10.5)
nRBC: 0 % (ref 0.0–0.2)

## 2024-02-01 LAB — COMPREHENSIVE METABOLIC PANEL WITH GFR
ALT: 41 U/L (ref 0–44)
AST: 76 U/L — ABNORMAL HIGH (ref 15–41)
Albumin: 3.4 g/dL — ABNORMAL LOW (ref 3.5–5.0)
Alkaline Phosphatase: 133 U/L — ABNORMAL HIGH (ref 38–126)
Anion gap: 8 (ref 5–15)
BUN: 8 mg/dL (ref 6–20)
CO2: 23 mmol/L (ref 22–32)
Calcium: 8.6 mg/dL — ABNORMAL LOW (ref 8.9–10.3)
Chloride: 102 mmol/L (ref 98–111)
Creatinine, Ser: 0.69 mg/dL (ref 0.61–1.24)
GFR, Estimated: >60 mL/min (ref >60)
Glucose, Bld: 99 mg/dL (ref 70–99)
Potassium: 3.2 mmol/L — ABNORMAL LOW (ref 3.5–5.1)
Sodium: 133 mmol/L — ABNORMAL LOW (ref 135–145)
Total Bilirubin: 1.2 mg/dL (ref 0.0–1.2)
Total Protein: 6.8 g/dL (ref 6.5–8.1)

## 2024-02-01 MED ORDER — LORAZEPAM 2 MG/ML IJ SOLN: 1.0000 mg | Freq: Once | INTRAMUSCULAR | Status: DC | PRN

## 2024-02-01 MED ORDER — LORAZEPAM 2 MG/ML IJ SOLN
1.0000 mg | INTRAMUSCULAR | Status: AC | PRN
  Administered 2024-02-01: 2 mg via INTRAVENOUS
  Administered 2024-02-01 (×2): 3 mg via INTRAVENOUS
  Filled 2024-02-01: qty 1
  Filled 2024-02-01 (×2): qty 2

## 2024-02-01 MED ORDER — LORAZEPAM 2 MG/ML IJ SOLN: 1.0000 mg | Freq: Once | INTRAMUSCULAR | Status: DC

## 2024-02-01 MED ORDER — LORAZEPAM 2 MG/ML IJ SOLN: 1.0000 mg | INTRAMUSCULAR | Status: DC | PRN

## 2024-02-01 MED ORDER — LORAZEPAM 1 MG PO TABS
1.0000 mg | ORAL_TABLET | ORAL | Status: AC | PRN
  Administered 2024-02-01: 3 mg via ORAL
  Administered 2024-02-01 – 2024-02-02 (×2): 2 mg via ORAL
  Filled 2024-02-01 (×2): qty 2
  Filled 2024-02-01: qty 3

## 2024-02-01 MED ORDER — LORAZEPAM 1 MG PO TABS: 1.0000 mg | ORAL_TABLET | ORAL | Status: AC | PRN

## 2024-02-01 NOTE — TOC Initial Note (Signed)
 Transition of Care [facility]) - Initial/Assessment Note    Patient Details  Name: Austin Brown MRN: [id number] Date of Birth: September 29, 1970  Transition of [facility]) CM/SW Contact:    Bascom Service, RN Phone Number: 02/01/2024, 11:54 AM  Clinical Narrative:  d/c plan home. SA resources added to AVS.                 Expected Discharge Plan: Home/Self Care Barriers to Discharge: Continued Medical Work up   Patient Goals and CMS Choice            Expected Discharge Plan and Services                                              Prior Living Arrangements/Services                       Activities of Daily Living   ADL Screening (condition at time of admission) Independently performs ADLs?: Yes (appropriate for developmental age) Is the patient deaf or have difficulty hearing?: No Does the patient have difficulty seeing, even when wearing glasses/contacts?: No Does the patient have difficulty concentrating, remembering, or making decisions?: No  Permission Sought/Granted                  Emotional Assessment              Admission diagnosis:  Alcohol withdrawal (HCC) [F10.939] Hypokalemia [E87.6] Hypomagnesemia [E83.42] Alcohol abuse [F10.10] Hyponatremia [E87.1] Alcohol withdrawal delirium, acute, hyperactive (HCC) [F10.931] Patient Active Problem List   Diagnosis Date Noted   Chronic alcohol use 01/26/2024   Nausea and vomiting 01/26/2024   Diarrhea 01/26/2024   Chronic idiopathic thrombocytopenia (HCC) 01/26/2024   Lactic acidosis 01/26/2024   Hepatic steatosis 01/26/2024   Generalized anxiety disorder 01/26/2024   Transaminitis 01/26/2024   Marijuana smoker, episodic 01/26/2024   Continuous dependence on cigarette smoking 01/26/2024   Reactive airway disease 01/26/2024   Alcohol withdrawal delirium, acute, hyperactive (HCC) 01/26/2024   Alcohol withdrawal (HCC) 07/31/2023   Low blood pressure 07/31/2023   Electrolyte abnormality  07/31/2023   Nicotine  dependence 11/07/2021   Hyponatremia 11/06/2021   Alcohol abuse 11/06/2021   Hypokalemia 11/06/2021   Hypomagnesemia 11/06/2021   Essential hypertension 11/06/2021   Mild intermittent asthma without complication 11/06/2021   Prolonged QT interval 11/06/2021   PCP:  Clarice Nottingham, MD Pharmacy:   [facility] [address] [place] - [address] AT [facility] OF [address] [place] [address] Phone: [phone] Fax: [phone]     Social Drivers of Health (SDOH) Social History: SDOH Screenings   Food Insecurity: No Food Insecurity (01/26/2024)  Housing: Low Risk  (01/26/2024)  Transportation Needs: No Transportation Needs (01/26/2024)  Utilities: Not At Risk (01/26/2024)  Depression (PHQ2-9): High Risk (09/29/2023)  Social Connections: Moderately Isolated (07/31/2023)  Tobacco Use: High Risk (01/26/2024)   SDOH Interventions:     Readmission Risk Interventions     No data to display

## 2024-02-01 NOTE — Progress Notes (Signed)
 Pt non-complaint with cardiac monitoring. Pt  removed equipment and throw it on the floor. Repeatedly  trying to elope security to the bedside x 2. Education provided. Provider notified.

## 2024-02-01 NOTE — Plan of Care (Signed)
  Problem: Education: Goal: Knowledge of General Education information will improve Description: Including pain rating scale, medication(s)/side effects and non-pharmacologic comfort measures Outcome: Progressing   Problem: Safety: Goal: Ability to remain free from injury will improve Outcome: Progressing   Problem: Health Behavior/Discharge Planning: Goal: Ability to manage health-related needs will improve Outcome: Not Progressing

## 2024-02-01 NOTE — Progress Notes (Signed)
 PROGRESS NOTE    Austin Brown  FMW:[id number] DOB: 24-Jul-1970 DOA: 01/25/2024 PCP: Clarice Nottingham, MD   Brief Narrative:53 year old man medical history of asthma, essential hypertension, reactive airway disease, marijuana smoking, cigarette smoking, chronic alcohol use and chronic thrombocytopenia presented emergency department complaining of nausea, vomiting, diarrhea and abdominal discomfort.  He drinks alcohol on a daily basis up to 7 malt beers.  His blood alcohol level was 311 on admission.  Sodium was 125.  LFTs were elevated.  CIWA protocol was initiated with Ativan  and phenobarbital  was added, Librium  was started, he had to be transferred to stepdown unit and started Precedex  drip and now transferred out of the unit to the floor.  Assessment & Plan:   Principal Problem:   Alcohol withdrawal (HCC) Active Problems:   Chronic alcohol use   Lactic acidosis   Hyponatremia   Hypokalemia   Nausea and vomiting   Diarrhea   Chronic idiopathic thrombocytopenia (HCC)   Hepatic steatosis   Generalized anxiety disorder   Transaminitis   Marijuana smoker, episodic   Continuous dependence on cigarette smoking   Reactive airway disease   Alcohol withdrawal delirium, acute, hyperactive (HCC)   Alcohol withdrawal on admission his blood alcohol level was 311.  Urine drug screen was positive for THC benzos and barbiturates.(This was done on the 11th while he was on Ativan  and barbiturates.)  He was admitted with complaints of nausea vomiting diarrhea abdominal pain 4 days prior to admission.  He drinks about 8 malt beers daily.  Last drink was on the seventh evening.  He was admitted to the floor initially on phenobarbital  and CIWA protocol with Ativan .  He continued to get more delirious was unable to direct him on the floor he was transferred to the stepdown unit after 2 or 3 falls and hitting his head on the wall and a CT of this head shows no evidence of acute findings other than generalized  atrophy advanced for his age.  He was requiring high dose of Ativan  30 mg daily on the floor along with phenobarb.  Librium  25 mg 3 times daily. He was transferred out of stepdown unit on the 13th.  Precedex  was stopped.  Librium  Ativan  and phenobarb being continued.   Hyponatremia na 133 from 132 from 125 from 128 from 130 improved with normal saline.  Zoloft was on hold due to hyponatremia.  Labs pending for today.   Hypokalemia replete potassium.  Mag level 1.8.  pending labs Hypomagnesemia 2.0   Nausea, vomiting and diarrhea resolved -Patient reported nausea, vomiting and diarrhea with central chest pain for 3 to 4 days.  Reported known biliary nonbloody vomiting.  Has 1-2 episodes of loose stool on daily basis. Concern for nausea, vomiting and diarrhea in setting of marijuana use, alcohol withdrawal.  Patient is afebrile.  No evidence of leukocytosis.  Lactic acidosis secondary to poor hepatic clearance and type B lactic acidosis rather than elevation from any infectious source. It is also possible that patient might have some viral gastroenteritis.  GI panel negative   Lactic acidosis-type B -Initial lactic acid 4.1 which has been improved to 3.8 after receiving 1 L of LR bolus in the ED.  Type B lactic acidosis in the setting of poor hepatic clearance from underlying hepatic steatosis and chronic alcohol use related transaminitis.  Also metformin use causing lactic acidosis as well.   Generalized anxiety disorder -Holding Zoloft in the setting of hyponatremia   Transaminitis Hepatic steatosis improved   Per chart  review patient has chronic AST elevation and hyperbilirubinemia.  Bilirubin 1.3 today. - Transaminitis in the setting of hepatic steatosis and chronic alcohol use. - Negative hepatitis panel.  Continue to monitor hepatic function.   Essential hypertension -Continue Coreg  6.25 mg twice daily   Noncardiac chest pain continue Protonix  - Patient is complaining about mid  central chest pain.  Patient reported coughing causing the chest pain.  History of chronic smoking cigarette and reactive airway disease.  No established diagnosis of COPD. -Troponin x 2 within normal range.  EKG unremarkable.  At this time there is no concern for acute coronary syndrome.   -Noncardiac chest pain in the setting of pleural irritation from cough/gastritis     History of prediabetes - Holding metformin in setting of lactic acidosis.  Per chart review most recent A1c 4.9 in 09/2023.     History of prolonged QTc - EKG showing normal QTc interval at this time.   Chronic smoking cigarette History of reactive airway disease - Patient reported he used albuterol  inhaler as needed at home.  Continues to smoke cigarette on daily basis - Continue nicotine  patch. -  DuoNeb nebulizer as needed.   Estimated body mass index is 26.48 kg/m as calculated from the following:   Height as of this encounter: 5' 10 (1.778 m).   Weight as of this encounter: 83.7 kg.  DVT prophylaxis: lovenox  Code Status: full Family Communication:none Disposition Plan:  Status is: Inpatient Remains inpatient appropriate because: acute illness   Antimicrobials: none  Subjective:  Resting was restless last pm threw tele box on the floor  On ativan  librium  and phenobarb  Objective: Vitals:   01/31/24 2110 02/01/24 0049 02/01/24 0200 02/01/24 0451  BP: 134/87 132/85 (!) 147/97 (!) 138/98  Pulse: 91 100 83 83  Resp: 20   16  Temp: 99.2 F (37.3 C) 98.8 F (37.1 C) 98.7 F (37.1 C) 98.5 F (36.9 C)  TempSrc: Oral Oral Oral Oral  SpO2: 97% 96% 97% 100%  Weight:      Height:        Intake/Output Summary (Last 24 hours) at 02/01/2024 0901 Last data filed at 01/31/2024 1517 Gross per 24 hour  Intake 277.01 ml  Output --  Net 277.01 ml   Filed Weights   01/25/24 2026 01/30/24 0220 01/30/24 2333  Weight: 95.4 kg 90.4 kg 83.7 kg    Examination:  General exam:awake , condom cath in  place respiratory system: Clear to auscultation. Respiratory effort normal. Cardiovascular system:tachy Gastrointestinal system: Abdomen is nondistended, soft and nontender. No organomegaly or masses felt. Normal bowel sounds heard. Central nervous system: awake oriented to hospital Extremities: No edema  Data Reviewed: I have personally reviewed following labs and imaging studies  CBC: Recent Labs  Lab 01/25/24 2104 01/25/24 2112 01/27/24 0718 01/28/24 0559 01/29/24 0537 01/30/24 0250 01/31/24 0250  WBC 8.0  --  5.0 5.4 5.8 4.9 4.6  NEUTROABS 4.9  --   --   --   --   --   --   HGB 16.2   < > 14.1 13.1 12.5* 12.5* 12.7*  HCT 44.7   < > 40.2 38.1* 36.5* 37.5* 38.3*  MCV 99.3  --  103.3* 103.8* 105.2* 106.2* 107.6*  PLT 136*  --  80* 82* 86* 97* 104*   < > = values in this interval not displayed.   Basic Metabolic Panel: Recent Labs  Lab 01/25/24 2250 01/26/24 0435 01/26/24 9390 01/26/24 1246 01/27/24 0601 01/28/24 0559 01/29/24 9462  01/30/24 0250 01/31/24 0250  NA  --    < >  --    < > 128* 125* 132* 133* 133*  K  --    < >  --    < > 3.7 3.3* 3.6 3.3* 3.5  CL  --    < >  --    < > 90* 89* 100 101 103  CO2  --    < >  --    < > 29 27 22 24 22   GLUCOSE  --    < >  --    < > 116* 96 102* 125* 85  BUN  --    < >  --    < > 6 7 10 9 6   CREATININE  --    < >  --    < > 0.74 0.75 0.72 0.71 0.59*  CALCIUM  --    < >  --    < > 7.8* 7.9* 8.1* 8.4* 8.0*  MG 1.6*  --  1.8  --   --  1.8  --   --  2.0   < > = values in this interval not displayed.   GFR: Estimated Creatinine Clearance: 111.5 mL/min (A) (by C-G formula based on SCr of 0.59 mg/dL (L)). Liver Function Tests: Recent Labs  Lab 01/27/24 0601 01/28/24 0559 01/29/24 0537 01/30/24 0250 01/31/24 0250  AST 96* 76* 89* 86* 94*  ALT 35 32 35 38 38  ALKPHOS 123 116 129* 147* 114  BILITOT 2.4* 2.4* 1.8* 1.6* 1.5*  PROT 6.6 6.5 6.1* 6.6 6.4*  ALBUMIN 3.3* 3.1* 3.1* 3.3* 3.1*   Recent Labs  Lab 01/25/24 2104   LIPASE 49   No results for input(s): AMMONIA in the last 168 hours. Coagulation Profile: No results for input(s): INR, PROTIME in the last 168 hours. Cardiac Enzymes: No results for input(s): CKTOTAL, CKMB, CKMBINDEX, TROPONINI in the last 168 hours. BNP (last 3 results) No results for input(s): PROBNP in the last 8760 hours. HbA1C: No results for input(s): HGBA1C in the last 72 hours. CBG: No results for input(s): GLUCAP in the last 168 hours. Lipid Profile: No results for input(s): CHOL, HDL, LDLCALC, TRIG, CHOLHDL, LDLDIRECT in the last 72 hours. Thyroid  Function Tests: No results for input(s): TSH, T4TOTAL, FREET4, T3FREE, THYROIDAB in the last 72 hours. Anemia Panel: No results for input(s): VITAMINB12, FOLATE, FERRITIN, TIBC, IRON, RETICCTPCT in the last 72 hours. Sepsis Labs: Recent Labs  Lab 01/25/24 2250 01/26/24 0128 01/26/24 0650 01/26/24 1246  LATICACIDVEN 4.2* 3.8* 3.2* 2.9*    Recent Results (from the past 240 hours)  Gastrointestinal Panel by PCR , Stool     Status: None   Collection Time: 01/26/24 11:54 PM   Specimen: Stool  Result Value Ref Range Status   Campylobacter species NOT DETECTED NOT DETECTED Final   Plesimonas shigelloides NOT DETECTED NOT DETECTED Final   Salmonella species NOT DETECTED NOT DETECTED Final   Yersinia enterocolitica NOT DETECTED NOT DETECTED Final   Vibrio species NOT DETECTED NOT DETECTED Final   Vibrio cholerae NOT DETECTED NOT DETECTED Final   Enteroaggregative E coli (EAEC) NOT DETECTED NOT DETECTED Final   Enteropathogenic E coli (EPEC) NOT DETECTED NOT DETECTED Final   Enterotoxigenic E coli (ETEC) NOT DETECTED NOT DETECTED Final   Shiga like toxin producing E coli (STEC) NOT DETECTED NOT DETECTED Final   Shigella/Enteroinvasive E coli (EIEC) NOT DETECTED NOT DETECTED Final   Cryptosporidium NOT DETECTED NOT DETECTED  Final   Cyclospora cayetanensis NOT DETECTED NOT  DETECTED Final   Entamoeba histolytica NOT DETECTED NOT DETECTED Final   Giardia lamblia NOT DETECTED NOT DETECTED Final   Adenovirus F40/41 NOT DETECTED NOT DETECTED Final   Astrovirus NOT DETECTED NOT DETECTED Final   Norovirus GI/GII NOT DETECTED NOT DETECTED Final   Rotavirus A NOT DETECTED NOT DETECTED Final   Sapovirus (I, II, IV, and V) NOT DETECTED NOT DETECTED Final    Comment: Performed at [facility], [address] [place], [place] [address]  MRSA Next Gen by PCR, Nasal     Status: None   Collection Time: 01/30/24  1:05 AM   Specimen: Nasal Mucosa; Nasal Swab  Result Value Ref Range Status   MRSA by PCR Next Gen NOT DETECTED NOT DETECTED Final    Comment: (NOTE) The GeneXpert MRSA Assay (FDA approved for NASAL specimens only), is one component of a comprehensive MRSA colonization surveillance program. It is not intended to diagnose MRSA infection nor to guide or monitor treatment for MRSA infections. Test performance is not FDA approved in patients less than 68 years old. Performed at [facility], [address] [place], [place] [address]          Radiology Studies: CT HEAD WO CONTRAST ( ) Result Date: 01/30/2024 EXAM: CT HEAD WITHOUT 01/30/2024 09:17:44 AM TECHNIQUE: CT of the head was performed without the administration of intravenous contrast. Automated exposure control, iterative reconstruction, and/or weight based adjustment of the mA/kV was utilized to reduce the radiation dose to as low as reasonably achievable. COMPARISON: CT head without contrast 01/29/2014. CLINICAL HISTORY: Delirium. Scan due to delirium. Principal problem: alcohol withdrawal. Patient complains of diarrhea and chest pain. FINDINGS: BRAIN AND VENTRICLES: No acute intracranial hemorrhage. No mass effect or midline shift. No extra-axial fluid collection. Gray-white differentiation is maintained. No hydrocephalus. Mild generalized atrophy is somewhat advanced for  age. ORBITS: No acute abnormality. SINUSES AND MASTOIDS: No acute abnormality. SOFT TISSUES AND SKULL: No acute skull fracture. No acute soft tissue abnormality. IMPRESSION: 1. No acute intracranial abnormality. 2. Mild generalized atrophy, somewhat advanced for age. Electronically signed by: Lonni Necessary MD 01/30/2024 09:41 AM EDT RP Workstation: [id number]     Scheduled Meds:  carvedilol   6.25 mg Oral BID   chlordiazePOXIDE   25 mg Oral TID   Chlorhexidine  Gluconate Cloth  6 each Topical Daily   folic acid   1 mg Oral Daily   guaiFENesin   600 mg Oral BID   multivitamin with minerals  1 tablet Oral Daily   nicotine   21 mg Transdermal Daily   pantoprazole   40 mg Oral Daily   sodium chloride  flush  3 mL Intravenous Q12H   thiamine   100 mg Oral Daily   Or   thiamine   100 mg Intravenous Daily   Continuous Infusions:  sodium chloride  Stopped (01/31/24 0641)     LOS: 6 days    Time spent: 39 min    Almarie KANDICE Hoots, MD  02/01/2024, 9:01 AM

## 2024-02-02 DIAGNOSIS — F1010 Alcohol abuse, uncomplicated: Secondary | ICD-10-CM | POA: Diagnosis not present

## 2024-02-02 LAB — CBC
HCT: 37.9 % — ABNORMAL LOW (ref 39.0–52.0)
Hemoglobin: 12.4 g/dL — ABNORMAL LOW (ref 13.0–17.0)
MCH: 35 pg — ABNORMAL HIGH (ref 26.0–34.0)
MCHC: 32.7 g/dL (ref 30.0–36.0)
MCV: 107.1 fL — ABNORMAL HIGH (ref 80.0–100.0)
Platelets: 150 K/uL (ref 150–400)
RBC: 3.54 MIL/uL — ABNORMAL LOW (ref 4.22–5.81)
RDW: 13.7 % (ref 11.5–15.5)
WBC: 4.6 K/uL (ref 4.0–10.5)
nRBC: 0 % (ref 0.0–0.2)

## 2024-02-02 LAB — COMPREHENSIVE METABOLIC PANEL WITH GFR
ALT: 45 U/L — ABNORMAL HIGH (ref 0–44)
AST: 86 U/L — ABNORMAL HIGH (ref 15–41)
Albumin: 3.4 g/dL — ABNORMAL LOW (ref 3.5–5.0)
Alkaline Phosphatase: 145 U/L — ABNORMAL HIGH (ref 38–126)
Anion gap: 9 (ref 5–15)
BUN: 10 mg/dL (ref 6–20)
CO2: 24 mmol/L (ref 22–32)
Calcium: 9.3 mg/dL (ref 8.9–10.3)
Chloride: 101 mmol/L (ref 98–111)
Creatinine, Ser: 0.89 mg/dL (ref 0.61–1.24)
GFR, Estimated: >60 mL/min (ref >60)
Glucose, Bld: 99 mg/dL (ref 70–99)
Potassium: 3.6 mmol/L (ref 3.5–5.1)
Sodium: 134 mmol/L — ABNORMAL LOW (ref 135–145)
Total Bilirubin: 1.1 mg/dL (ref 0.0–1.2)
Total Protein: 7.4 g/dL (ref 6.5–8.1)

## 2024-02-02 MED ORDER — CHLORDIAZEPOXIDE HCL 25 MG PO CAPS: ORAL_CAPSULE | ORAL | 0 refills | Status: AC

## 2024-02-02 MED ORDER — ADULT MULTIVITAMIN W/MINERALS CH: 1.0000 | ORAL_TABLET | Freq: Every day | ORAL | Status: AC

## 2024-02-02 MED ORDER — FOLIC ACID 1 MG PO TABS: 1.0000 mg | ORAL_TABLET | Freq: Every day | ORAL | Status: AC

## 2024-02-02 MED ORDER — CHLORDIAZEPOXIDE HCL 25 MG PO CAPS: ORAL_CAPSULE | ORAL | 0 refills | Status: DC

## 2024-02-02 MED ORDER — PANTOPRAZOLE SODIUM 40 MG PO TBEC: 40.0000 mg | DELAYED_RELEASE_TABLET | Freq: Every day | ORAL | 0 refills | Status: AC

## 2024-02-02 NOTE — Plan of Care (Signed)

## 2024-02-02 NOTE — Discharge Summary (Signed)
 Physician Discharge Summary  Austin Brown Ben FMW:[id number] DOB: [birth year] DOA: 01/25/2024  PCP: Clarice Nottingham, MD  Admit date: 01/25/2024 Discharge date: 02/02/2024  Admitted From: home Disposition:  home Recommendations for Outpatient Follow-up:  Follow up with PCP in 1-2 weeks Please obtain BMP/CBC in one week  Home Health:none Equipment/Devices:none Discharge Condition:stable CODE STATUS:full Diet recommendation: cardiac Brief/Interim Summary:  53 year old man medical history of asthma, essential hypertension, reactive airway disease, marijuana smoking, cigarette smoking, chronic alcohol use and chronic thrombocytopenia presented emergency department complaining of nausea, vomiting, diarrhea and abdominal discomfort.  He drinks alcohol on a daily basis up to 7 malt beers.  His blood alcohol level was 311 on admission.  Sodium was 125.  LFTs were elevated.  CIWA protocol was initiated with Ativan  and phenobarbital  was added, Librium  was started, he had to be transferred to stepdown unit and started Precedex  drip and now transferred out of the unit to the floor.     Discharge Diagnoses:  Principal Problem:   Alcohol withdrawal (HCC) Active Problems:   Chronic alcohol use   Lactic acidosis   Hyponatremia   Hypokalemia   Nausea and vomiting   Diarrhea   Chronic idiopathic thrombocytopenia (HCC)   Hepatic steatosis   Generalized anxiety disorder   Transaminitis   Marijuana smoker, episodic   Continuous dependence on cigarette smoking   Reactive airway disease   Alcohol withdrawal delirium, acute, hyperactive (HCC)    Alcohol withdrawal on admission his blood alcohol level was 311.  Urine drug screen was positive for THC benzos and barbiturates.(This was done on the 11th while he was on Ativan  and barbiturates.)  He was admitted with complaints of nausea vomiting diarrhea abdominal pain 4 days prior to admission.  He drinks about 8 malt beers daily.  Last drink was on the seventh  evening.  He was admitted to the floor initially on phenobarbital  and CIWA protocol with Ativan .  He continued to get more delirious was unable to direct him on the floor he was transferred to the stepdown unit after 2 or 3 falls and hitting his head on the wall and a CT of this head shows no evidence of acute findings other than generalized atrophy advanced for his age.  He was requiring high dose of Ativan  30 mg daily on the floor along with phenobarb.  Librium  25 mg 3 times daily. He was transferred out of stepdown unit on the 13th.  Precedex  was stopped.  Librium  Ativan  and phenobarb being continued. He was discharged on librium .   Hyponatremia na 133 from 132 from 125 from 128 from 130 improved with normal saline.     Hypokalemia resolved  Nausea, vomiting and diarrhea resolved  GI panel negative   Lactic acidosis-type B -Initial lactic acid 4.1 which has been improved to 3.8 after receiving 1 L of LR bolus in the ED.  Type B lactic acidosis in the setting of poor hepatic clearance from underlying hepatic steatosis and chronic alcohol use related transaminitis.  Also metformin use causing lactic acidosis as well.   Generalized anxiety disorder- on zoloft   Transaminitis Hepatic steatosis improved   Per chart review patient has chronic AST elevation and hyperbilirubinemia.   - Transaminitis in the setting of hepatic steatosis and chronic alcohol use. - Negative hepatitis panel.   Essential hypertension -Continue Coreg  6.25   Noncardiac chest pain continue Protonix     History of prediabetes -on metformin     Estimated body mass index is 26.48 kg/m as calculated from  the following:   Height as of this encounter: 5' 10 (1.778 m).   Weight as of this encounter: 83.7 kg.  Discharge Instructions  Discharge Instructions     Diet - low sodium heart healthy   Complete by: As directed    Increase activity slowly   Complete by: As directed       Allergies as of 02/02/2024        Reactions   Amoxicillin Other (See Comments)   Reaction not cited        Medication List     STOP taking these medications    feeding supplement Liqd   nicotine  21 mg/24hr patch Commonly known as: NICODERM CQ  - dosed in mg/24 hours   Potassium Chloride  ER 20 MEQ Tbcr       TAKE these medications    albuterol  108 (90 Base) MCG/ACT inhaler Commonly known as: VENTOLIN  HFA Inhale 2 puffs into the lungs every 6 (six) hours as needed for wheezing or shortness of breath.   carvedilol  6.25 MG tablet Commonly known as: COREG  Take 6.25 mg by mouth 2 (two) times daily.   chlordiazePOXIDE  25 MG capsule Commonly known as: LIBRIUM  1 tablet 3 times a day for 5 days Then 1 tablet 2 times a day for 5 days Then 1 tablet daily till all tablets are done   FLUoxetine  40 MG capsule Commonly known as: PROZAC  Take 40 mg by mouth daily.   folic acid  1 MG tablet Commonly known as: FOLVITE  Take 1 tablet (1 mg total) by mouth daily.   metFORMIN 500 MG tablet Commonly known as: GLUCOPHAGE Take 500 mg by mouth daily.   multivitamin with minerals Tabs tablet Take 1 tablet by mouth daily.   pantoprazole  40 MG tablet Commonly known as: PROTONIX  Take 1 tablet (40 mg total) by mouth daily.   traZODone  100 MG tablet Commonly known as: DESYREL  Take 100 mg by mouth at bedtime as needed for sleep.        Allergies  Allergen Reactions   Amoxicillin Other (See Comments)    Reaction not cited    Consultations:none   Procedures/Studies: CT HEAD WO CONTRAST ( ) Result Date: 01/30/2024 EXAM: CT HEAD WITHOUT 01/30/2024 09:17:44 AM TECHNIQUE: CT of the head was performed without the administration of intravenous contrast. Automated exposure control, iterative reconstruction, and/or weight based adjustment of the mA/kV was utilized to reduce the radiation dose to as low as reasonably achievable. COMPARISON: CT head without contrast 01/29/2014. CLINICAL HISTORY: Delirium. Scan due to  delirium. Principal problem: alcohol withdrawal. Patient complains of diarrhea and chest pain. FINDINGS: BRAIN AND VENTRICLES: No acute intracranial hemorrhage. No mass effect or midline shift. No extra-axial fluid collection. Gray-white differentiation is maintained. No hydrocephalus. Mild generalized atrophy is somewhat advanced for age. ORBITS: No acute abnormality. SINUSES AND MASTOIDS: No acute abnormality. SOFT TISSUES AND SKULL: No acute skull fracture. No acute soft tissue abnormality. IMPRESSION: 1. No acute intracranial abnormality. 2. Mild generalized atrophy, somewhat advanced for age. Electronically signed by: Lonni Necessary MD 01/30/2024 09:41 AM EDT RP Workstation: [id number]   DG Chest 2 View Result Date: 01/25/2024 CLINICAL DATA:  Three day history of chest pain and shortness of breath chest EXAM: CHEST - 2 VIEW COMPARISON:  Radiograph dated 01/29/2014 FINDINGS: Normal lung volumes. Patchy medial right basilar opacity. No pleural effusion or pneumothorax. The heart size and mediastinal contours are within normal limits. No acute osseous abnormality. IMPRESSION: Patchy medial right basilar opacity, which may represent atelectasis or pneumonia. Electronically Signed  By: Limin  Xu M.D.   On: 01/25/2024 21:17   (Echo, Carotid, EGD, Colonoscopy, ERCP)    Subjective:  Up in chair ready to go Discharge Exam: Vitals:   02/02/24 0319 02/02/24 0402  BP: 135/80 (!) 141/97  Pulse: 85 80  Resp:  19  Temp:  98.9 F (37.2 C)  SpO2:  96%   Vitals:   02/01/24 1817 02/01/24 2012 02/02/24 0319 02/02/24 0402  BP: 131/85 (!) 129/93 135/80 (!) 141/97  Pulse: 82 88 85 80  Resp:  19  19  Temp:  98.9 F (37.2 C)  98.9 F (37.2 C)  TempSrc:  Oral  Oral  SpO2:  98%  96%  Weight:      Height:        General: Pt is alert, awake, not in acute distress Cardiovascular: reg Respiratory: CTA bilaterally, no wheezing, no rhonchi Abdominal: Soft, NT, ND, bowel sounds + Extremities: no  edema, no cyanosis    The results of significant diagnostics from this hospitalization (including imaging, microbiology, ancillary and laboratory) are listed below for reference.     Microbiology: Recent Results (from the past 240 hours)  Gastrointestinal Panel by PCR , Stool     Status: None   Collection Time: 01/26/24 11:54 PM   Specimen: Stool  Result Value Ref Range Status   Campylobacter species NOT DETECTED NOT DETECTED Final   Plesimonas shigelloides NOT DETECTED NOT DETECTED Final   Salmonella species NOT DETECTED NOT DETECTED Final   Yersinia enterocolitica NOT DETECTED NOT DETECTED Final   Vibrio species NOT DETECTED NOT DETECTED Final   Vibrio cholerae NOT DETECTED NOT DETECTED Final   Enteroaggregative E coli (EAEC) NOT DETECTED NOT DETECTED Final   Enteropathogenic E coli (EPEC) NOT DETECTED NOT DETECTED Final   Enterotoxigenic E coli (ETEC) NOT DETECTED NOT DETECTED Final   Shiga like toxin producing E coli (STEC) NOT DETECTED NOT DETECTED Final   Shigella/Enteroinvasive E coli (EIEC) NOT DETECTED NOT DETECTED Final   Cryptosporidium NOT DETECTED NOT DETECTED Final   Cyclospora cayetanensis NOT DETECTED NOT DETECTED Final   Entamoeba histolytica NOT DETECTED NOT DETECTED Final   Giardia lamblia NOT DETECTED NOT DETECTED Final   Adenovirus F40/41 NOT DETECTED NOT DETECTED Final   Astrovirus NOT DETECTED NOT DETECTED Final   Norovirus GI/GII NOT DETECTED NOT DETECTED Final   Rotavirus A NOT DETECTED NOT DETECTED Final   Sapovirus (I, II, IV, and V) NOT DETECTED NOT DETECTED Final    Comment: Performed at [facility], [address] [place], [place] [address]  MRSA Next Gen by PCR, Nasal     Status: None   Collection Time: 01/30/24  1:05 AM   Specimen: Nasal Mucosa; Nasal Swab  Result Value Ref Range Status   MRSA by PCR Next Gen NOT DETECTED NOT DETECTED Final    Comment: (NOTE) The GeneXpert MRSA Assay (FDA approved for NASAL specimens only), is one  component of a comprehensive MRSA colonization surveillance program. It is not intended to diagnose MRSA infection nor to guide or monitor treatment for MRSA infections. Test performance is not FDA approved in patients less than 27 years old. Performed at [facility], [address] [place], [place] [address]      Labs: BNP (last 3 results) No results for input(s): BNP in the last 8760 hours. Basic Metabolic Panel: Recent Labs  Lab 01/28/24 0559 01/29/24 0537 01/30/24 0250 01/31/24 0250 02/01/24 1055 02/02/24 0458  NA 125* 132* 133* 133* 133*  134*  K 3.3* 3.6 3.3* 3.5 3.2* 3.6  CL 89* 100 101 103 102 101  CO2 27 22 24 22 23 24   GLUCOSE 96 102* 125* 85 99 99  BUN 7 10 9 6 8 10   CREATININE 0.75 0.72 0.71 0.59* 0.69 0.89  CALCIUM 7.9* 8.1* 8.4* 8.0* 8.6* 9.3  MG 1.8  --   --  2.0  --   --    Liver Function Tests: Recent Labs  Lab 01/29/24 0537 01/30/24 0250 01/31/24 0250 02/01/24 1055 02/02/24 0458  AST 89* 86* 94* 76* 86*  ALT 35 38 38 41 45*  ALKPHOS 129* 147* 114 133* 145*  BILITOT 1.8* 1.6* 1.5* 1.2 1.1  PROT 6.1* 6.6 6.4* 6.8 7.4  ALBUMIN 3.1* 3.3* 3.1* 3.4* 3.4*   No results for input(s): LIPASE, AMYLASE in the last 168 hours. No results for input(s): AMMONIA in the last 168 hours. CBC: Recent Labs  Lab 01/29/24 0537 01/30/24 0250 01/31/24 0250 02/01/24 1055 02/02/24 0458  WBC 5.8 4.9 4.6 5.0 4.6  HGB 12.5* 12.5* 12.7* 12.7* 12.4*  HCT 36.5* 37.5* 38.3* 38.8* 37.9*  MCV 105.2* 106.2* 107.6* 109.0* 107.1*  PLT 86* 97* 104* 137* 150   Cardiac Enzymes: No results for input(s): CKTOTAL, CKMB, CKMBINDEX, TROPONINI in the last 168 hours. BNP: Invalid input(s): POCBNP CBG: No results for input(s): GLUCAP in the last 168 hours. D-Dimer No results for input(s): DDIMER in the last 72 hours. Hgb A1c No results for input(s): HGBA1C in the last 72 hours. Lipid Profile No results for input(s): CHOL, HDL,  LDLCALC, TRIG, CHOLHDL, LDLDIRECT in the last 72 hours. Thyroid  function studies No results for input(s): TSH, T4TOTAL, T3FREE, THYROIDAB in the last 72 hours.  Invalid input(s): FREET3 Anemia work up No results for input(s): VITAMINB12, FOLATE, FERRITIN, TIBC, IRON, RETICCTPCT in the last 72 hours. Urinalysis    Component Value Date/Time   COLORURINE YELLOW 01/29/2024 1052   APPEARANCEUR CLEAR 01/29/2024 1052   LABSPEC 1.013 01/29/2024 1052   PHURINE 8.0 01/29/2024 1052   GLUCOSEU NEGATIVE 01/29/2024 1052   HGBUR NEGATIVE 01/29/2024 1052   BILIRUBINUR NEGATIVE 01/29/2024 1052   KETONESUR 5 (A) 01/29/2024 1052   PROTEINUR NEGATIVE 01/29/2024 1052   UROBILINOGEN 0.2 08/27/2014 2131   NITRITE NEGATIVE 01/29/2024 1052   LEUKOCYTESUR NEGATIVE 01/29/2024 1052   Sepsis Labs Recent Labs  Lab 01/30/24 0250 01/31/24 0250 02/01/24 1055 02/02/24 0458  WBC 4.9 4.6 5.0 4.6   Microbiology Recent Results (from the past 240 hours)  Gastrointestinal Panel by PCR , Stool     Status: None   Collection Time: 01/26/24 11:54 PM   Specimen: Stool  Result Value Ref Range Status   Campylobacter species NOT DETECTED NOT DETECTED Final   Plesimonas shigelloides NOT DETECTED NOT DETECTED Final   Salmonella species NOT DETECTED NOT DETECTED Final   Yersinia enterocolitica NOT DETECTED NOT DETECTED Final   Vibrio species NOT DETECTED NOT DETECTED Final   Vibrio cholerae NOT DETECTED NOT DETECTED Final   Enteroaggregative E coli (EAEC) NOT DETECTED NOT DETECTED Final   Enteropathogenic E coli (EPEC) NOT DETECTED NOT DETECTED Final   Enterotoxigenic E coli (ETEC) NOT DETECTED NOT DETECTED Final   Shiga like toxin producing E coli (STEC) NOT DETECTED NOT DETECTED Final   Shigella/Enteroinvasive E coli (EIEC) NOT DETECTED NOT DETECTED Final   Cryptosporidium NOT DETECTED NOT DETECTED Final   Cyclospora cayetanensis NOT DETECTED NOT DETECTED Final   Entamoeba histolytica  NOT DETECTED NOT DETECTED Final  Giardia lamblia NOT DETECTED NOT DETECTED Final   Adenovirus F40/41 NOT DETECTED NOT DETECTED Final   Astrovirus NOT DETECTED NOT DETECTED Final   Norovirus GI/GII NOT DETECTED NOT DETECTED Final   Rotavirus A NOT DETECTED NOT DETECTED Final   Sapovirus (I, II, IV, and V) NOT DETECTED NOT DETECTED Final    Comment: Performed at [facility], [address] [place], [place] [address]  MRSA Next Gen by PCR, Nasal     Status: None   Collection Time: 01/30/24  1:05 AM   Specimen: Nasal Mucosa; Nasal Swab  Result Value Ref Range Status   MRSA by PCR Next Gen NOT DETECTED NOT DETECTED Final    Comment: (NOTE) The GeneXpert MRSA Assay (FDA approved for NASAL specimens only), is one component of a comprehensive MRSA colonization surveillance program. It is not intended to diagnose MRSA infection nor to guide or monitor treatment for MRSA infections. Test performance is not FDA approved in patients less than 75 years old. Performed at [facility], [address], [place], [place] [address]      Time coordinating discharge: 39 min  SIGNED:   Almarie KANDICE Hoots, MD  [facility] 02/02/2024, 3:07 PM

## 2024-02-02 NOTE — Progress Notes (Signed)
 IV's removed, dressed, instructions reviewed with understanding verbalized, transferred to front entrance via wheelchair.

## 2024-02-03 ENCOUNTER — Telehealth: Payer: Self-pay

## 2024-02-03 NOTE — Transitions of Care (Post Inpatient/ED Visit) (Signed)
   02/03/2024  Name: Austin Brown MRN: [id number] DOB: 07-04-1971  Today's TOC FU Call Status: Today's TOC FU Call Status:: Unsuccessful Call (1st Attempt) Unsuccessful Call (1st Attempt) Date: 02/03/24  Attempted to reach the patient regarding the most recent Inpatient/ED visit.  Follow Up Plan: Additional outreach attempts will be made to reach the patient to complete the Transitions of Care (Post Inpatient/ED visit) call.   Lacey Wallman J. Syrah Daughtrey RN, MSN [facility], [facility] RN Care Manager Direct Dial: [phone]  Fax: [phone] Website: [email or web address]

## 2024-02-04 ENCOUNTER — Telehealth: Payer: Self-pay

## 2024-02-04 NOTE — Transitions of Care (Post Inpatient/ED Visit) (Signed)
   02/04/2024  Name: Austin Brown MRN: [id number] DOB: [birth year]  Today's TOC FU Call Status: Today's TOC FU Call Status:: Unsuccessful Call (2nd Attempt) Unsuccessful Call (2nd Attempt) Date: 02/04/24  Attempted to reach the patient regarding the most recent Inpatient/ED visit.  Follow Up Plan: Additional outreach attempts will be made to reach the patient to complete the Transitions of Care (Post Inpatient/ED visit) call.   Jaslene Marsteller J. Shereen Marton RN, MSN [facility], [facility] RN Care Manager Direct Dial: [phone]  Fax: [phone] Website: [email or web address]

## 2024-02-05 ENCOUNTER — Telehealth: Payer: Self-pay

## 2024-02-05 NOTE — Transitions of Care (Post Inpatient/ED Visit) (Signed)
   02/05/2024  Name: Austin Brown MRN: [id number] DOB: 02/15/1971  Today's TOC FU Call Status: Today's TOC FU Call Status:: Unsuccessful Call (3rd Attempt) Unsuccessful Call (3rd Attempt) Date: 02/05/24  Attempted to reach the patient regarding the most recent Inpatient/ED visit.  Follow Up Plan: No further outreach attempts will be made at this time. We have been unable to contact the patient.  Tuwanna Krausz J. [place] Sarinana RN, MSN [facility], [facility] RN Care Manager Direct Dial: [phone]  Fax: [phone] Website: [email or web address]

## 2024-02-08 ENCOUNTER — Encounter: Payer: Self-pay | Admitting: Internal Medicine

## 2024-02-08 DIAGNOSIS — I10 Essential (primary) hypertension: Secondary | ICD-10-CM | POA: Diagnosis not present

## 2024-02-08 DIAGNOSIS — R7303 Prediabetes: Secondary | ICD-10-CM | POA: Diagnosis not present

## 2024-02-08 DIAGNOSIS — F10230 Alcohol dependence with withdrawal, uncomplicated: Secondary | ICD-10-CM | POA: Diagnosis not present

## 2024-02-08 DIAGNOSIS — E871 Hypo-osmolality and hyponatremia: Secondary | ICD-10-CM | POA: Diagnosis not present

## 2024-02-15 DIAGNOSIS — Z125 Encounter for screening for malignant neoplasm of prostate: Secondary | ICD-10-CM | POA: Diagnosis not present

## 2024-02-15 DIAGNOSIS — Z0000 Encounter for general adult medical examination without abnormal findings: Secondary | ICD-10-CM | POA: Diagnosis not present

## 2024-02-15 DIAGNOSIS — I10 Essential (primary) hypertension: Secondary | ICD-10-CM | POA: Diagnosis not present

## 2024-02-22 ENCOUNTER — Other Ambulatory Visit

## 2024-02-23 DIAGNOSIS — I10 Essential (primary) hypertension: Secondary | ICD-10-CM | POA: Diagnosis not present

## 2024-02-24 DIAGNOSIS — Z23 Encounter for immunization: Secondary | ICD-10-CM | POA: Diagnosis not present

## 2024-02-24 DIAGNOSIS — R748 Abnormal levels of other serum enzymes: Secondary | ICD-10-CM | POA: Diagnosis not present

## 2024-02-24 DIAGNOSIS — D539 Nutritional anemia, unspecified: Secondary | ICD-10-CM | POA: Diagnosis not present

## 2024-02-24 DIAGNOSIS — J45909 Unspecified asthma, uncomplicated: Secondary | ICD-10-CM | POA: Diagnosis not present

## 2024-02-24 DIAGNOSIS — L719 Rosacea, unspecified: Secondary | ICD-10-CM | POA: Diagnosis not present

## 2024-02-24 DIAGNOSIS — Z0000 Encounter for general adult medical examination without abnormal findings: Secondary | ICD-10-CM | POA: Diagnosis not present

## 2024-03-03 ENCOUNTER — Other Ambulatory Visit: Payer: Self-pay | Admitting: Gastroenterology

## 2024-03-03 DIAGNOSIS — F1010 Alcohol abuse, uncomplicated: Secondary | ICD-10-CM | POA: Diagnosis not present

## 2024-03-03 DIAGNOSIS — Z860100 Personal history of colon polyps, unspecified: Secondary | ICD-10-CM | POA: Diagnosis not present

## 2024-03-03 DIAGNOSIS — R748 Abnormal levels of other serum enzymes: Secondary | ICD-10-CM | POA: Diagnosis not present

## 2024-03-03 DIAGNOSIS — R7989 Other specified abnormal findings of blood chemistry: Secondary | ICD-10-CM

## 2024-03-03 DIAGNOSIS — K5730 Diverticulosis of large intestine without perforation or abscess without bleeding: Secondary | ICD-10-CM | POA: Diagnosis not present

## 2024-03-08 ENCOUNTER — Inpatient Hospital Stay
Admission: RE | Admit: 2024-03-08 | Discharge: 2024-03-08 | Discharge status: Home / Self Care | Source: Ambulatory Visit | Attending: Gastroenterology | Admitting: Gastroenterology

## 2024-03-08 DIAGNOSIS — R7401 Elevation of levels of liver transaminase levels: Secondary | ICD-10-CM | POA: Diagnosis not present

## 2024-03-08 DIAGNOSIS — R7989 Other specified abnormal findings of blood chemistry: Secondary | ICD-10-CM

## 2024-04-25 DIAGNOSIS — R7303 Prediabetes: Secondary | ICD-10-CM | POA: Diagnosis not present

## 2024-04-25 DIAGNOSIS — R748 Abnormal levels of other serum enzymes: Secondary | ICD-10-CM | POA: Diagnosis not present

## 2024-04-25 DIAGNOSIS — I10 Essential (primary) hypertension: Secondary | ICD-10-CM | POA: Diagnosis not present

## 2024-04-26 DIAGNOSIS — F1010 Alcohol abuse, uncomplicated: Secondary | ICD-10-CM | POA: Diagnosis not present

## 2024-04-26 DIAGNOSIS — E876 Hypokalemia: Secondary | ICD-10-CM | POA: Diagnosis not present

## 2024-04-28 DIAGNOSIS — F1010 Alcohol abuse, uncomplicated: Secondary | ICD-10-CM | POA: Diagnosis not present

## 2024-05-18 DIAGNOSIS — F1020 Alcohol dependence, uncomplicated: Secondary | ICD-10-CM | POA: Diagnosis not present

## 2024-05-24 DIAGNOSIS — E871 Hypo-osmolality and hyponatremia: Secondary | ICD-10-CM | POA: Diagnosis not present

## 2024-05-24 DIAGNOSIS — E876 Hypokalemia: Secondary | ICD-10-CM | POA: Diagnosis present

## 2024-05-24 DIAGNOSIS — R531 Weakness: Secondary | ICD-10-CM | POA: Diagnosis not present

## 2024-05-24 DIAGNOSIS — F1020 Alcohol dependence, uncomplicated: Secondary | ICD-10-CM | POA: Diagnosis not present

## 2024-05-26 ENCOUNTER — Encounter (HOSPITAL_COMMUNITY): Payer: Self-pay

## 2024-05-26 ENCOUNTER — Emergency Department (HOSPITAL_COMMUNITY): Admission: EM | Admit: 2024-05-26 | Discharge: 2024-05-26 | Disposition: A | Discharge status: Home / Self Care

## 2024-05-26 ENCOUNTER — Other Ambulatory Visit: Payer: Self-pay

## 2024-05-26 DIAGNOSIS — E871 Hypo-osmolality and hyponatremia: Secondary | ICD-10-CM | POA: Diagnosis not present

## 2024-05-26 DIAGNOSIS — R531 Weakness: Secondary | ICD-10-CM | POA: Diagnosis not present

## 2024-05-26 DIAGNOSIS — E876 Hypokalemia: Secondary | ICD-10-CM | POA: Insufficient documentation

## 2024-05-26 LAB — I-STAT CHEM 8, ED
BUN: 10 mg/dL (ref 6–20)
Calcium, Ion: 1.06 mmol/L — ABNORMAL LOW (ref 1.15–1.40)
Chloride: 95 mmol/L — ABNORMAL LOW (ref 98–111)
Creatinine, Ser: 0.7 mg/dL (ref 0.61–1.24)
Glucose, Bld: 111 mg/dL — ABNORMAL HIGH (ref 70–99)
HCT: 36 % — ABNORMAL LOW (ref 39.0–52.0)
Hemoglobin: 12.2 g/dL — ABNORMAL LOW (ref 13.0–17.0)
Potassium: 3.1 mmol/L — ABNORMAL LOW (ref 3.5–5.1)
Sodium: 137 mmol/L (ref 135–145)
TCO2: 33 mmol/L — ABNORMAL HIGH (ref 22–32)

## 2024-05-26 LAB — CBC WITH DIFFERENTIAL/PLATELET
Abs Immature Granulocytes: 0.03 K/uL (ref 0.00–0.07)
Basophils Absolute: 0 K/uL (ref 0.0–0.1)
Basophils Relative: 0 %
Eosinophils Absolute: 0.1 K/uL (ref 0.0–0.5)
Eosinophils Relative: 2 %
HCT: 35.4 % — ABNORMAL LOW (ref 39.0–52.0)
Hemoglobin: 11.9 g/dL — ABNORMAL LOW (ref 13.0–17.0)
Immature Granulocytes: 1 %
Lymphocytes Relative: 18 %
Lymphs Abs: 0.9 K/uL (ref 0.7–4.0)
MCH: 35.4 pg — ABNORMAL HIGH (ref 26.0–34.0)
MCHC: 33.6 g/dL (ref 30.0–36.0)
MCV: 105.4 fL — ABNORMAL HIGH (ref 80.0–100.0)
Monocytes Absolute: 0.8 K/uL (ref 0.1–1.0)
Monocytes Relative: 16 %
Neutro Abs: 3.1 K/uL (ref 1.7–7.7)
Neutrophils Relative %: 63 %
Platelets: 144 K/uL — ABNORMAL LOW (ref 150–400)
RBC: 3.36 MIL/uL — ABNORMAL LOW (ref 4.22–5.81)
RDW: 14.3 % (ref 11.5–15.5)
WBC: 4.9 K/uL (ref 4.0–10.5)
nRBC: 0 % (ref 0.0–0.2)

## 2024-05-26 LAB — BASIC METABOLIC PANEL WITH GFR
Anion gap: 12 (ref 5–15)
BUN: 9 mg/dL (ref 6–20)
CO2: 30 mmol/L (ref 22–32)
Calcium: 8.9 mg/dL (ref 8.9–10.3)
Chloride: 95 mmol/L — ABNORMAL LOW (ref 98–111)
Creatinine, Ser: 0.68 mg/dL (ref 0.61–1.24)
GFR, Estimated: >60 mL/min (ref >60)
Glucose, Bld: 109 mg/dL — ABNORMAL HIGH (ref 70–99)
Potassium: 3.2 mmol/L — ABNORMAL LOW (ref 3.5–5.1)
Sodium: 137 mmol/L (ref 135–145)

## 2024-05-26 MED ORDER — POTASSIUM CHLORIDE CRYS ER 20 MEQ PO TBCR: 20.0000 meq | EXTENDED_RELEASE_TABLET | Freq: Every day | ORAL | 0 refills | Status: AC

## 2024-05-26 MED ORDER — POTASSIUM CHLORIDE CRYS ER 20 MEQ PO TBCR
40.0000 meq | EXTENDED_RELEASE_TABLET | Freq: Once | ORAL | Status: AC
  Administered 2024-05-26: 40 meq via ORAL
  Filled 2024-05-26: qty 2

## 2024-05-26 MED ORDER — POTASSIUM CHLORIDE CRYS ER 20 MEQ PO TBCR: 20.0000 meq | EXTENDED_RELEASE_TABLET | Freq: Every day | ORAL | 0 refills | Status: DC

## 2024-05-26 NOTE — ED Provider Triage Note (Signed)
 Emergency Medicine Provider Triage Evaluation Note  Austin Brown , a 53 y.o. male  was evaluated in triage.  Pt complains of low potassium.  Has endorsed feeling weak.  No other complaints.  Blood work was obtained at [facility].  Review of Systems  Positive: As above Negative: As above  Physical Exam  BP 126/85   Pulse 84   Temp 98.2 F (36.8 C)   Resp 16   Ht 5' 10 (1.778 m)   Wt 105.2 kg   SpO2 93%   BMI 33.29 kg/m  Gen:   Awake, no distress   Resp:  Normal effort  MSK:   Moves extremities without difficulty  Other:    Medical Decision Making  Medically screening exam initiated at 12:33 PM.  Appropriate orders placed.  Austin Brown was informed that the remainder of the evaluation will be completed by another provider, this initial triage assessment does not replace that evaluation, and the importance of remaining in the ED until their evaluation is complete.     Hildegard Loge, PA-C 05/26/24 1234

## 2024-05-26 NOTE — ED Notes (Signed)
 PT educated about his current potassium level and transportation has been arranged for PT to be picked up by facility.PT is remaining beside charge desk until pick up.

## 2024-05-26 NOTE — ED Notes (Signed)
 Provided pt with sandwich and juice. Pt amb to RR

## 2024-05-26 NOTE — ED Notes (Signed)
 Pt verbalizes understanding of DC instructions. Pt belongings returned and is ambulatory out of ED.   [facility]

## 2024-05-26 NOTE — Discharge Instructions (Addendum)
 Please follow-up with your primary doctor.  We are prescribing you potassium pills to take.  Please follow-up with primary doctor regarding repeat potassium levels.  Return if you develop any new or worsening symptoms.

## 2024-05-26 NOTE — ED Provider Notes (Signed)
 [place] EMERGENCY DEPARTMENT AT [facility] Provider Note   CSN: [id number] Arrival date & time: 05/26/24  1213     Patient presents with: abnormal labs   Austin Brown is a 53 y.o. male.   This is a 53 year old male presenting emergency department for low potassium.  Reports feeling somewhat weak, but has no other complaints.  Reports that he used to be on potassium but that his PCP had taken him off potassium.  He has no other specific complaints.  No headache, vision loss, chest pain, shortness of breath or abdominal pain.        Prior to Admission medications   Medication Sig Start Date End Date Taking? Authorizing Provider  albuterol  (VENTOLIN  HFA) 108 (90 Base) MCG/ACT inhaler Inhale 2 puffs into the lungs every 6 (six) hours as needed for wheezing or shortness of breath. 05/31/21   [provider]  carvedilol  (COREG ) 6.25 MG tablet Take 6.25 mg by mouth 2 (two) times daily. 07/27/23   [provider]  chlordiazePOXIDE  (LIBRIUM ) 25 MG capsule 1 tablet 3 times a day for 5 days Then 1 tablet 2 times a day for 5 days Then 1 tablet daily till all tablets are done 02/02/24   Will Almarie MATSU, MD  FLUoxetine  (PROZAC ) 40 MG capsule Take 40 mg by mouth daily. 01/06/23   [provider]  folic acid  (FOLVITE ) 1 MG tablet Take 1 tablet (1 mg total) by mouth daily. 02/02/24   Will Almarie MATSU, MD  metFORMIN (GLUCOPHAGE) 500 MG tablet Take 500 mg by mouth daily. 03/31/23   [provider]  Multiple Vitamin (MULTIVITAMIN WITH MINERALS) TABS tablet Take 1 tablet by mouth daily. 02/02/24   Will Almarie MATSU, MD  pantoprazole  (PROTONIX ) 40 MG tablet Take 1 tablet (40 mg total) by mouth daily. 02/02/24   Will Almarie MATSU, MD  potassium chloride  SA (KLOR-CON  M) 20 MEQ tablet Take 1 tablet (20 mEq total) by mouth daily for 14 days. 05/26/24 06/09/24  Neysa Caron PARAS, DO  traZODone  (DESYREL ) 100 MG tablet Take 100 mg by mouth at bedtime as  needed for sleep. 10/14/21   [provider]    Allergies: Amoxicillin    Review of Systems  Updated Vital Signs BP (!) 135/94   Pulse 86   Temp 98.2 F (36.8 C)   Resp 15   Ht 5' 10 (1.778 m)   Wt 105.2 kg   SpO2 95%   BMI 33.29 kg/m   Physical Exam Vitals and nursing note reviewed.  Constitutional:      General: He is not in acute distress.    Appearance: He is obese. He is not toxic-appearing.  HENT:     Head: Normocephalic.     Mouth/Throat:     Mouth: Mucous membranes are moist.  Eyes:     Conjunctiva/sclera: Conjunctivae normal.  Cardiovascular:     Rate and Rhythm: Normal rate and regular rhythm.  Pulmonary:     Effort: Pulmonary effort is normal.     Breath sounds: Normal breath sounds.  Abdominal:     General: Abdomen is flat. There is no distension.     Palpations: Abdomen is soft.     Tenderness: There is no abdominal tenderness. There is no guarding or rebound.  Musculoskeletal:        General: Normal range of motion.  Skin:    General: Skin is warm and dry.     Capillary Refill: Capillary refill takes less than 2  seconds.  Neurological:     Mental Status: He is alert and oriented to person, place, and time.  Psychiatric:        Mood and Affect: Mood normal.        Behavior: Behavior normal.     (all labs ordered are listed, but only abnormal results are displayed) Labs Reviewed  CBC WITH DIFFERENTIAL/PLATELET - Abnormal; Notable for the following components:      Result Value   RBC 3.36 (*)    Hemoglobin 11.9 (*)    HCT 35.4 (*)    MCV 105.4 (*)    MCH 35.4 (*)    Platelets 144 (*)    All other components within normal limits  BASIC METABOLIC PANEL WITH GFR - Abnormal; Notable for the following components:   Potassium 3.2 (*)    Chloride 95 (*)    Glucose, Bld 109 (*)    All other components within normal limits  I-STAT CHEM 8, ED - Abnormal; Notable for the following components:   Potassium 3.1 (*)    Chloride 95 (*)     Glucose, Bld 111 (*)    Calcium, Ion 1.06 (*)    TCO2 33 (*)    Hemoglobin 12.2 (*)    HCT 36.0 (*)    All other components within normal limits    EKG: EKG Interpretation Date/Time:  Thursday May 26 2024 12:42:42 EST Ventricular Rate:  83 PR Interval:  176 QRS Duration:  84 QT Interval:  380 QTC Calculation: 446 R Axis:   37  Text Interpretation: Normal sinus rhythm Low voltage QRS Borderline ECG When compared with ECG of 25-Jan-2024 22:39, PREVIOUS ECG IS PRESENT Confirmed by Neysa Clap [phone]) on 05/26/2024 2:25:35 PM  Radiology: No results found.   Procedures   Medications Ordered in the ED  potassium chloride  SA (KLOR-CON  M) CR tablet 40 mEq (40 mEq Oral Given 05/26/24 1449)                                    Medical Decision Making Is a 53 year old male presenting emergency department for low potassium.  Is afebrile nontachycardic, normotensive.  Physical exam reassuring as no specific complaints.  No localizing deficits.  He is alert and oriented x 3.  Lab workup with low potassium.  Supposedly has a history of the same.  No kidney injury.  Stable anemia.  Given potassium here.  Will also give prescription and encouraged him to follow-up with his PCP.  Stable for discharge at this time.  Risk Prescription drug management.       Final diagnoses:  Hypokalemia    ED Discharge Orders          Ordered    potassium chloride  SA (KLOR-CON  M) 20 MEQ tablet  Daily,   Status:  Discontinued        05/26/24 1456    potassium chloride  SA (KLOR-CON  M) 20 MEQ tablet  Daily        05/26/24 1516               Neysa Clap PARAS, DO 05/26/24 1602

## 2024-05-26 NOTE — ED Triage Notes (Signed)
 C/O abnormal labs for CO2 and potassium. Facility states brief periods of confusion. Denies CP and SHOB.

## 2024-06-03 DIAGNOSIS — F1020 Alcohol dependence, uncomplicated: Secondary | ICD-10-CM | POA: Diagnosis not present

## 2024-06-10 DIAGNOSIS — F1020 Alcohol dependence, uncomplicated: Secondary | ICD-10-CM | POA: Diagnosis not present
